# Patient Record
Sex: Female | Born: 1940
Health system: Southern US, Community
[De-identification: ages and names within clinical notes are randomized; demographics above are authoritative.]

## PROBLEM LIST (undated history)

## (undated) DIAGNOSIS — N189 Chronic kidney disease, unspecified: Secondary | ICD-10-CM

## (undated) DIAGNOSIS — B019 Varicella without complication: Secondary | ICD-10-CM

## (undated) DIAGNOSIS — E785 Hyperlipidemia, unspecified: Secondary | ICD-10-CM

## (undated) DIAGNOSIS — K635 Polyp of colon: Secondary | ICD-10-CM

## (undated) DIAGNOSIS — K219 Gastro-esophageal reflux disease without esophagitis: Secondary | ICD-10-CM

## (undated) DIAGNOSIS — I10 Essential (primary) hypertension: Secondary | ICD-10-CM

## (undated) DIAGNOSIS — E119 Type 2 diabetes mellitus without complications: Secondary | ICD-10-CM

## (undated) DIAGNOSIS — J45909 Unspecified asthma, uncomplicated: Secondary | ICD-10-CM

## (undated) DIAGNOSIS — K5790 Diverticulosis of intestine, part unspecified, without perforation or abscess without bleeding: Secondary | ICD-10-CM

## (undated) HISTORY — DX: Varicella without complication: B01.9

## (undated) HISTORY — DX: Essential (primary) hypertension: I10

## (undated) HISTORY — DX: Diverticulosis of intestine, part unspecified, without perforation or abscess without bleeding: K57.90

## (undated) HISTORY — PX: BLADDER SURGERY: SHX569

## (undated) HISTORY — DX: Polyp of colon: K63.5

## (undated) HISTORY — DX: Unspecified asthma, uncomplicated: J45.909

## (undated) HISTORY — DX: Type 2 diabetes mellitus without complications: E11.9

## (undated) HISTORY — DX: Hyperlipidemia, unspecified: E78.5

## (undated) HISTORY — PX: EYE SURGERY: SHX253

---

## 1978-03-26 HISTORY — PX: URETHRAL DIVERTICULUM REPAIR: SHX5148

## 2012-06-16 LAB — HM COLONOSCOPY

## 2013-01-14 ENCOUNTER — Emergency Department: Payer: Self-pay | Admitting: Emergency Medicine

## 2013-01-14 LAB — URINALYSIS, COMPLETE
Bacteria: NONE SEEN
Bilirubin,UR: NEGATIVE
Blood: NEGATIVE
Glucose,UR: NEGATIVE mg/dL (ref 0–75)
Ketone: NEGATIVE
Nitrite: NEGATIVE
RBC,UR: <1 /HPF (ref 0–5)
Squamous Epithelial: <1
WBC UR: 4 /HPF (ref 0–5)

## 2013-01-14 LAB — CSF CELL COUNT WITH DIFFERENTIAL
CSF Tube #: 3
Eosinophil: 0 %
Lymphocytes: 50 %
Monocytes/Macrophages: 44 %
Other Cells: 0 %
WBC (CSF): 27 /mm3

## 2013-01-14 LAB — CSF CELL CT + PROT + GLU PANEL
Glucose, CSF: 56 mg/dL (ref 40–75)
Monocytes/Macrophages: 54 %
RBC (CSF): 21 /mm3
WBC (CSF): 11 /mm3

## 2013-01-14 LAB — COMPREHENSIVE METABOLIC PANEL
Albumin: 4.2 g/dL (ref 3.4–5.0)
Alkaline Phosphatase: 97 U/L (ref 50–136)
Anion Gap: 7 (ref 7–16)
BUN: 23 mg/dL — ABNORMAL HIGH (ref 7–18)
Calcium, Total: 9.7 mg/dL (ref 8.5–10.1)
Co2: 22 mmol/L (ref 21–32)
Creatinine: 1.01 mg/dL (ref 0.60–1.30)
EGFR (African American): >60
EGFR (Non-African Amer.): 56 — ABNORMAL LOW
Glucose: 143 mg/dL — ABNORMAL HIGH (ref 65–99)
Osmolality: 280 (ref 275–301)
Potassium: 4.1 mmol/L (ref 3.5–5.1)
SGPT (ALT): 19 U/L (ref 12–78)
Sodium: 137 mmol/L (ref 136–145)

## 2013-01-14 LAB — CBC
MCH: 31.3 pg (ref 26.0–34.0)
MCV: 89 fL (ref 80–100)

## 2013-01-14 LAB — PROTIME-INR: INR: 0.9

## 2013-01-17 LAB — CSF CULTURE W GRAM STAIN

## 2013-02-06 ENCOUNTER — Ambulatory Visit: Payer: Self-pay | Admitting: Podiatry

## 2013-04-22 ENCOUNTER — Encounter: Payer: Self-pay | Admitting: *Deleted

## 2013-05-05 ENCOUNTER — Encounter: Payer: Self-pay | Admitting: General Surgery

## 2013-05-05 ENCOUNTER — Ambulatory Visit (INDEPENDENT_AMBULATORY_CARE_PROVIDER_SITE_OTHER): Payer: Medicare Other | Admitting: General Surgery

## 2013-05-05 VITALS — BP 127/78 | HR 68 | Resp 12 | Ht 60.0 in | Wt 127.0 lb

## 2013-05-05 DIAGNOSIS — D480 Neoplasm of uncertain behavior of bone and articular cartilage: Secondary | ICD-10-CM

## 2013-05-05 DIAGNOSIS — M898X8 Other specified disorders of bone, other site: Secondary | ICD-10-CM

## 2013-05-05 NOTE — Progress Notes (Signed)
Patient ID: Felicia Roberts, female   DOB: Feb 27, 1941, 73 y.o.   MRN: [id number]  Chief Complaint  Patient presents with  . Other    New patient evaluation of suprasternal mass    HPI Felicia Roberts is a 73 y.o. female who presents for an evaluation of a suprasternal mass. The patient states this mass has been there since she was a child. She states the area has gotten larger overtime. She denies any pain in this area. No studies have been done in this area.    HPI  Past Medical History  Diagnosis Date  . Hypertension   . Diabetes mellitus without complication   . Asthma   . Hyperlipidemia   . Diverticulosis   . Coronary artery disease     Past Surgical History  Procedure Laterality Date  . Bladder surgery      Family History  Problem Relation Age of Onset  . Cancer Father     prostate    Social History History  Substance Use Topics  . Smoking status: Never Smoker   . Smokeless tobacco: Never Used  . Alcohol Use: No    No Known Allergies  Current Outpatient Prescriptions  Medication Sig Dispense Refill  . amLODipine (NORVASC) 5 MG tablet Take 5 mg by mouth daily.      Marland Kitchen glyBURIDE (DIABETA) 5 MG tablet Take 5 mg by mouth 2 (two) times daily.      Marland Kitchen lisinopril (PRINIVIL,ZESTRIL) 40 MG tablet Take 40 mg by mouth daily.      Marland Kitchen METFORMIN HCL PO Take 500 mg by mouth 2 (two) times daily.       . pravastatin (PRAVACHOL) 40 MG tablet Take 40 mg by mouth daily.       No current facility-administered medications for this visit.    Review of Systems Review of Systems  Constitutional: Negative.   Respiratory: Negative.   Cardiovascular: Negative.     Blood pressure 127/78, pulse 68, resp. rate 12, height 5' (1.524 m), weight 127 lb (57.607 kg).  Physical Exam Physical Exam  Constitutional: She is oriented to person, place, and time. She appears well-developed and well-nourished.  Eyes: Conjunctivae are normal. No scleral icterus.  Neck: Neck supple. No  thyromegaly present.  Cardiovascular: Normal rate, regular rhythm and normal heart sounds.   No murmur heard. Pulmonary/Chest: Effort normal and breath sounds normal.  8 cm x 7cm bony mass involving the upper half of the body of the sternum. Markedly visible.   Abdominal: Soft. Normal appearance and bowel sounds are normal. There is no hepatosplenomegaly. There is no tenderness. No hernia.  Lymphadenopathy:    She has no cervical adenopathy.  Neurological: She is alert and oriented to person, place, and time.  Skin: Skin is warm and dry.    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 Patient appears to be asymptomatic from this.     Plan    Recommend a CT scan to assess this area.      Patient has been scheduled for a CT chest with contrast at [place] for 05-11-13 at 10:30 am (arrive 10 am). Prep: hold metformin day of scan and 48 hours after and take medication list. Patient verbalizes understanding.   SANKAR,SEEPLAPUTHUR G 05/05/2013, 12:53 PM

## 2013-05-05 NOTE — Patient Instructions (Addendum)
Patient to be scheduled for a chest CT scan.   Patient has been scheduled for a CT chest with contrast at [place] for 05-11-13 at 10:30 am (arrive 10 am). Prep: hold metformin day of scan and 48 hours after and take medication list. Patient verbalizes understanding.

## 2013-05-11 ENCOUNTER — Ambulatory Visit: Payer: Self-pay | Admitting: General Surgery

## 2013-05-13 ENCOUNTER — Telehealth: Payer: Self-pay | Admitting: General Surgery

## 2013-05-13 NOTE — Telephone Encounter (Signed)
Pt advised on CT chest report. No bony abnormality of sternum and no mediastinal mass. Pt does not need any further imaging for this . Pt voiced understanding. F/u here prn.

## 2013-07-08 ENCOUNTER — Ambulatory Visit: Payer: Self-pay | Admitting: Nurse Practitioner

## 2014-01-25 ENCOUNTER — Encounter: Payer: Self-pay | Admitting: General Surgery

## 2015-04-18 ENCOUNTER — Ambulatory Visit (INDEPENDENT_AMBULATORY_CARE_PROVIDER_SITE_OTHER): Payer: Medicare HMO | Admitting: Physician Assistant

## 2015-04-18 ENCOUNTER — Encounter: Payer: Self-pay | Admitting: Physician Assistant

## 2015-04-18 VITALS — BP 132/84 | HR 84 | Temp 98.7°F | Resp 16 | Ht 59.0 in | Wt 130.0 lb

## 2015-04-18 DIAGNOSIS — IMO0002 Reserved for concepts with insufficient information to code with codable children: Secondary | ICD-10-CM | POA: Insufficient documentation

## 2015-04-18 DIAGNOSIS — J4520 Mild intermittent asthma, uncomplicated: Secondary | ICD-10-CM | POA: Diagnosis not present

## 2015-04-18 DIAGNOSIS — E785 Hyperlipidemia, unspecified: Secondary | ICD-10-CM | POA: Insufficient documentation

## 2015-04-18 DIAGNOSIS — E7800 Pure hypercholesterolemia, unspecified: Secondary | ICD-10-CM

## 2015-04-18 DIAGNOSIS — I10 Essential (primary) hypertension: Secondary | ICD-10-CM

## 2015-04-18 DIAGNOSIS — E1129 Type 2 diabetes mellitus with other diabetic kidney complication: Secondary | ICD-10-CM | POA: Insufficient documentation

## 2015-04-18 DIAGNOSIS — R7989 Other specified abnormal findings of blood chemistry: Secondary | ICD-10-CM

## 2015-04-18 DIAGNOSIS — R946 Abnormal results of thyroid function studies: Secondary | ICD-10-CM | POA: Diagnosis not present

## 2015-04-18 DIAGNOSIS — E1165 Type 2 diabetes mellitus with hyperglycemia: Secondary | ICD-10-CM

## 2015-04-18 DIAGNOSIS — J45909 Unspecified asthma, uncomplicated: Secondary | ICD-10-CM | POA: Insufficient documentation

## 2015-04-18 DIAGNOSIS — E119 Type 2 diabetes mellitus without complications: Secondary | ICD-10-CM | POA: Diagnosis not present

## 2015-04-18 NOTE — Patient Instructions (Signed)

## 2015-04-18 NOTE — Progress Notes (Signed)
Patient ID: Felicia Roberts, female   DOB: [birth year], 75 y.o.   MRN: [id number]       Patient: Felicia Roberts Female    DOB: [birth year]   75 y.o.   MRN: [id number] Visit Date: 04/18/2015  Today's Provider: Mar Daring, PA-C   Chief Complaint  Patient presents with  . New Patient (Initial Visit)   Subjective:    HPI  Patient is here to establish care, pt reports she was being seen by [place] NP at [facility]. Patient reports she was taking Prednisone for 4 days last week. Patient reports she was given prednisone in [place] about 4 years ago.    Diabetes Mellitus Type II, Follow-up:   No results found for: HGBA1C  Last seen for diabetes 3 months ago.  Management since then includes A1c per pt. She reports excellent compliance with treatment. She is not having side effects.  Current symptoms include polyuria and have been stable. Home blood sugar records: fasting range: not being checked  Episodes of hypoglycemia? no   Current Insulin Regimen: none Most Recent Eye Exam: 10/2014 [facility] Weight trend: stable Prior visit with dietician: no Current diet: in general, a "healthy" diet   Current exercise: walking  Pertinent Labs:    Component Value Date/Time   CREATININE 1.01 01/14/2013 0942    Wt Readings from Last 3 Encounters:  04/18/15 130 lb (58.968 kg)  05/05/13 127 lb (57.607 kg)    ------------------------------------------------------------------------   Hypertension, follow-up:  BP Readings from Last 3 Encounters:  04/18/15 132/84  05/05/13 127/78    She was last seen for hypertension 3 months ago.  BP at that visit was. Management changes since that visit include none. She reports excellent compliance with treatment. She is not having side effects.  She is exercising. She is adherent to low salt diet.   Outside blood pressures are are not being checked. She is experiencing none.  Patient denies chest pain.   Cardiovascular  risk factors include diabetes mellitus.  Use of agents associated with hypertension: none.     Weight trend: stable Wt Readings from Last 3 Encounters:  04/18/15 130 lb (58.968 kg)  05/05/13 127 lb (57.607 kg)    Current diet: in general, a "healthy" diet    ------------------------------------------------------------------------    No Known Allergies Previous Medications   ALBUTEROL (PROVENTIL HFA;VENTOLIN HFA) 108 (90 BASE) MCG/ACT INHALER    Inhale 1-2 puffs into the lungs every 6 (six) hours as needed for wheezing or shortness of breath.   AMLODIPINE (NORVASC) 5 MG TABLET    Take 5 mg by mouth daily.   FLUTICASONE-SALMETEROL (ADVAIR) 500-50 MCG/DOSE AEPB    Inhale 1 puff into the lungs 2 (two) times daily.   GLYBURIDE (DIABETA) 5 MG TABLET    Take 5 mg by mouth 2 (two) times daily.   LINAGLIPTIN (TRADJENTA) 5 MG TABS TABLET    Take 5 mg by mouth daily.   LISINOPRIL (PRINIVIL,ZESTRIL) 20 MG TABLET    Take 20 mg by mouth daily.   PRAVASTATIN (PRAVACHOL) 40 MG TABLET    Take 40 mg by mouth daily.    Review of Systems  Constitutional: Negative.   HENT: Negative.   Eyes: Negative.   Respiratory: Negative.   Cardiovascular: Negative.   Gastrointestinal: Negative.   Endocrine: Negative.   Genitourinary: Negative.   Musculoskeletal: Negative.   Allergic/Immunologic: Negative.   Neurological: Negative.   Hematological: Negative.   Psychiatric/Behavioral: Negative.     Social History  Substance  Use Topics  . Smoking status: Never Smoker   . Smokeless tobacco: Never Used  . Alcohol Use: No   Objective:   BP 132/84 mmHg  Pulse 84  Temp(Src) 98.7 F (37.1 C) (Oral)  Resp 16  Ht 4\' 11"  (1.499 m)  Wt 130 lb (58.968 kg)  BMI 26.24 kg/m2  SpO2 98%  Physical Exam  Constitutional: She is oriented to person, place, and time. She appears well-developed and well-nourished. No distress.  HENT:  Head: Normocephalic and atraumatic.  Right Ear: External ear normal.  Left  Ear: External ear normal.  Nose: Nose normal.  Mouth/Throat: Oropharynx is clear and moist. No oropharyngeal exudate.  Eyes: Conjunctivae and EOM are normal. Pupils are equal, round, and reactive to light. Right eye exhibits no discharge. Left eye exhibits no discharge. No scleral icterus.  Neck: Normal range of motion. Neck supple. No JVD present. No tracheal deviation present. No thyromegaly present.  Cardiovascular: Normal rate, regular rhythm, normal heart sounds and intact distal pulses.  Exam reveals no gallop and no friction rub.   No murmur heard. Pulmonary/Chest: Effort normal and breath sounds normal. No respiratory distress. She has no wheezes. She has no rales. She exhibits no tenderness.  Abdominal: Soft. Bowel sounds are normal. She exhibits no distension and no mass. There is no tenderness. There is no rebound and no guarding.  Musculoskeletal: Normal range of motion. She exhibits no edema or tenderness.  Lymphadenopathy:    She has no cervical adenopathy.  Neurological: She is alert and oriented to person, place, and time.  Skin: Skin is warm and dry. No rash noted. She is not diaphoretic.  Psychiatric: She has a normal mood and affect. Her behavior is normal. Judgment and thought content normal.  Vitals reviewed.       Assessment & Plan:     1. Abnormal thyroid blood test She states that she was previously told she had abnormal thyroid tests and her doctor tried to start her on a medication. She has not been taking this medication regularly and would like to have her thyroid levels rechecked to see if this medication is necessary. I have ordered thyroid as below and will follow-up pending these lab results. She is to call the office if she has any issues in the meantime. Also I'll see her back in approximately 3 months for her complete physical exam. - TSH  2. Hypercholesterolemia She has been stable on pravastatin 40 mg but does admit to not taking this medication  regularly as she states she forgets to take it. I will check a cholesterol panel as below and follow-up pending results. In the meantime she is to continue her current medical treatment plan of pravastatin 40 mg. - Lipid panel  3. Type 2 diabetes mellitus without complication, without long-term current use of insulin (HCC) She states she has been stable on Tradjenta 5 mg and with Glyburide 5 mg. She states she was recently put on Tradjenta approximately 3 months ago. I will check her labs as below and follow-up pending lab results. She is to continue her current medical treatment as above unless otherwise noted once I receive her lab results. I will see her back in approximately 3 months for her complete physical exam. She is to call the office if she has any acute issues, questions or concerns in the meantime. - HgB A1c - Comprehensive Metabolic Panel (CMET) - CBC with Differential  4. Essential hypertension Currently stable on Norvasc 5 mg and lisinopril 20  mg. I will check labs as below and follow-up pending lab results. She is to continue her current medical treatment plan. - Comprehensive Metabolic Panel (CMET) - CBC with Differential  5. Asthma, mild intermittent, uncomplicated Currently controlled. She states she occasionally has exacerbations that required oral prednisone. She will take 10 mg as needed for her flares. She also has an albuterol inhaler for which she uses as needed. She is to call the office if she has any acute issue, exacerbation, question or concern.       Mar Daring, PA-C  [facility]

## 2015-04-19 DIAGNOSIS — I10 Essential (primary) hypertension: Secondary | ICD-10-CM | POA: Diagnosis not present

## 2015-04-19 DIAGNOSIS — E7800 Pure hypercholesterolemia, unspecified: Secondary | ICD-10-CM | POA: Diagnosis not present

## 2015-04-19 DIAGNOSIS — E119 Type 2 diabetes mellitus without complications: Secondary | ICD-10-CM | POA: Diagnosis not present

## 2015-04-19 DIAGNOSIS — R946 Abnormal results of thyroid function studies: Secondary | ICD-10-CM | POA: Diagnosis not present

## 2015-04-20 ENCOUNTER — Other Ambulatory Visit: Payer: Self-pay | Admitting: Physician Assistant

## 2015-04-20 ENCOUNTER — Telehealth: Payer: Self-pay

## 2015-04-20 DIAGNOSIS — I10 Essential (primary) hypertension: Secondary | ICD-10-CM

## 2015-04-20 DIAGNOSIS — J45909 Unspecified asthma, uncomplicated: Secondary | ICD-10-CM

## 2015-04-20 LAB — COMPREHENSIVE METABOLIC PANEL
ALBUMIN: 4.5 g/dL (ref 3.5–4.8)
ALK PHOS: 90 IU/L (ref 39–117)
ALT: 13 IU/L (ref 0–32)
AST: 20 IU/L (ref 0–40)
Albumin/Globulin Ratio: 1.9 (ref 1.1–2.5)
BILIRUBIN TOTAL: 1.3 mg/dL — AB (ref 0.0–1.2)
BUN / CREAT RATIO: 14 (ref 11–26)
BUN: 16 mg/dL (ref 8–27)
CHLORIDE: 99 mmol/L (ref 96–106)
CO2: 21 mmol/L (ref 18–29)
CREATININE: 1.14 mg/dL — AB (ref 0.57–1.00)
Calcium: 9.9 mg/dL (ref 8.7–10.3)
GFR calc Af Amer: 55 mL/min/{1.73_m2} — ABNORMAL LOW (ref >59)
GFR calc non Af Amer: 47 mL/min/{1.73_m2} — ABNORMAL LOW (ref >59)
GLUCOSE: 210 mg/dL — AB (ref 65–99)
Globulin, Total: 2.4 g/dL (ref 1.5–4.5)
Potassium: 4.9 mmol/L (ref 3.5–5.2)
Sodium: 136 mmol/L (ref 134–144)
Total Protein: 6.9 g/dL (ref 6.0–8.5)

## 2015-04-20 LAB — CBC WITH DIFFERENTIAL/PLATELET
BASOS ABS: 0.1 10*3/uL (ref 0.0–0.2)
Basos: 1 %
EOS (ABSOLUTE): 0.3 10*3/uL (ref 0.0–0.4)
EOS: 6 %
HEMOGLOBIN: 13.8 g/dL (ref 11.1–15.9)
Hematocrit: 41.7 % (ref 34.0–46.6)
IMMATURE GRANULOCYTES: 0 %
Immature Grans (Abs): 0 10*3/uL (ref 0.0–0.1)
LYMPHS ABS: 2.1 10*3/uL (ref 0.7–3.1)
Lymphs: 41 %
MCH: 30.1 pg (ref 26.6–33.0)
MCHC: 33.1 g/dL (ref 31.5–35.7)
MCV: 91 fL (ref 79–97)
MONOCYTES: 7 %
MONOS ABS: 0.4 10*3/uL (ref 0.1–0.9)
NEUTROS PCT: 45 %
Neutrophils Absolute: 2.3 10*3/uL (ref 1.4–7.0)
Platelets: 393 10*3/uL — ABNORMAL HIGH (ref 150–379)
RBC: 4.59 x10E6/uL (ref 3.77–5.28)
RDW: 13.2 % (ref 12.3–15.4)
WBC: 5.2 10*3/uL (ref 3.4–10.8)

## 2015-04-20 LAB — LIPID PANEL
CHOLESTEROL TOTAL: 215 mg/dL — AB (ref 100–199)
Chol/HDL Ratio: 2.9 ratio units (ref 0.0–4.4)
HDL: 74 mg/dL (ref >39)
LDL Calculated: 110 mg/dL — ABNORMAL HIGH (ref 0–99)
TRIGLYCERIDES: 154 mg/dL — AB (ref 0–149)
VLDL Cholesterol Cal: 31 mg/dL (ref 5–40)

## 2015-04-20 LAB — HEMOGLOBIN A1C
ESTIMATED AVERAGE GLUCOSE: 266 mg/dL
HEMOGLOBIN A1C: 10.9 % — AB (ref 4.8–5.6)

## 2015-04-20 LAB — TSH: TSH: 2.34 u[IU]/mL (ref 0.450–4.500)

## 2015-04-20 MED ORDER — AMLODIPINE BESYLATE 5 MG PO TABS: 5.0000 mg | ORAL_TABLET | Freq: Every day | ORAL | Status: DC

## 2015-04-20 MED ORDER — LISINOPRIL 20 MG PO TABS: 20.0000 mg | ORAL_TABLET | Freq: Every day | ORAL | Status: DC

## 2015-04-20 MED ORDER — ALBUTEROL SULFATE HFA 108 (90 BASE) MCG/ACT IN AERS: 1.0000 | INHALATION_SPRAY | Freq: Four times a day (QID) | RESPIRATORY_TRACT | Status: DC | PRN

## 2015-04-20 NOTE — Telephone Encounter (Signed)
Pt stated she forgot to ask for refills when she was here for her OV earlier this week. Pt would like the following RXs to be sent to [facility] on [address].   amLODipine (NORVASC) 5 MG tablet,lisinopril (PRINIVIL,ZESTRIL) 20 MG tablet,lisinopril (PRINIVIL,ZESTRIL) 20 MG tablet, & proair. Thanks TNP

## 2015-04-20 NOTE — Telephone Encounter (Signed)
Pt advised as below. Patient will schedule appt. sd

## 2015-04-20 NOTE — Telephone Encounter (Signed)
-----   Message from Mar Daring, [place] sent at 04/20/2015  2:08 PM EST ----- I would like for her to come back in to discuss her blood sugar results and options for treatment.  Thanks.

## 2015-04-26 ENCOUNTER — Ambulatory Visit (INDEPENDENT_AMBULATORY_CARE_PROVIDER_SITE_OTHER): Payer: Medicare HMO | Admitting: Physician Assistant

## 2015-04-26 ENCOUNTER — Encounter: Payer: Self-pay | Admitting: Physician Assistant

## 2015-04-26 VITALS — BP 120/70 | HR 81 | Temp 98.0°F | Resp 16 | Wt 130.6 lb

## 2015-04-26 DIAGNOSIS — J45909 Unspecified asthma, uncomplicated: Secondary | ICD-10-CM | POA: Diagnosis not present

## 2015-04-26 DIAGNOSIS — E119 Type 2 diabetes mellitus without complications: Secondary | ICD-10-CM | POA: Diagnosis not present

## 2015-04-26 DIAGNOSIS — I10 Essential (primary) hypertension: Secondary | ICD-10-CM

## 2015-04-26 MED ORDER — GLYBURIDE 5 MG PO TABS: 5.0000 mg | ORAL_TABLET | Freq: Two times a day (BID) | ORAL | Status: DC

## 2015-04-26 MED ORDER — ALBUTEROL SULFATE HFA 108 (90 BASE) MCG/ACT IN AERS: 1.0000 | INHALATION_SPRAY | Freq: Four times a day (QID) | RESPIRATORY_TRACT | Status: DC | PRN

## 2015-04-26 MED ORDER — SITAGLIPTIN PHOSPHATE 100 MG PO TABS: 100.0000 mg | ORAL_TABLET | Freq: Every day | ORAL | Status: DC

## 2015-04-26 MED ORDER — LISINOPRIL 20 MG PO TABS: 20.0000 mg | ORAL_TABLET | Freq: Every day | ORAL | Status: DC

## 2015-04-26 NOTE — Patient Instructions (Signed)
Sitagliptin oral tablet °What is this medicine? °SITAGLIPTIN (sit a GLIP tin) helps to treat type 2 diabetes. It helps to control blood sugar. Treatment is combined with diet and exercise. °This medicine may be used for other purposes; ask your health care provider or pharmacist if you have questions. °What should I tell my health care provider before I take this medicine? °They need to know if you have any of these conditions: °-diabetic ketoacidosis °-kidney disease °-pancreatitis °-previous swelling of the tongue, face, or lips with difficulty breathing, difficulty swallowing, hoarseness, or tightening of the throat °-type 1 diabetes °-an unusual or allergic reaction to sitagliptin, other medicines, foods, dyes, or preservatives °-pregnant or trying to get pregnant °-breast-feeding °How should I use this medicine? °Take this medicine by mouth with a glass of water. Follow the directions on the prescription label. You can take it with or without food. Do not cut, crush or chew this medicine. Take your dose at the same time each day. Do not take more often than directed. Do not stop taking except on your doctor's advice. °Talk to your pediatrician regarding the use of this medicine in children. Special care may be needed. °Overdosage: If you think you have taken too much of this medicine contact a poison control center or emergency room at once. °NOTE: This medicine is only for you. Do not share this medicine with others. °What if I miss a dose? °If you miss a dose, take it as soon as you can. If it is almost time for your next dose, take only that dose. Do not take double or extra doses. °What may interact with this medicine? °Do not take this medicine with any of the following medications: °-gatifloxacin °This medicine may also interact with the following medications: °-alcohol °-digoxin °-insulin °-sulfonylureas like glimepiride, glipizide, glyburide °This list may not describe all possible interactions. Give  your health care provider a list of all the medicines, herbs, non-prescription drugs, or dietary supplements you use. Also tell them if you smoke, drink alcohol, or use illegal drugs. Some items may interact with your medicine. °What should I watch for while using this medicine? °Visit your doctor or health care professional for regular checks on your progress. °A test called the HbA1C (A1C) will be monitored. This is a simple blood test. It measures your blood sugar control over the last 2 to 3 months. You will receive this test every 3 to 6 months. °Learn how to check your blood sugar. Learn the symptoms of low and high blood sugar and how to manage them. °Always carry a quick-source of sugar with you in case you have symptoms of low blood sugar. Examples include hard sugar candy or glucose tablets. Make sure others know that you can choke if you eat or drink when you develop serious symptoms of low blood sugar, such as seizures or unconsciousness. They must get medical help at once. °Tell your doctor or health care professional if you have high blood sugar. You might need to change the dose of your medicine. If you are sick or exercising more than usual, you might need to change the dose of your medicine. °Do not skip meals. Ask your doctor or health care professional if you should avoid alcohol. Many nonprescription cough and cold products contain sugar or alcohol. These can affect blood sugar. °Wear a medical ID bracelet or chain, and carry a card that describes your disease and details of your medicine and dosage times. °What side effects may I notice   from receiving this medicine? °Side effects that you should report to your doctor or health care professional as soon as possible: °-allergic reactions like skin rash, itching or hives, swelling of the face, lips, or tongue °-breathing problems °-fever, chills °-joint pain °-loss of appetite °-signs and symptoms of low blood sugar such as feeling anxious,  confusion, dizziness, increased hunger, unusually weak or tired, sweating, shakiness, cold, irritable, headache, blurred vision, fast heartbeat, loss of consciousness °-unusual stomach pain or discomfort °-vomiting °Side effects that usually do not require medical attention (report to your doctor or health care professional if they continue or are bothersome): °-diarrhea °-headache °-sore throat °-stomach upset °-stuffy or runny nose °This list may not describe all possible side effects. Call your doctor for medical advice about side effects. You may report side effects to FDA at 1-800-FDA-1088. °Where should I keep my medicine? °Keep out of the reach of children. °Store at room temperature between 15 and 30 degrees C (59 and 86 degrees F). Throw away any unused medicine after the expiration date. °NOTE: This sheet is a summary. It may not cover all possible information. If you have questions about this medicine, talk to your doctor, pharmacist, or health care provider. °  °© 2016, [employer]vier/[employer]. (2013-11-20 17:46:21) ° °

## 2015-04-26 NOTE — Progress Notes (Signed)
Patient: Felicia Roberts Female    DOB: February 01, 1941   75 y.o.   MRN: [id number] Visit Date: 04/26/2015  Today's Provider: Mar Daring, PA-C   Chief Complaint  Patient presents with  . Diabetes   Subjective:    HPI  Felicia Roberts is here to follow-up on her labs results and to discuss treatments options for her diabetes. Her Hgb A1C came back at 10.9. She states that she has been taking her glucuronide 5 mg twice daily. She was also previously given Tradjenta 5 mg which she says she has not been taking regularly as it was too expensive. So states that she has not been adhering to a regular diet and is going to start trying to eat healthier.     No Known Allergies Previous Medications   ALBUTEROL (PROVENTIL HFA;VENTOLIN HFA) 108 (90 BASE) MCG/ACT INHALER    Inhale 1-2 puffs into the lungs every 6 (six) hours as needed for wheezing or shortness of breath.   AMLODIPINE (NORVASC) 5 MG TABLET    Take 1 tablet (5 mg total) by mouth daily.   FLUTICASONE-SALMETEROL (ADVAIR) 500-50 MCG/DOSE AEPB    Inhale 1 puff into the lungs 2 (two) times daily.   GLYBURIDE (DIABETA) 5 MG TABLET    Take 5 mg by mouth 2 (two) times daily.   LINAGLIPTIN (TRADJENTA) 5 MG TABS TABLET    Take 5 mg by mouth daily.   LISINOPRIL (PRINIVIL,ZESTRIL) 20 MG TABLET    Take 1 tablet (20 mg total) by mouth daily.   PRAVASTATIN (PRAVACHOL) 40 MG TABLET    Take 40 mg by mouth daily.   PREDNISONE (DELTASONE) 10 MG TABLET    Take 10 mg by mouth as needed.    Review of Systems  Constitutional: Negative for fever, chills and fatigue.  Eyes: Negative for visual disturbance.  Respiratory: Negative.   Cardiovascular: Negative for chest pain, palpitations and leg swelling.  Gastrointestinal: Negative.   Endocrine: Negative for polydipsia, polyphagia and polyuria.  Genitourinary: Positive for frequency. Negative for dysuria, urgency, hematuria and flank pain.  Neurological: Negative for dizziness, weakness and  headaches.    Social History  Substance Use Topics  . Smoking status: Never Smoker   . Smokeless tobacco: Never Used  . Alcohol Use: No   Objective:   BP 120/70 mmHg  Pulse 81  Temp(Src) 98 F (36.7 C) (Oral)  Resp 16  Wt 130 lb 9.6 oz (59.24 kg)  Physical Exam  Constitutional: She appears well-developed and well-nourished. No distress.  Cardiovascular: Normal rate, regular rhythm and normal heart sounds.  Exam reveals no gallop and no friction rub.   No murmur heard. Pulmonary/Chest: Effort normal and breath sounds normal. No respiratory distress. She has no wheezes. She has no rales.  Skin: She is not diaphoretic.  Vitals reviewed.       Assessment & Plan:     1. Type 2 diabetes mellitus without complication, without long-term current use of insulin ([place]) She states that she has not been taking her Tradjenta because it was too expensive. She also states that she had been drinking sodas which she is now cut out. We discussed diabetic diet and limiting sugars and carbohydrates in detail. She states she understands. I will refill her glyburide as below and she states she ran out of it last night. I also will discontinue her Tradjenta since she is not taking it anyways. I will prescribe Januvia as below. I also gave her four-week  supply of samples. She is to return to the office in 3 months for her complete physical exam and we will also recheck her hemoglobin A1c in the office at that time as well. - sitaGLIPtin (JANUVIA) 100 MG tablet; Take 1 tablet (100 mg total) by mouth daily.  Dispense: 90 tablet; Refill: 3 - glyBURIDE (DIABETA) 5 MG tablet; Take 1 tablet (5 mg total) by mouth 2 (two) times daily.  Dispense: 180 tablet; Refill: 3  2. Essential hypertension Stable. Diagnosis was pulled to refill lisinopril. She is to continue her current medical treatment plan of lisinopril and amlodipine. - lisinopril (PRINIVIL,ZESTRIL) 20 MG tablet; Take 1 tablet (20 mg total) by mouth  daily.  Dispense: 90 tablet; Refill: 3  3. Asthma, unspecified asthma severity, uncomplicated Stable. Diagnosis was pulled to refill medication as below. - albuterol (PROVENTIL HFA;VENTOLIN HFA) 108 (90 Base) MCG/ACT inhaler; Inhale 1-2 puffs into the lungs every 6 (six) hours as needed for wheezing or shortness of breath.  Dispense: 1 Inhaler; Refill: [place], PA-C  [facility]

## 2015-05-06 ENCOUNTER — Telehealth: Payer: Self-pay

## 2015-05-06 DIAGNOSIS — E039 Hypothyroidism, unspecified: Secondary | ICD-10-CM | POA: Diagnosis not present

## 2015-05-06 DIAGNOSIS — I129 Hypertensive chronic kidney disease with stage 1 through stage 4 chronic kidney disease, or unspecified chronic kidney disease: Secondary | ICD-10-CM | POA: Diagnosis not present

## 2015-05-06 DIAGNOSIS — N183 Chronic kidney disease, stage 3 (moderate): Secondary | ICD-10-CM | POA: Diagnosis not present

## 2015-05-06 DIAGNOSIS — Z6824 Body mass index (BMI) 24.0-24.9, adult: Secondary | ICD-10-CM | POA: Diagnosis not present

## 2015-05-06 DIAGNOSIS — Z7951 Long term (current) use of inhaled steroids: Secondary | ICD-10-CM | POA: Diagnosis not present

## 2015-05-06 DIAGNOSIS — Z7952 Long term (current) use of systemic steroids: Secondary | ICD-10-CM | POA: Diagnosis not present

## 2015-05-06 DIAGNOSIS — J45909 Unspecified asthma, uncomplicated: Secondary | ICD-10-CM | POA: Diagnosis not present

## 2015-05-06 DIAGNOSIS — E785 Hyperlipidemia, unspecified: Secondary | ICD-10-CM | POA: Diagnosis not present

## 2015-05-06 DIAGNOSIS — Z809 Family history of malignant neoplasm, unspecified: Secondary | ICD-10-CM | POA: Diagnosis not present

## 2015-05-06 DIAGNOSIS — E118 Type 2 diabetes mellitus with unspecified complications: Secondary | ICD-10-CM | POA: Diagnosis not present

## 2015-05-06 NOTE — Telephone Encounter (Signed)
Dr. Levada Dy a provider from her insurance co reports that patient had a reaction to the [place]. She advised the patient to not take anymore of it until she has been evaluated buy PCP. Appt was scheduled on 05/09/15 with Tawanna Sat. She reports that patient had developed a rash all over her body and even inside her mouth. She denies any difficulty breathing. Patient is to monitor her Blood sugar and bring readings into OV. Advised if rash worsens that she go to ER for evaluation.

## 2015-05-09 ENCOUNTER — Encounter: Payer: Self-pay | Admitting: Physician Assistant

## 2015-05-09 ENCOUNTER — Ambulatory Visit (INDEPENDENT_AMBULATORY_CARE_PROVIDER_SITE_OTHER): Payer: Medicare HMO | Admitting: Physician Assistant

## 2015-05-09 VITALS — BP 160/80 | HR 77 | Temp 97.6°F | Resp 16 | Wt 128.8 lb

## 2015-05-09 DIAGNOSIS — J45909 Unspecified asthma, uncomplicated: Secondary | ICD-10-CM

## 2015-05-09 DIAGNOSIS — E119 Type 2 diabetes mellitus without complications: Secondary | ICD-10-CM

## 2015-05-09 MED ORDER — FLUTICASONE-SALMETEROL 500-50 MCG/DOSE IN AEPB: 1.0000 | INHALATION_SPRAY | Freq: Two times a day (BID) | RESPIRATORY_TRACT | Status: DC

## 2015-05-09 MED ORDER — ALBUTEROL SULFATE HFA 108 (90 BASE) MCG/ACT IN AERS: 1.0000 | INHALATION_SPRAY | Freq: Four times a day (QID) | RESPIRATORY_TRACT | Status: DC | PRN

## 2015-05-09 MED ORDER — DAPAGLIFLOZIN PROPANEDIOL 10 MG PO TABS: 10.0000 mg | ORAL_TABLET | Freq: Every day | ORAL | Status: DC

## 2015-05-09 NOTE — Patient Instructions (Signed)
Dapagliflozin tablets What is this medicine? DAPAGLIFLOZIN (DAP a gli FLOE zin) helps to treat type 2 diabetes. It helps to control blood sugar. Treatment is combined with diet and exercise. This medicine may be used for other purposes; ask your health care provider or pharmacist if you have questions. What should I tell my health care provider before I take this medicine? They need to know if you have any of these conditions: -bladder cancer -dehydration -diabetic ketoacidosis -diet low in salt -eating less due to illness, surgery, dieting, or any other reason -having surgery -high cholesterol -history of pancreatitis or pancreas problems -history of yeast infection of the penis or vagina -if you often drink alcohol -infections in the bladder, kidneys, or urinary tract -kidney disease -low blood pressure -on hemodialysis -problems urinating -type 1 diabetes -uncircumcised female -an unusual or allergic reaction to dapagliflozin, other medicines, foods, dyes, or preservatives -pregnant or trying to get pregnant -breast-feeding How should I use this medicine? Take this medicine by mouth with a glass of water. Follow the directions on the prescription label. You can take it with or without food. If it upsets your stomach, take it with food. Take this medicine in the morning. Take your dose at the same time each day. Do not take more often than directed. Do not stop taking except on your doctor's advice. A special MedGuide will be given to you by the pharmacist with each prescription and refill. Be sure to read this information carefully each time. Talk to your pediatrician regarding the use of this medicine in children. Special care may be needed. Overdosage: If you think you have taken too much of this medicine contact a poison control center or emergency room at once. NOTE: This medicine is only for you. Do not share this medicine with others. What if I miss a dose? If you miss a dose,  take it as soon as you can. If it is almost time for your next dose, take only that dose. Do not take double or extra doses. What may interact with this medicine? Do not take this medicine with any of the following medications: -gatifloxacinThis medicine may also interact with the following medications: -alcohol -certain medicines for blood pressure, heart disease -diuretics -insulin -nateglinide -pioglitazone -quinolone antibiotics like ciprofloxacin, levofloxacin, ofloxacin -repaglinide -some herbal dietary supplements -steroid medicines like prednisone or cortisone -sulfonylureas like glimepiride, glipizide, glyburide -thyroid medicine This list may not describe all possible interactions. Give your health care provider a list of all the medicines, herbs, non-prescription drugs, or dietary supplements you use. Also tell them if you smoke, drink alcohol, or use illegal drugs. Some items may interact with your medicine. What should I watch for while using this medicine? Visit your doctor or health care professional for regular checks on your progress. This medicine can cause a serious condition in which there is too much acid in the blood. If you develop nausea, vomiting, stomach pain, unusual tiredness, or breathing problems, stop taking this medicine and call your doctor right away. If possible, use a ketone dipstick to check for ketones in your urine. A test called the HbA1C (A1C) will be monitored. This is a simple blood test. It measures your blood sugar control over the last 2 to 3 months. You will receive this test every 3 to 6 months. Learn how to check your blood sugar. Learn the symptoms of low and high blood sugar and how to manage them. Always carry a quick-source of sugar with you in case you   have symptoms of low blood sugar. Examples include hard sugar candy or glucose tablets. Make sure others know that you can choke if you eat or drink when you develop serious symptoms of low  blood sugar, such as seizures or unconsciousness. They must get medical help at once. Tell your doctor or health care professional if you have high blood sugar. You might need to change the dose of your medicine. If you are sick or exercising more than usual, you might need to change the dose of your medicine. Do not skip meals. Ask your doctor or health care professional if you should avoid alcohol. Many nonprescription cough and cold products contain sugar or alcohol. These can affect blood sugar. Wear a medical ID bracelet or chain, and carry a card that describes your disease and details of your medicine and dosage times. What side effects may I notice from receiving this medicine? Side effects that you should report to your doctor or health care professional as soon as possible: -allergic reactions like skin rash, itching or hives, swelling of the face, lips, or tongue -breathing problems -dizziness -feeling faint or lightheaded, falls -muscle weakness -nausea, vomiting, unusual stomach upset or pain -signs and symptoms of low blood sugar such as feeling anxious, confusion, dizziness, increased hunger, unusually weak or tired, sweating, shakiness, cold, irritable, headache, blurred vision, fast heartbeat, loss of consciousness -signs and symptoms of a urinary tract infection, such as fever, chills, a burning feeling when urinating, blood in the urine, back pain -trouble passing urine or change in the amount of urine, including an urgent need to urinate more often, in larger amounts, or at night -penile discharge, itching, or pain in men -unusual tiredness -vaginal discharge, itching, or odor in women Side effects that usually do not require medical attention (Report these to your doctor or health care professional if they continue or are bothersome.): -constipation -mild increase in urination -stuffy or runny nose -sore throat -thirsty This list may not describe all possible side  effects. Call your doctor for medical advice about side effects. You may report side effects to FDA at 1-800-FDA-1088. Where should I keep my medicine? Keep out of the reach of children. Store at room temperature between 15 and 30 degrees C (59 and 86 degrees F). Throw away any unused medicine after the expiration date. NOTE: This sheet is a summary. It may not cover all possible information. If you have questions about this medicine, talk to your doctor, pharmacist, or health care provider.    2016, Elsevier/Gold Standard. (2014-03-02 11:14:42)  

## 2015-05-09 NOTE — Progress Notes (Signed)
Patient: Felicia Roberts Female    DOB: [birth year]   75 y.o.   MRN: [id number] Visit Date: 05/09/2015  Today's Provider: Mar Daring, PA-C   Chief Complaint  Patient presents with  . Rash   Subjective:    Rash This is a new (Nurse was viisting the patient on Friday and notice the rash and called to scheduled an appointment and told patient to stop the Januvia.) problem. Episode onset: Itching started the thrid day after Starting the Januvia. The problem has been gradually improving since onset. The affected locations include the left hand and right hand (Per patient on Friday the 8th the rash was all over and redness. Very ithing and ha numbness on her tongue.). The rash is characterized by itchiness, redness and swelling (Swelling was on her lips). She was exposed to a new medication. Past treatments include topical steroids and antihistamine (Only did Hydrocortisone, and home remedy. and Benadryl). The treatment provided moderate relief.       No Known Allergies Previous Medications   ALBUTEROL (PROVENTIL HFA;VENTOLIN HFA) 108 (90 BASE) MCG/ACT INHALER    Inhale 1-2 puffs into the lungs every 6 (six) hours as needed for wheezing or shortness of breath.   AMLODIPINE (NORVASC) 5 MG TABLET    Take 1 tablet (5 mg total) by mouth daily.   FLUTICASONE-SALMETEROL (ADVAIR) 500-50 MCG/DOSE AEPB    Inhale 1 puff into the lungs 2 (two) times daily.   GLYBURIDE (DIABETA) 5 MG TABLET    Take 1 tablet (5 mg total) by mouth 2 (two) times daily.   LISINOPRIL (PRINIVIL,ZESTRIL) 20 MG TABLET    Take 1 tablet (20 mg total) by mouth daily.   PRAVASTATIN (PRAVACHOL) 40 MG TABLET    Take 40 mg by mouth daily.   PREDNISONE (DELTASONE) 10 MG TABLET    Take 10 mg by mouth as needed.   SITAGLIPTIN (JANUVIA) 100 MG TABLET    Take 1 tablet (100 mg total) by mouth daily.    Review of Systems  Constitutional: Negative.   HENT: Negative.   Eyes: Negative.   Respiratory: Negative.     Cardiovascular: Negative.   Gastrointestinal: Negative.   Endocrine: Negative.   Genitourinary: Negative.   Musculoskeletal: Negative.   Skin: Positive for rash.  Allergic/Immunologic: Negative.   Neurological: Negative.   Hematological: Negative.   Psychiatric/Behavioral: Negative.     Social History  Substance Use Topics  . Smoking status: Never Smoker   . Smokeless tobacco: Never Used  . Alcohol Use: No   Objective:   BP 160/80 mmHg  Pulse 77  Temp(Src) 97.6 F (36.4 C) (Oral)  Resp 16  Wt 128 lb 12.8 oz (58.423 kg)  Physical Exam  Constitutional: She appears well-developed and well-nourished. No distress.  Neck: Normal range of motion. Neck supple. No JVD present. No tracheal deviation present. No thyromegaly present.  Cardiovascular: Normal rate, regular rhythm and normal heart sounds.  Exam reveals no gallop and no friction rub.   No murmur heard. Pulmonary/Chest: Effort normal and breath sounds normal. No respiratory distress. She has no wheezes. She has no rales.  Lymphadenopathy:    She has no cervical adenopathy.  Skin: Skin is warm and dry. No rash noted. She is not diaphoretic.  Vitals reviewed.       Assessment & Plan:     1. Asthma, unspecified asthma severity, uncomplicated Stable. Medication pulled for refill. Continue current medical treatment plan. - Fluticasone-Salmeterol (ADVAIR) 500-50 MCG/DOSE AEPB;  Inhale 1 puff into the lungs 2 (two) times daily.  Dispense: 60 each; Refill: 6 - albuterol (PROAIR HFA) 108 (90 Base) MCG/ACT inhaler; Inhale 1-2 puffs into the lungs every 6 (six) hours as needed for wheezing or shortness of breath.  Dispense: 18 g; Refill: 6  2. Type 2 diabetes mellitus without complication, without long-term current use of insulin (HCC) Had allergic reaction to [place] but had tolerated tradjenta in the past so I do not know if it is a class wide allergy or specific medication, but the latter is suspected. Will switch to farxiga  to see if better control of HgBA1c is obtained.  I will see her back in 4 weeks if medication tolerated for a recheck of her HgBA1c.  She is to call the office if she has any adverse reactions, acute issue, questions or concerns. - dapagliflozin propanediol (FARXIGA) 10 MG TABS tablet; Take 10 mg by mouth daily.  Dispense: 30 tablet; Refill: 0       Mar Daring, PA-C  [facility]

## 2015-06-06 ENCOUNTER — Ambulatory Visit: Payer: Medicare HMO | Admitting: Physician Assistant

## 2015-06-06 ENCOUNTER — Ambulatory Visit (INDEPENDENT_AMBULATORY_CARE_PROVIDER_SITE_OTHER): Payer: Medicare HMO | Admitting: Physician Assistant

## 2015-06-06 ENCOUNTER — Encounter: Payer: Self-pay | Admitting: Physician Assistant

## 2015-06-06 VITALS — BP 122/70 | HR 78 | Temp 97.8°F | Resp 17 | Wt 124.8 lb

## 2015-06-06 DIAGNOSIS — E119 Type 2 diabetes mellitus without complications: Secondary | ICD-10-CM

## 2015-06-06 DIAGNOSIS — J40 Bronchitis, not specified as acute or chronic: Secondary | ICD-10-CM | POA: Diagnosis not present

## 2015-06-06 LAB — POCT GLYCOSYLATED HEMOGLOBIN (HGB A1C)
ESTIMATED AVERAGE GLUCOSE: 206
HEMOGLOBIN A1C: 8.8

## 2015-06-06 MED ORDER — DAPAGLIFLOZIN PROPANEDIOL 10 MG PO TABS: 10.0000 mg | ORAL_TABLET | Freq: Every day | ORAL | Status: DC

## 2015-06-06 MED ORDER — PREDNISONE 10 MG PO TABS: 10.0000 mg | ORAL_TABLET | Freq: Every day | ORAL | Status: DC

## 2015-06-06 NOTE — Progress Notes (Signed)
Patient: Felicia Roberts Female    DOB: 1941/01/11   75 y.o.   MRN: [id number] Visit Date: 06/06/2015  Today's Provider: Mar Daring, PA-C   Chief Complaint  Patient presents with  . Follow-up    Diabetes  . Cough   Subjective:    HPI Diabetes Mellitus Type II, Follow-up: Patient here for follow-up of Type 2 diabetes mellitus.  Current symptoms/problems include just goes to the bathroom frequently and have been improving with the [place].   Cardiovascular risk factors: advanced age (older than 39 for men, 92 for women) Current diabetic medications include [place] and Diabeta.   Eye exam current (within one year): unknown She states she needs one. Weight trend: fluctuating a bit. She said she has loss weight because she change her diet. Prior visit with dietician: no Current diet:She eats everything, just small portions. Current exercise: cardiovascular workout on exercise equipment  Current monitoring regimen: home blood tests - daily before breakfast. Home blood sugar records: This weeks readings on 3/07: 102, 03/10: 103, 03/11:139, 03/12: 95 and Today 105 Fasting. Any episodes of hypoglycemia? no  Asthma Exacerbation: She has previously been evaluated here for asthma and now presents with an asthma exacerbation. Her new asthma flare up started Wednesday 03/08.  Associated symptoms include nasal congestion, productive cough and productive cough with sputum described as yellow. Symptoms have been gradually worsening since their onset. She has treated this current exacerbation with short-acting inhaled beta-adrenergic agonists and ProAir inhaler.  She feels her chest is tight,SOB, and Wheezing.     Allergies  Allergen Reactions  . Januvia [Sitagliptin] Rash   Previous Medications   ALBUTEROL (PROAIR HFA) 108 (90 BASE) MCG/ACT INHALER    Inhale 1-2 puffs into the lungs every 6 (six) hours as needed for wheezing or shortness of breath.   AMLODIPINE (NORVASC) 5 MG  TABLET    Take 1 tablet (5 mg total) by mouth daily.   DAPAGLIFLOZIN PROPANEDIOL (FARXIGA) 10 MG TABS TABLET    Take 10 mg by mouth daily.   FLUTICASONE-SALMETEROL (ADVAIR) 500-50 MCG/DOSE AEPB    Inhale 1 puff into the lungs 2 (two) times daily.   GLYBURIDE (DIABETA) 5 MG TABLET    Take 1 tablet (5 mg total) by mouth 2 (two) times daily.   LISINOPRIL (PRINIVIL,ZESTRIL) 20 MG TABLET    Take 1 tablet (20 mg total) by mouth daily.   PRAVASTATIN (PRAVACHOL) 40 MG TABLET    Take 40 mg by mouth daily. Reported on 06/06/2015   PREDNISONE (DELTASONE) 10 MG TABLET    Take 10 mg by mouth as needed. Reported on 06/06/2015    Review of Systems  Constitutional: Negative for fever, chills and fatigue.  HENT: Positive for congestion, postnasal drip and rhinorrhea. Negative for ear pain, sinus pressure, sneezing and sore throat.   Respiratory: Positive for cough, chest tightness, shortness of breath and wheezing.   Cardiovascular: Negative for chest pain, palpitations and leg swelling.  Gastrointestinal: Negative for nausea, vomiting and abdominal pain.  Endocrine: Negative for cold intolerance, heat intolerance and polydipsia.  Genitourinary: Positive for frequency.  Neurological: Negative for dizziness and headaches.    Social History  Substance Use Topics  . Smoking status: Never Smoker   . Smokeless tobacco: Never Used  . Alcohol Use: No   Objective:   BP 122/70 mmHg  Pulse 78  Temp(Src) 97.8 F (36.6 C) (Oral)  Resp 17  Wt 124 lb 12.8 oz (56.609 kg)  SpO2 97%  Physical Exam  Constitutional: She appears well-developed and well-nourished. No distress.  HENT:  Head: Normocephalic and atraumatic.  Right Ear: Hearing, tympanic membrane, external ear and ear canal normal.  Left Ear: Hearing, tympanic membrane, external ear and ear canal normal.  Nose: Mucosal edema and rhinorrhea present. Right sinus exhibits no maxillary sinus tenderness and no frontal sinus tenderness. Left sinus exhibits no  maxillary sinus tenderness and no frontal sinus tenderness.  Mouth/Throat: Uvula is midline, oropharynx is clear and moist and mucous membranes are normal. No oropharyngeal exudate, posterior oropharyngeal edema or posterior oropharyngeal erythema.  Eyes: Conjunctivae are normal. Pupils are equal, round, and reactive to light. Right eye exhibits no discharge. Left eye exhibits no discharge. No scleral icterus.  Neck: Normal range of motion. Neck supple. No tracheal deviation present. No thyromegaly present.  Cardiovascular: Normal rate, regular rhythm and normal heart sounds.  Exam reveals no gallop and no friction rub.   No murmur heard. Pulmonary/Chest: Effort normal. No stridor. No respiratory distress. She has no decreased breath sounds. She has wheezes (expiratory throughout). She has no rhonchi. She has rales.  Lymphadenopathy:    She has no cervical adenopathy.  Skin: Skin is warm and dry. She is not diaphoretic.  Vitals reviewed.       Assessment & Plan:     1. Type 2 diabetes mellitus without complication, without long-term current use of insulin (HCC) HgBA1c improved to 8.8 with farxiga. Continue diet and exercise plus farxiga and diabeta as directed. Will see her back in 3 months for recheck.  She is to call if she has any worsening symptoms, acute issue, question or concern. - POCT HgB A1C - dapagliflozin propanediol (FARXIGA) 10 MG TABS tablet; Take 10 mg by mouth daily.  Dispense: 30 tablet; Refill: 6  2. Bronchitis Worsening wheezing that is not responding to her advair or albuterol inhaler.  She states she has been using her albuterol inhaler approx. 4 times per day. Will add prednisone as below.  Advised to monitor sugars and diet closely while taking prednisone. She is to call the office if symptoms fail to improve or worsen. - predniSONE (DELTASONE) 10 MG tablet; Take 1 tablet (10 mg total) by mouth daily with breakfast.  Dispense: 10 tablet; Refill: 0       Mar Daring, PA-C  [facility]

## 2015-06-06 NOTE — Patient Instructions (Signed)
Asthma, Adult Asthma is a recurring condition in which the airways tighten and narrow. Asthma can make it difficult to breathe. It can cause coughing, wheezing, and shortness of breath. Asthma episodes, also called asthma attacks, range from minor to life-threatening. Asthma cannot be cured, but medicines and lifestyle changes can help control it. CAUSES Asthma is believed to be caused by inherited (genetic) and environmental factors, but its exact cause is unknown. Asthma may be triggered by allergens, lung infections, or irritants in the air. Asthma triggers are different for each person. Common triggers include:   Animal dander.  Dust mites.  Cockroaches.  Pollen from trees or grass.  Mold.  Smoke.  Air pollutants such as dust, household cleaners, hair sprays, aerosol sprays, paint fumes, strong chemicals, or strong odors.  Cold air, weather changes, and winds (which increase molds and pollens in the air).  Strong emotional expressions such as crying or laughing hard.  Stress.  Certain medicines (such as aspirin) or types of drugs (such as beta-blockers).  Sulfites in foods and drinks. Foods and drinks that may contain sulfites include dried fruit, potato chips, and sparkling grape juice.  Infections or inflammatory conditions such as the flu, a cold, or an inflammation of the nasal membranes (rhinitis).  Gastroesophageal reflux disease (GERD).  Exercise or strenuous activity. SYMPTOMS Symptoms may occur immediately after asthma is triggered or many hours later. Symptoms include:  Wheezing.  Excessive nighttime or early morning coughing.  Frequent or severe coughing with a common cold.  Chest tightness.  Shortness of breath. DIAGNOSIS  The diagnosis of asthma is made by a review of your medical history and a physical exam. Tests may also be performed. These may include:  Lung function studies. These tests show how much air you breathe in and out.  Allergy  tests.  Imaging tests such as X-rays. TREATMENT  Asthma cannot be cured, but it can usually be controlled. Treatment involves identifying and avoiding your asthma triggers. It also involves medicines. There are 2 classes of medicine used for asthma treatment:   Controller medicines. These prevent asthma symptoms from occurring. They are usually taken every day.  Reliever or rescue medicines. These quickly relieve asthma symptoms. They are used as needed and provide short-term relief. Your health care provider will help you create an asthma action plan. An asthma action plan is a written plan for managing and treating your asthma attacks. It includes a list of your asthma triggers and how they may be avoided. It also includes information on when medicines should be taken and when their dosage should be changed. An action plan may also involve the use of a device called a peak flow meter. A peak flow meter measures how well the lungs are working. It helps you monitor your condition. HOME CARE INSTRUCTIONS   Take medicines only as directed by your health care provider. Speak with your health care provider if you have questions about how or when to take the medicines.  Use a peak flow meter as directed by your health care provider. Record and keep track of readings.  Understand and use the action plan to help minimize or stop an asthma attack without needing to seek medical care.  Control your home environment in the following ways to help prevent asthma attacks:  Do not smoke. Avoid being exposed to secondhand smoke.  Change your heating and air conditioning filter regularly.  Limit your use of fireplaces and wood stoves.  Get rid of pests (such as roaches   and mice) and their droppings.  Throw away plants if you see mold on them.  Clean your floors and dust regularly. Use unscented cleaning products.  Try to have someone else vacuum for you regularly. Stay out of rooms while they are  being vacuumed and for a short while afterward. If you vacuum, use a dust mask from a hardware store, a double-layered or microfilter vacuum cleaner bag, or a vacuum cleaner with a HEPA filter.  Replace carpet with wood, tile, or vinyl flooring. Carpet can trap dander and dust.  Use allergy-proof pillows, mattress covers, and box spring covers.  Wash bed sheets and blankets every week in hot water and dry them in a dryer.  Use blankets that are made of polyester or cotton.  Clean bathrooms and kitchens with bleach. If possible, have someone repaint the walls in these rooms with mold-resistant paint. Keep out of the rooms that are being cleaned and painted.  Wash hands frequently. SEEK MEDICAL CARE IF:   You have wheezing, shortness of breath, or a cough even if taking medicine to prevent attacks.  The colored mucus you cough up (sputum) is thicker than usual.  Your sputum changes from clear or white to yellow, green, gray, or bloody.  You have any problems that may be related to the medicines you are taking (such as a rash, itching, swelling, or trouble breathing).  You are using a reliever medicine more than 2-3 times per week.  Your peak flow is still at 50-79% of your personal best after following your action plan for 1 hour.  You have a fever. SEEK IMMEDIATE MEDICAL CARE IF:   You seem to be getting worse and are unresponsive to treatment during an asthma attack.  You are short of breath even at rest.  You get short of breath when doing very little physical activity.  You have difficulty eating, drinking, or talking due to asthma symptoms.  You develop chest pain.  You develop a fast heartbeat.  You have a bluish color to your lips or fingernails.  You are light-headed, dizzy, or faint.  Your peak flow is less than 50% of your personal best.   This information is not intended to replace advice given to you by your health care provider. Make sure you discuss any  questions you have with your health care provider.   Document Released: 03/12/2005 Document Revised: 12/01/2014 Document Reviewed: 10/09/2012 Elsevier Interactive Patient Education 2016 Elsevier Inc.  

## 2015-06-17 ENCOUNTER — Ambulatory Visit (INDEPENDENT_AMBULATORY_CARE_PROVIDER_SITE_OTHER): Payer: Medicare HMO | Admitting: Family Medicine

## 2015-06-17 ENCOUNTER — Encounter: Payer: Self-pay | Admitting: Family Medicine

## 2015-06-17 VITALS — BP 126/82 | HR 70 | Temp 98.1°F | Ht 60.0 in | Wt 123.5 lb

## 2015-06-17 DIAGNOSIS — E2839 Other primary ovarian failure: Secondary | ICD-10-CM

## 2015-06-17 DIAGNOSIS — K5900 Constipation, unspecified: Secondary | ICD-10-CM

## 2015-06-17 DIAGNOSIS — E1122 Type 2 diabetes mellitus with diabetic chronic kidney disease: Secondary | ICD-10-CM

## 2015-06-17 DIAGNOSIS — Z1239 Encounter for other screening for malignant neoplasm of breast: Secondary | ICD-10-CM

## 2015-06-17 DIAGNOSIS — I10 Essential (primary) hypertension: Secondary | ICD-10-CM

## 2015-06-17 DIAGNOSIS — Z23 Encounter for immunization: Secondary | ICD-10-CM

## 2015-06-17 DIAGNOSIS — E1165 Type 2 diabetes mellitus with hyperglycemia: Secondary | ICD-10-CM

## 2015-06-17 DIAGNOSIS — N183 Chronic kidney disease, stage 3 unspecified: Secondary | ICD-10-CM

## 2015-06-17 DIAGNOSIS — IMO0002 Reserved for concepts with insufficient information to code with codable children: Secondary | ICD-10-CM

## 2015-06-17 DIAGNOSIS — E785 Hyperlipidemia, unspecified: Secondary | ICD-10-CM

## 2015-06-17 LAB — COMPREHENSIVE METABOLIC PANEL
ALK PHOS: 68 U/L (ref 39–117)
ALT: 12 U/L (ref 0–35)
AST: 17 U/L (ref 0–37)
Albumin: 4.4 g/dL (ref 3.5–5.2)
BUN: 30 mg/dL — ABNORMAL HIGH (ref 6–23)
CALCIUM: 10.1 mg/dL (ref 8.4–10.5)
CO2: 22 meq/L (ref 19–32)
Chloride: 104 mEq/L (ref 96–112)
Creatinine, Ser: 1.09 mg/dL (ref 0.40–1.20)
GFR: 52.09 mL/min — AB (ref >60.00)
GLUCOSE: 241 mg/dL — AB (ref 70–99)
POTASSIUM: 4.1 meq/L (ref 3.5–5.1)
Sodium: 137 mEq/L (ref 135–145)
Total Bilirubin: 1 mg/dL (ref 0.2–1.2)
Total Protein: 7.7 g/dL (ref 6.0–8.3)

## 2015-06-17 MED ORDER — DAPAGLIFLOZIN PROPANEDIOL 10 MG PO TABS: 10.0000 mg | ORAL_TABLET | Freq: Every day | ORAL | Status: DC

## 2015-06-17 MED ORDER — POLYETHYLENE GLYCOL 3350 17 GM/SCOOP PO POWD: 17.0000 g | Freq: Two times a day (BID) | ORAL | Status: DC | PRN

## 2015-06-17 MED ORDER — ATORVASTATIN CALCIUM 40 MG PO TABS: 40.0000 mg | ORAL_TABLET | Freq: Every day | ORAL | Status: DC

## 2015-06-17 NOTE — Progress Notes (Signed)
Pre visit review using our clinic review tool, if applicable. No additional management support is needed unless otherwise documented below in the visit note. 

## 2015-06-17 NOTE — Patient Instructions (Addendum)
It was nice to see you today.  I have stopped the pravastatin for your cholesterol. Start the lipitor.  Continue the glyburide and the farxiga. I have refilled the farxiga.  Take Miralax once or twice daily for constipation.  We will call with your appts for the eye check and the bone density test.  Follow up with me in 3 months.  Take care  Dr. Lacinda Axon

## 2015-06-19 ENCOUNTER — Encounter: Payer: Self-pay | Admitting: Family Medicine

## 2015-06-19 DIAGNOSIS — Z0000 Encounter for general adult medical examination without abnormal findings: Secondary | ICD-10-CM | POA: Insufficient documentation

## 2015-06-19 DIAGNOSIS — I10 Essential (primary) hypertension: Secondary | ICD-10-CM | POA: Insufficient documentation

## 2015-06-19 NOTE — Assessment & Plan Note (Signed)
Not well controlled. Switching to Lipitor.

## 2015-06-19 NOTE — Assessment & Plan Note (Addendum)
Well controlled. Continue Norvasc & Lisinopril.

## 2015-06-19 NOTE — Progress Notes (Signed)
Subjective:  Patient ID: Felicia Roberts, female    DOB: Nov 21, 1940  Age: 75 y.o. MRN: [id number]  CC: Establish care - HTN, HLD, DM, Constipation  HPI Felicia Roberts is a 75 y.o. female presents to the clinic today to establish care. Current issues are below.  HTN  Well controlled on Norvasc and Lisinopril  HLD  Recent lipid panel in January revealed elevated LDL of 110.  She is compliant with Pravastatin.  Will discuss changing therapy today.  DM-2  Currently taking glyburide and Farxiga.  Unclear why she is not on metformin.  Wilder Glade was recently added, ~ 1 month ago.  Current fasting sugars are less than 100 indicating good control. Last A1C was 8.8 on 3/13.  In need of eye exam.  Constipation  Patient reports that she has to strain significantly to produce a bowel movement.  She reports some associated rectal pain.  PMH, Surgical Hx, Family Hx, Social History reviewed and updated as below.  Past Medical History  Diagnosis Date  . Hypertension   . Diabetes mellitus without complication ([place])   . Asthma   . Hyperlipidemia   . Diverticulosis   . Chicken pox   . Colon polyps    Past Surgical History  Procedure Laterality Date  . Bladder surgery     Family History  Problem Relation Age of Onset  . Cancer Father     prostate  . Diabetes Sister   . Hypertension Sister   . AAA (abdominal aortic aneurysm) Brother   . Diabetes Sister   . Hypertension Sister   . Diabetes Sister   . Hypertension Sister   . Diabetes Sister   . Hypertension Sister   . Hypertension Mother   . Heart disease Mother    Social History  Substance Use Topics  . Smoking status: Never Smoker   . Smokeless tobacco: Never Used  . Alcohol Use: No   Review of Systems  Gastrointestinal: Positive for constipation and rectal pain.  All other systems reviewed and are negative.   Objective:   Today's Vitals: BP 126/82 mmHg  Pulse 70  Temp(Src) 98.1 F (36.7 C) (Oral)   Ht 5' (1.524 m)  Wt 123 lb 8 oz (56.019 kg)  BMI 24.12 kg/m2  SpO2 98%  Physical Exam  Constitutional: She is oriented to person, place, and time. She appears well-developed. No distress.  HENT:  Mouth/Throat: Oropharynx is clear and moist.  Normal TM's bilaterally.  Eyes: Conjunctivae are normal. No scleral icterus.  Neck: Neck supple.  Cardiovascular: Normal rate and regular rhythm.   Pulmonary/Chest: Effort normal and breath sounds normal.  Abdominal: Soft. She exhibits no distension. There is no tenderness. There is no rebound and no guarding.  Genitourinary:  Rectal exam - Skin tags from hemorrhoids noted.   Musculoskeletal: Normal range of motion.  Neurological: She is alert and oriented to person, place, and time.  Skin: Skin is warm and dry. No rash noted.  Psychiatric: She has a normal mood and affect.  Vitals reviewed.  Assessment & Plan:   Problem List Items Addressed This Visit    Hyperlipidemia    Not well controlled. Switching to Lipitor.      Relevant Medications   atorvastatin (LIPITOR) 40 MG tablet   DM (diabetes mellitus), type 2, uncontrolled, with renal complications ([place])    Continuing Farxiga and Glyburide. Patient be fairly controlled per patient's fasting sugars after recent addition of Farxiga. Referral placed for eye exam.      Relevant  Medications   atorvastatin (LIPITOR) 40 MG tablet   dapagliflozin propanediol (FARXIGA) 10 MG TABS tablet   Other Relevant Orders   Comprehensive metabolic panel (Completed)   Essential hypertension - Primary    Well controlled. Continue Norvasc & Lisinopril.      Relevant Medications   atorvastatin (LIPITOR) 40 MG tablet   CKD (chronic kidney disease) stage 3, GFR 30-59 ml/min   Constipation   Relevant Medications   polyethylene glycol powder (GLYCOLAX/MIRALAX) powder    Other Visit Diagnoses    Breast cancer screening        Relevant Orders    MM DIGITAL SCREENING BILATERAL    Need for shingles  vaccine        Relevant Orders    Varicella-zoster vaccine subcutaneous (Completed)    Estrogen deficiency        Relevant Orders    DG Bone Density    Ambulatory referral to Ophthalmology       Outpatient Encounter Prescriptions as of 06/17/2015  Medication Sig  . albuterol (PROAIR HFA) 108 (90 Base) MCG/ACT inhaler Inhale 1-2 puffs into the lungs every 6 (six) hours as needed for wheezing or shortness of breath.  Marland Kitchen amLODipine (NORVASC) 5 MG tablet Take 1 tablet (5 mg total) by mouth daily.  . Fluticasone-Salmeterol (ADVAIR) 500-50 MCG/DOSE AEPB Inhale 1 puff into the lungs 2 (two) times daily.  Marland Kitchen glyBURIDE (DIABETA) 5 MG tablet Take 1 tablet (5 mg total) by mouth 2 (two) times daily.  Marland Kitchen lisinopril (PRINIVIL,ZESTRIL) 20 MG tablet Take 1 tablet (20 mg total) by mouth daily.  . [DISCONTINUED] pravastatin (PRAVACHOL) 40 MG tablet Take 40 mg by mouth daily. Reported on 06/06/2015  . [DISCONTINUED] predniSONE (DELTASONE) 10 MG tablet Take 1 tablet (10 mg total) by mouth daily with breakfast.  . atorvastatin (LIPITOR) 40 MG tablet Take 1 tablet (40 mg total) by mouth daily.  . dapagliflozin propanediol (FARXIGA) 10 MG TABS tablet Take 10 mg by mouth daily.  . polyethylene glycol powder (GLYCOLAX/MIRALAX) powder Take 17 g by mouth 2 (two) times daily as needed.  . [DISCONTINUED] dapagliflozin propanediol (FARXIGA) 10 MG TABS tablet Take 10 mg by mouth daily. (Patient not taking: Reported on 06/17/2015)   No facility-administered encounter medications on file as of 06/17/2015.    Follow-up: 3 months.  [place]

## 2015-06-20 ENCOUNTER — Telehealth: Payer: Self-pay | Admitting: Family Medicine

## 2015-06-20 DIAGNOSIS — N183 Chronic kidney disease, stage 3 unspecified: Secondary | ICD-10-CM | POA: Insufficient documentation

## 2015-06-20 DIAGNOSIS — K5900 Constipation, unspecified: Secondary | ICD-10-CM | POA: Insufficient documentation

## 2015-06-20 NOTE — Assessment & Plan Note (Signed)
Continuing Farxiga and Glyburide. Patient be fairly controlled per patient's fasting sugars after recent addition of Farxiga. Referral placed for eye exam.

## 2015-06-20 NOTE — Telephone Encounter (Signed)
Pt dropped off information that Dr. Lacinda Axon wanted on Pt's last doctor. Paper is in Dr. Jonathon Jordan box.

## 2015-06-21 NOTE — Telephone Encounter (Signed)
It was received

## 2015-06-22 ENCOUNTER — Encounter: Payer: Self-pay | Admitting: Family Medicine

## 2015-07-05 ENCOUNTER — Other Ambulatory Visit: Payer: Self-pay | Admitting: Family Medicine

## 2015-07-05 ENCOUNTER — Ambulatory Visit
Admission: RE | Admit: 2015-07-05 | Discharge: 2015-07-05 | Disposition: A | Discharge status: Home / Self Care | Payer: Medicare HMO | Source: Ambulatory Visit | Attending: Family Medicine | Admitting: Family Medicine

## 2015-07-05 DIAGNOSIS — Z1239 Encounter for other screening for malignant neoplasm of breast: Secondary | ICD-10-CM

## 2015-07-05 DIAGNOSIS — Z1231 Encounter for screening mammogram for malignant neoplasm of breast: Secondary | ICD-10-CM | POA: Diagnosis not present

## 2015-07-18 ENCOUNTER — Encounter: Payer: Medicare HMO | Admitting: Physician Assistant

## 2015-07-21 DIAGNOSIS — H169 Unspecified keratitis: Secondary | ICD-10-CM | POA: Diagnosis not present

## 2015-07-21 LAB — HM DIABETES EYE EXAM

## 2015-08-01 ENCOUNTER — Ambulatory Visit: Payer: Medicare HMO

## 2015-08-15 ENCOUNTER — Emergency Department
Admission: EM | Admit: 2015-08-15 | Discharge: 2015-08-15 | Disposition: A | Discharge status: Home / Self Care | Payer: Medicare HMO | Attending: Emergency Medicine | Admitting: Emergency Medicine

## 2015-08-15 ENCOUNTER — Ambulatory Visit: Payer: Medicare HMO | Admitting: Family Medicine

## 2015-08-15 ENCOUNTER — Emergency Department: Payer: Medicare HMO

## 2015-08-15 ENCOUNTER — Telehealth: Payer: Self-pay | Admitting: *Deleted

## 2015-08-15 ENCOUNTER — Encounter: Payer: Self-pay | Admitting: Intensive Care

## 2015-08-15 ENCOUNTER — Telehealth: Payer: Self-pay | Admitting: Family Medicine

## 2015-08-15 DIAGNOSIS — I129 Hypertensive chronic kidney disease with stage 1 through stage 4 chronic kidney disease, or unspecified chronic kidney disease: Secondary | ICD-10-CM | POA: Diagnosis not present

## 2015-08-15 DIAGNOSIS — Z79899 Other long term (current) drug therapy: Secondary | ICD-10-CM | POA: Diagnosis not present

## 2015-08-15 DIAGNOSIS — N183 Chronic kidney disease, stage 3 (moderate): Secondary | ICD-10-CM | POA: Diagnosis not present

## 2015-08-15 DIAGNOSIS — E1122 Type 2 diabetes mellitus with diabetic chronic kidney disease: Secondary | ICD-10-CM | POA: Diagnosis not present

## 2015-08-15 DIAGNOSIS — R05 Cough: Secondary | ICD-10-CM | POA: Diagnosis not present

## 2015-08-15 DIAGNOSIS — E785 Hyperlipidemia, unspecified: Secondary | ICD-10-CM | POA: Diagnosis not present

## 2015-08-15 DIAGNOSIS — J209 Acute bronchitis, unspecified: Secondary | ICD-10-CM | POA: Diagnosis not present

## 2015-08-15 DIAGNOSIS — Z7951 Long term (current) use of inhaled steroids: Secondary | ICD-10-CM | POA: Diagnosis not present

## 2015-08-15 DIAGNOSIS — Z7984 Long term (current) use of oral hypoglycemic drugs: Secondary | ICD-10-CM | POA: Diagnosis not present

## 2015-08-15 DIAGNOSIS — R0602 Shortness of breath: Secondary | ICD-10-CM | POA: Diagnosis not present

## 2015-08-15 LAB — BASIC METABOLIC PANEL
Anion gap: 12 (ref 5–15)
BUN: 25 mg/dL — AB (ref 6–20)
CALCIUM: 10 mg/dL (ref 8.9–10.3)
CO2: 17 mmol/L — ABNORMAL LOW (ref 22–32)
CREATININE: 1.2 mg/dL — AB (ref 0.44–1.00)
Chloride: 109 mmol/L (ref 101–111)
GFR, EST AFRICAN AMERICAN: 50 mL/min — AB (ref >60)
GFR, EST NON AFRICAN AMERICAN: 43 mL/min — AB (ref >60)
Glucose, Bld: 249 mg/dL — ABNORMAL HIGH (ref 65–99)
Potassium: 4.2 mmol/L (ref 3.5–5.1)
SODIUM: 138 mmol/L (ref 135–145)

## 2015-08-15 LAB — CBC WITH DIFFERENTIAL/PLATELET
BASOS ABS: 0.1 10*3/uL (ref 0–0.1)
BASOS PCT: 1 %
EOS ABS: 0.6 10*3/uL (ref 0–0.7)
EOS PCT: 10 %
HCT: 44.1 % (ref 35.0–47.0)
Hemoglobin: 14.5 g/dL (ref 12.0–16.0)
LYMPHS ABS: 2.6 10*3/uL (ref 1.0–3.6)
Lymphocytes Relative: 39 %
MCH: 30.3 pg (ref 26.0–34.0)
MCHC: 32.8 g/dL (ref 32.0–36.0)
MCV: 92.2 fL (ref 80.0–100.0)
Monocytes Absolute: 0.4 10*3/uL (ref 0.2–0.9)
Monocytes Relative: 7 %
Neutro Abs: 2.9 10*3/uL (ref 1.4–6.5)
Neutrophils Relative %: 43 %
PLATELETS: 275 10*3/uL (ref 150–440)
RBC: 4.78 MIL/uL (ref 3.80–5.20)
RDW: 13.2 % (ref 11.5–14.5)
WBC: 6.6 10*3/uL (ref 3.6–11.0)

## 2015-08-15 MED ORDER — ALBUTEROL SULFATE (2.5 MG/3ML) 0.083% IN NEBU
5.0000 mg | INHALATION_SOLUTION | Freq: Once | RESPIRATORY_TRACT | Status: AC
  Administered 2015-08-15: 5 mg via RESPIRATORY_TRACT
  Filled 2015-08-15: qty 6

## 2015-08-15 MED ORDER — IPRATROPIUM-ALBUTEROL 0.5-2.5 (3) MG/3ML IN SOLN
RESPIRATORY_TRACT | Status: AC
  Filled 2015-08-15: qty 3

## 2015-08-15 MED ORDER — METHYLPREDNISOLONE SODIUM SUCC 125 MG IJ SOLR
125.0000 mg | Freq: Once | INTRAMUSCULAR | Status: AC
  Administered 2015-08-15: 125 mg via INTRAVENOUS
  Filled 2015-08-15: qty 2

## 2015-08-15 MED ORDER — PREDNISONE 20 MG PO TABS: 20.0000 mg | ORAL_TABLET | Freq: Every day | ORAL | Status: DC

## 2015-08-15 MED ORDER — IPRATROPIUM-ALBUTEROL 0.5-2.5 (3) MG/3ML IN SOLN
3.0000 mL | Freq: Once | RESPIRATORY_TRACT | Status: AC
  Administered 2015-08-15: 11:00:00 via RESPIRATORY_TRACT
  Filled 2015-08-15: qty 3

## 2015-08-15 MED ORDER — AZITHROMYCIN 250 MG PO TABS: ORAL_TABLET | ORAL | Status: DC

## 2015-08-15 NOTE — Discharge Instructions (Signed)
Bronquitis aguda (Acute Bronchitis) La bronquitis es una inflamacin de las vas respiratorias que se extienden desde la trquea [facility] pulmones (bronquios). La inflamacin produce la formacin de mucosidad. Esto produce tos, que es el sntoma ms frecuente de la bronquitis.  Cuando la bronquitis es [place], generalmente comienza de [place] sbita y desaparece luego de un par de semanas. El hbito de fumar, las alergias y el asma pueden empeorar la bronquitis. Los episodios repetidos de bronquitis pueden causar ms problemas pulmonares.  CAUSAS La causa ms frecuente de bronquitis aguda es el mismo virus que produce el resfro. El virus puede propagarse de Ardelia Mems persona a la otra (contagioso) a travs de la tos y los estornudos, y al tocar objetos contaminados. [place].  Cristy Hilts.  Tos con mucosidad.  Dolores [employer] cuerpo.  Congestin en el pecho.  Escalofros.  Falta de aire.  Dolor de Investment banker, operational. DIAGNSTICO  La bronquitis aguda en general se diagnostica con un examen fsico. El mdico tambin le har preguntas sobre su historia clnica. En algunos casos se indican otros estudios, como radiografas, para Clinical research associate.  TRATAMIENTO  La bronquitis aguda generalmente desaparece en un par de semanas. Con frecuencia, no es Systems analyst. Los medicamentos se indican para aliviar la fiebre o la tos. Generalmente, no es necesario el uso de antibiticos, pero pueden recetarse en ciertas ocasiones. En algunos casos, se recomienda el uso de un inhalador para mejorar la falta de aire y Aeronautical engineer tos. Un vaporizador de aire fro podr ayudarlo a [employer] bronquiales y Armed forces technical officer su eliminacin.  INSTRUCCIONES PARA EL CUIDADO EN EL HOGAR  Descanse lo suficiente.  Beba lquidos en abundancia para mantener la orina de color claro o amarillo plido (excepto que padezca una enfermedad que requiera la restriccin de lquidos). El aumento  de lquidos puede ayudar a que las secreciones respiratorias (esputo) sean menos espesas y a reducir la congestin del pecho, y Mining engineer deshidratacin.  Tome los medicamentos solamente como se lo haya indicado el mdico.  Si le recetaron antibiticos, asegrese de terminarlos, incluso si comienza a sentirse mejor.  Evite fumar o aspirar el humo de otros fumadores. La exposicin al humo del cigarrillo o a irritantes qumicos har que la bronquitis empeore. Si fuma, considere el uso de goma de Higher education careers adviser o la aplicacin de parches en la piel que contengan nicotina para Public house manager los sntomas de abstinencia. Si deja de fumar, sus pulmones se curarn ms rpido.  Reduzca la probabilidad de otro episodio de bronquitis aguda lavando sus manos con frecuencia, evitando a las personas que tengan sntomas y tratando de no tocarse las manos con la boca, la nariz o los ojos.  Concurra a todas las visitas de control como se lo haya indicado el mdico. SOLICITE ATENCIN MDICA SI: Los sntomas no mejoran despus de una semana de [place].  SOLICITE ATENCIN MDICA DE INMEDIATO SI:  Comienza a tener fiebre o escalofros cada vez ms intensos.  Siente dolor en el pecho.  Le falta el aire de manera preocupante.  La flema tiene [place].  Se deshidrata.  Se desmaya o siente que va a desmayarse de forma repetida.  Tiene vmitos que se repiten.  Tiene un dolor de cabeza intenso. ASEGRESE DE QUE:   Comprende estas instrucciones.  Controlar su afeccin.  Recibir ayuda de inmediato si no mejora o si empeora.   Esta informacin no tiene Marine scientist el consejo del mdico. Asegrese de hacerle al mdico cualquier  pregunta que tenga.   Document Released: 03/12/2005 Document Revised: 04/02/2014 Elsevier Interactive Patient Education [employer].

## 2015-08-15 NOTE — ED Notes (Signed)
Patient transported to X-ray 

## 2015-08-15 NOTE — Telephone Encounter (Signed)
°  Patient Name: [name]  DOB: Nov 22, 1940    Initial Comment Caller states mom c/o shortness of breath, coughing, chest tightness, trouble sleeping, wheezing   Nurse Assessment  Nurse: Julien Girt, RN, Almyra Free Date/Time Eilene Ghazi Time): 08/15/2015 9:18:04 AM  Confirm and document reason for call. If symptomatic, describe symptoms. You must click the next button to save text entered. ---Caller states her mom is having shortness of breath, she is coughing, has chest tightness, and she is wheezing. Conference with patient, she has asthma and is audibly sob. She used her ProAir inhaler 1 hr ago, advised to take 4 puffs now  Has the patient traveled out of the country within the last 30 days? ---Not Applicable  Does the patient have any new or worsening symptoms? ---Yes  Will a triage be completed? ---Yes  Related visit to physician within the last 2 weeks? ---No  Does the PT have any chronic conditions? (i.e. diabetes, asthma, etc.) ---Yes  List chronic conditions. ---Asthma, Diabetes, Htn  Is this a behavioral health or substance abuse call? ---No     Guidelines    Guideline Title Affirmed Question Affirmed Notes  Asthma Attack Chest pain    Final Disposition User   Go to ED Now Julien Girt, RN, [place] ED  [facility] - ED   Disagree/Comply: Comply

## 2015-08-15 NOTE — ED Notes (Signed)
Pt was brought in by family from home for difficulty breathing X1 week. Pt is A&O X4. Pt has HX Asthma. Pt has cough present

## 2015-08-15 NOTE — Telephone Encounter (Signed)
Patient to comply and is currently at the ED.

## 2015-08-15 NOTE — ED Provider Notes (Addendum)
[facility] Emergency Department Provider Note  ____________________________________________  Time seen: 12:45 PM  I have reviewed the triage vital signs and the nursing notes.   HISTORY  Chief Complaint Asthma    HPI Felicia Roberts is a 75 y.o. female who complains of shortness of breath for the past week. Not exertional, no chest pain. Has had a mildly productive cough as well. No fevers or chills. No diaphoresis dizziness or syncope. Shortness of breath is been very gradual onset and progressive. Not always present.  Patient is oral medication controlled diabetes. History of asthma.     Past Medical History  Diagnosis Date  . Hypertension   . Diabetes mellitus without complication ([place])   . Asthma   . Hyperlipidemia   . Diverticulosis   . Chicken pox   . Colon polyps      Patient Active Problem List   Diagnosis Date Noted  . CKD (chronic kidney disease) stage 3, GFR 30-59 ml/min 06/20/2015  . Constipation 06/20/2015  . Preventative health care 06/19/2015  . Essential hypertension 06/19/2015  . Hyperlipidemia 04/18/2015  . DM (diabetes mellitus), type 2, uncontrolled, with renal complications ([place]) [id number]  . Asthma 04/18/2015     Past Surgical History  Procedure Laterality Date  . Bladder surgery       Current Outpatient Rx  Name  Route  Sig  Dispense  Refill  . albuterol (PROAIR HFA) 108 (90 Base) MCG/ACT inhaler   Inhalation   Inhale 1-2 puffs into the lungs every 6 (six) hours as needed for wheezing or shortness of breath.   18 g   6   . amLODipine (NORVASC) 5 MG tablet   Oral   Take 1 tablet (5 mg total) by mouth daily.   90 tablet   3   . atorvastatin (LIPITOR) 40 MG tablet   Oral   Take 1 tablet (40 mg total) by mouth daily.   90 tablet   3   . azithromycin (ZITHROMAX Z-PAK) 250 MG tablet      Take 2 tablets (500 mg) on  Day 1,  followed by 1 tablet (250 mg) once daily on Days 2 through 5.   6 each    0   . dapagliflozin propanediol (FARXIGA) 10 MG TABS tablet   Oral   Take 10 mg by mouth daily.   90 tablet   1   . Fluticasone-Salmeterol (ADVAIR) 500-50 MCG/DOSE AEPB   Inhalation   Inhale 1 puff into the lungs 2 (two) times daily.   60 each   6   . glyBURIDE (DIABETA) 5 MG tablet   Oral   Take 1 tablet (5 mg total) by mouth 2 (two) times daily.   180 tablet   3   . lisinopril (PRINIVIL,ZESTRIL) 20 MG tablet   Oral   Take 1 tablet (20 mg total) by mouth daily.   90 tablet   3   . polyethylene glycol powder (GLYCOLAX/MIRALAX) powder   Oral   Take 17 g by mouth 2 (two) times daily as needed.   3350 g   1   . predniSONE (DELTASONE) 20 MG tablet   Oral   Take 1 tablet (20 mg total) by mouth daily.   4 tablet   0      Allergies Januvia   Family History  Problem Relation Age of Onset  . Cancer Father     prostate  . Diabetes Sister   . Hypertension Sister   . AAA (  abdominal aortic aneurysm) Brother   . Diabetes Sister   . Hypertension Sister   . Diabetes Sister   . Hypertension Sister   . Diabetes Sister   . Hypertension Sister   . Hypertension Mother   . Heart disease Mother   . Breast cancer Neg Hx     Social History Social History  Substance Use Topics  . Smoking status: Never Smoker   . Smokeless tobacco: Never Used  . Alcohol Use: No    Review of Systems  Constitutional:   No fever or chills.  Eyes:   No vision changes.  ENT:   No sore throat. No rhinorrhea.No recent illness, no sick contacts Cardiovascular:   No chest pain. Respiratory:   Shortness of breath with productive cough. Gastrointestinal:   Negative for abdominal pain, vomiting and diarrhea.  Genitourinary:   Negative for dysuria or difficulty urinating. Musculoskeletal:   Negative for focal pain or swelling Neurological:   Negative for headaches 10-point ROS otherwise negative.  ____________________________________________   PHYSICAL EXAM:  VITAL SIGNS: ED Triage  Vitals  Enc Vitals Group     BP 08/15/15 1117 124/73 mmHg     Pulse Rate 08/15/15 1110 69     Resp 08/15/15 1117 22     Temp 08/15/15 1117 98 F (36.7 C)     Temp Source 08/15/15 1117 Oral     SpO2 08/15/15 1110 97 %     Weight 08/15/15 1117 125 lb (56.7 kg)     Height 08/15/15 1117 5\' 1"  (D34-534 m)     Head Cir --      Peak Flow --      Pain Score --      Pain Loc --      Pain Edu? --      Excl. in [place]? --     Vital signs reviewed, nursing assessments reviewed.   Constitutional:   Alert and oriented. Well appearing and in no distress. Eyes:   No scleral icterus. No conjunctival pallor. PERRL. EOMI.  No nystagmus. ENT   Head:   Normocephalic and atraumatic.   Nose:   No congestion/rhinnorhea. No septal hematoma   Mouth/Throat:   MMM, no pharyngeal erythema. No peritonsillar mass.    Neck:   No stridor. No SubQ emphysema. No meningismus. Hematological/Lymphatic/Immunilogical:   No cervical lymphadenopathy. Cardiovascular:   RRR. Symmetric bilateral radial and DP pulses.  No murmurs.  Respiratory:   Normal respiratory effort without tachypnea nor retractions. Diffuse expiratory wheezing, slightly prolonged expiratory phase. No focal crackles or other findings on inspiration. Gastrointestinal:   Soft and nontender. Non distended. There is no CVA tenderness.  No rebound, rigidity, or guarding. Genitourinary:   deferred Musculoskeletal:   Nontender with normal range of motion in all extremities. No joint effusions.  No lower extremity tenderness.  No edema. Neurologic:   Normal speech and language.  CN 2-10 normal. Motor grossly intact. No gross focal neurologic deficits are appreciated.  Skin:    Skin is warm, dry and intact. No rash noted.  No petechiae, purpura, or bullae.  ____________________________________________    LABS (pertinent positives/negatives) (all labs ordered are listed, but only abnormal results are displayed) Labs Reviewed  BASIC METABOLIC  PANEL - Abnormal; Notable for the following:    CO2 17 (*)    Glucose, Bld 249 (*)    BUN 25 (*)    Creatinine, Ser 1.20 (*)    GFR calc non Af Amer 43 (*)    GFR calc  Af Amer 50 (*)    All other components within normal limits  CBC WITH DIFFERENTIAL/PLATELET   ____________________________________________   EKG    ____________________________________________    RADIOLOGY  Chest x-ray unremarkable  ____________________________________________   PROCEDURES   ____________________________________________   INITIAL IMPRESSION / ASSESSMENT AND PLAN / ED COURSE  Pertinent labs & imaging results that were available during my care of the patient were reviewed by me and considered in my medical decision making (see chart for details).  Patient well appearing no acute distress, presents for evaluation of shortness of breath with productive cough. Based on exam vitals and labs, this appears to be acute bronchitis or asthma exacerbation, low suspicion for pneumonia. No evidence of sepsis. She is very well appearing, tolerating oral intake, and wants to go home. She has refills of her albuterol inhaler at home. I'll start the patient on prednisone, give some bronchodilators here in the emergency department. Also the patient on a course of azithromycin with her history of diabetes and the prolonged course lasting about a week so far. I counseled the patient on worsening hyperglycemia and glucose control in the setting of steroid use and to monitor her blood sugars and make sure she is drinking plenty of water. Follow up closely with primary care in 2 days.     ____________________________________________   FINAL CLINICAL IMPRESSION(S) / ED DIAGNOSES  Final diagnoses:  Acute bronchitis, unspecified organism       Portions of this note were generated with dragon dictation software. Dictation errors may occur despite best attempts at proofreading.   Carrie Mew,  MD 08/15/15 1354  Carrie Mew, MD 08/15/15 [phone]

## 2015-08-18 ENCOUNTER — Encounter: Payer: Self-pay | Admitting: Family Medicine

## 2015-08-18 ENCOUNTER — Ambulatory Visit (INDEPENDENT_AMBULATORY_CARE_PROVIDER_SITE_OTHER): Payer: Medicare HMO | Admitting: Family Medicine

## 2015-08-18 VITALS — BP 124/76 | HR 82 | Temp 98.9°F | Ht 60.0 in | Wt 126.4 lb

## 2015-08-18 DIAGNOSIS — J4541 Moderate persistent asthma with (acute) exacerbation: Secondary | ICD-10-CM | POA: Insufficient documentation

## 2015-08-18 DIAGNOSIS — R69 Illness, unspecified: Secondary | ICD-10-CM | POA: Diagnosis not present

## 2015-08-18 DIAGNOSIS — J45901 Unspecified asthma with (acute) exacerbation: Secondary | ICD-10-CM

## 2015-08-18 MED ORDER — ALBUTEROL SULFATE (2.5 MG/3ML) 0.083% IN NEBU: 2.5000 mg | INHALATION_SOLUTION | Freq: Four times a day (QID) | RESPIRATORY_TRACT | Status: DC | PRN

## 2015-08-18 MED ORDER — PREDNISONE 10 MG PO TABS: ORAL_TABLET | ORAL | Status: DC

## 2015-08-18 NOTE — Progress Notes (Signed)
Subjective:  Patient ID: Felicia Roberts, female    DOB: February 14, 1941  Age: 75 y.o. MRN: [id number]  CC: ED follow up  HPI:  75 year old female with HTN, HLD, DM 2, CKD, & Asthma presents for ED follow up.  ED follow up - Asthma exacerbation vs Acute bronchitis  Patient was seen in the ED on 5/22.  She presented with shortness of breath and cough.  She was evaluated and was found to have acute bronchitis versus acute asthma exacerbation.  She was treated with bronchodilators and was discharged home on azithromycin and prednisone.  She presents today for follow-up.  Patient states that she is continuing to have some wheezing and cough but she is markedly improved.  She has one more dose of her prednisone. She is asking for additional prednisone as she is not quite clear.  No recent fever or chills. No other complaints at this time  Social Hx   Social History   Social History  . Marital Status: Single    Spouse Name: N/A  . Number of Children: N/A  . Years of Education: N/A   Social History Main Topics  . Smoking status: Never Smoker   . Smokeless tobacco: Never Used  . Alcohol Use: No  . Drug Use: No  . Sexual Activity: Not Asked   Other Topics Concern  . None   Social History Narrative   Review of Systems  Constitutional: Negative for fever and chills.  Respiratory: Positive for cough and shortness of breath.    Objective:  BP 124/76 mmHg  Pulse 82  Temp(Src) 98.9 F (37.2 C) (Oral)  Ht 5' (1.524 m)  Wt 126 lb 6 oz (57.323 kg)  BMI 24.68 kg/m2  SpO2 97%  BP/Weight 08/18/2015 08/15/2015 123456  Systolic BP [id number] [id number] [id number]  Diastolic BP 76 56 82  Wt. (Lbs) 126.38 125 123.5  BMI 24.68 23.63 24.12   Physical Exam  Constitutional: She is oriented to person, place, and time. She appears well-developed. No distress.  Cardiovascular: Normal rate and regular rhythm.   Pulmonary/Chest: Effort normal.  Mild diffuse expiratory wheezing.  Neurological: She  is alert and oriented to person, place, and time.  Psychiatric: She has a normal mood and affect.  Vitals reviewed.  Lab Results  Component Value Date   WBC 6.6 08/15/2015   HGB 14.5 08/15/2015   HCT 44.1 08/15/2015   PLT 275 08/15/2015   GLUCOSE 249* 08/15/2015   CHOL 215* 04/19/2015   TRIG 154* 04/19/2015   HDL 74 04/19/2015   LDLCALC 110* 04/19/2015   ALT 12 06/17/2015   AST 17 06/17/2015   NA 138 08/15/2015   K 4.2 08/15/2015   CL 109 08/15/2015   CREATININE 1.20* 08/15/2015   BUN 25* 08/15/2015   CO2 17* 08/15/2015   TSH 2.340 04/19/2015   INR 0.9 01/14/2013   HGBA1C 8.8 06/06/2015   Assessment & Plan:   Problem List Items Addressed This Visit    Asthma exacerbation - Primary    Acute exacerbation. Markedly improved. She is not clear however. She still has some cough and wheezing. Extending her prednisone course. Rx given today. Albuterol nebulizer refilled.      Relevant Medications   albuterol (PROVENTIL) (2.5 MG/3ML) 0.083% nebulizer solution   predniSONE (DELTASONE) 10 MG tablet      Meds ordered this encounter  Medications  . albuterol (PROVENTIL) (2.5 MG/3ML) 0.083% nebulizer solution    Sig: Take 3 mLs (2.5 mg total) by nebulization every  6 (six) hours as needed for wheezing or shortness of breath.    Dispense:  150 mL    Refill:  1  . predniSONE (DELTASONE) 10 MG tablet    Sig: 20 mg (2 tablets) x 2 days, then 10 mg (1 tablet) x 2 days.    Dispense:  6 tablet    Refill:  0   Follow-up: As scheduled previously  [place]

## 2015-08-18 NOTE — Assessment & Plan Note (Signed)
Acute exacerbation. Markedly improved. She is not clear however. She still has some cough and wheezing. Extending her prednisone course. Rx given today. Albuterol nebulizer refilled.

## 2015-08-18 NOTE — Progress Notes (Signed)
Pre visit review using our clinic review tool, if applicable. No additional management support is needed unless otherwise documented below in the visit note. 

## 2015-08-18 NOTE — Patient Instructions (Signed)
Take the prednisone from the ER tomorrow.  Then start the prescription from me (2 tablets daily for 2 days, then 1 tablet daily for 2 days)  Use the albuterol as needed.  Call [facility] if this happens again, Ext [phone].  Take care  Dr. Lacinda Axon

## 2015-09-19 ENCOUNTER — Encounter: Payer: Self-pay | Admitting: Family Medicine

## 2015-09-19 ENCOUNTER — Ambulatory Visit (INDEPENDENT_AMBULATORY_CARE_PROVIDER_SITE_OTHER): Payer: Medicare HMO | Admitting: Family Medicine

## 2015-09-19 VITALS — BP 120/72 | HR 95 | Temp 97.9°F | Ht 60.0 in | Wt 124.8 lb

## 2015-09-19 DIAGNOSIS — R1032 Left lower quadrant pain: Secondary | ICD-10-CM | POA: Diagnosis not present

## 2015-09-19 DIAGNOSIS — E1165 Type 2 diabetes mellitus with hyperglycemia: Secondary | ICD-10-CM | POA: Diagnosis not present

## 2015-09-19 DIAGNOSIS — E1122 Type 2 diabetes mellitus with diabetic chronic kidney disease: Secondary | ICD-10-CM

## 2015-09-19 DIAGNOSIS — IMO0002 Reserved for concepts with insufficient information to code with codable children: Secondary | ICD-10-CM

## 2015-09-19 DIAGNOSIS — I10 Essential (primary) hypertension: Secondary | ICD-10-CM

## 2015-09-19 DIAGNOSIS — E785 Hyperlipidemia, unspecified: Secondary | ICD-10-CM | POA: Diagnosis not present

## 2015-09-19 DIAGNOSIS — N183 Chronic kidney disease, stage 3 (moderate): Secondary | ICD-10-CM | POA: Diagnosis not present

## 2015-09-19 LAB — COMPREHENSIVE METABOLIC PANEL
ALK PHOS: 75 U/L (ref 39–117)
ALT: 20 U/L (ref 0–35)
AST: 24 U/L (ref 0–37)
Albumin: 4.7 g/dL (ref 3.5–5.2)
BILIRUBIN TOTAL: 1.2 mg/dL (ref 0.2–1.2)
BUN: 22 mg/dL (ref 6–23)
CO2: 25 mEq/L (ref 19–32)
CREATININE: 1.26 mg/dL — AB (ref 0.40–1.20)
Calcium: 10.6 mg/dL — ABNORMAL HIGH (ref 8.4–10.5)
Chloride: 106 mEq/L (ref 96–112)
GFR: 44.04 mL/min — ABNORMAL LOW (ref >60.00)
GLUCOSE: 171 mg/dL — AB (ref 70–99)
Potassium: 4.8 mEq/L (ref 3.5–5.1)
SODIUM: 139 meq/L (ref 135–145)
TOTAL PROTEIN: 7.8 g/dL (ref 6.0–8.3)

## 2015-09-19 LAB — CBC
HCT: 45.7 % (ref 36.0–46.0)
Hemoglobin: 15.2 g/dL — ABNORMAL HIGH (ref 12.0–15.0)
MCHC: 33.2 g/dL (ref 30.0–36.0)
MCV: 91 fl (ref 78.0–100.0)
Platelets: 373 10*3/uL (ref 150.0–400.0)
RBC: 5.03 Mil/uL (ref 3.87–5.11)
RDW: 13.8 % (ref 11.5–15.5)
WBC: 5.5 10*3/uL (ref 4.0–10.5)

## 2015-09-19 LAB — LIPID PANEL
CHOL/HDL RATIO: 2
Cholesterol: 179 mg/dL (ref 0–200)
HDL: 78.2 mg/dL (ref >39.00)
LDL Cholesterol: 75 mg/dL (ref 0–99)
NONHDL: 100.6
Triglycerides: 129 mg/dL (ref 0.0–149.0)
VLDL: 25.8 mg/dL (ref 0.0–40.0)

## 2015-09-19 LAB — HEMOGLOBIN A1C: Hgb A1c MFr Bld: 9.3 % — ABNORMAL HIGH (ref 4.6–6.5)

## 2015-09-19 MED ORDER — AMOXICILLIN-POT CLAVULANATE 875-125 MG PO TABS: 1.0000 | ORAL_TABLET | Freq: Two times a day (BID) | ORAL | Status: DC

## 2015-09-19 NOTE — Assessment & Plan Note (Signed)
New problem. Suspect diverticulitis. Treating with Augmentin.

## 2015-09-19 NOTE — Addendum Note (Signed)
Addended by: Coral Spikes on: 09/19/2015 10:53 PM   Modules accepted: Orders

## 2015-09-19 NOTE — Patient Instructions (Addendum)
We will call with your lab results.  I am treating you for diverticulitis. If your pain persists we will do a CT scan.  Follow up in 2 weeks.  Take care  Dr. Lacinda Axon  Diverticulitis Diverticulitis is inflammation or infection of small pouches in your colon that form when you have a condition called diverticulosis. The pouches in your colon are called diverticula. Your colon, or large intestine, is where water is absorbed and stool is formed. Complications of diverticulitis can include:  Bleeding.  Severe infection.  Severe pain.  Perforation of your colon.  Obstruction of your colon. CAUSES  Diverticulitis is caused by bacteria. Diverticulitis happens when stool becomes trapped in diverticula. This allows bacteria to grow in the diverticula, which can lead to inflammation and infection. RISK FACTORS People with diverticulosis are at risk for diverticulitis. Eating a diet that does not include enough fiber from fruits and vegetables may make diverticulitis more likely to develop. SYMPTOMS  Symptoms of diverticulitis may include:  Abdominal pain and tenderness. The pain is normally located on the left side of the abdomen, but may occur in other areas.  Fever and chills.  Bloating.  Cramping.  Nausea.  Vomiting.  Constipation.  Diarrhea.  Blood in your stool. DIAGNOSIS  Your health care provider will ask you about your medical history and do a physical exam. You may need to have tests done because many medical conditions can cause the same symptoms as diverticulitis. Tests may include:  Blood tests.  Urine tests.  Imaging tests of the abdomen, including X-rays and CT scans. When your condition is under control, your health care provider may recommend that you have a colonoscopy. A colonoscopy can show how severe your diverticula are and whether something else is causing your symptoms. TREATMENT  Most cases of diverticulitis are mild and can be treated at home.  Treatment may include:  Taking over-the-counter pain medicines.  Following a clear liquid diet.  Taking antibiotic medicines by mouth for 7-10 days. More severe cases may be treated at a hospital. Treatment may include:  Not eating or drinking.  Taking prescription pain medicine.  Receiving antibiotic medicines through an IV tube.  Receiving fluids and nutrition through an IV tube.  Surgery. HOME CARE INSTRUCTIONS   Follow your health care provider's instructions carefully.  Follow a full liquid diet or other diet as directed by your health care provider. After your symptoms improve, your health care provider may tell you to change your diet. He or she may recommend you eat a high-fiber diet. Fruits and vegetables are good sources of fiber. Fiber makes it easier to pass stool.  Take fiber supplements or probiotics as directed by your health care provider.  Only take medicines as directed by your health care provider.  Keep all your follow-up appointments. SEEK MEDICAL CARE IF:   Your pain does not improve.  You have a hard time eating food.  Your bowel movements do not return to normal. SEEK IMMEDIATE MEDICAL CARE IF:   Your pain becomes worse.  Your symptoms do not get better.  Your symptoms suddenly get worse.  You have a fever.  You have repeated vomiting.  You have bloody or black, tarry stools. MAKE SURE YOU:   Understand these instructions.  Will watch your condition.2  Will get help right away if you are not doing well or get worse.   This information is not intended to replace advice given to you by your health care provider. Make sure you  discuss any questions you have with your health care provider.   Document Released: 12/20/2004 Document Revised: 03/17/2013 Document Reviewed: 02/04/2013 Elsevier Interactive Patient Education [employer].

## 2015-09-19 NOTE — Assessment & Plan Note (Addendum)
Well controlled on Norvasc and Lisinopril. Continue.

## 2015-09-19 NOTE — Progress Notes (Signed)
Subjective:  Patient ID: Felicia Roberts, female    DOB: [birth year]  Age: 75 y.o. MRN: [id number]  CC: Follow up chronic issues, Abdominal pain.   HPI:  75 year old female with HTN, HLD, DM-2 w/ complications, CKD presents for follow up. Also has complaints of abdominal pain.  HTN  Well controlled on Norvasc and Lisinopril.  HLD  Unsure of control since starting Lipitor.  Needs lipid panel today.  DM-2  Has been uncontrolled.  Patient reports sugars have been stable and well controlled. 126 this am.   Currently on Farxiga and Glyburide.  Abdominal pain  Patient states that she developed LLQ abdominal pain on 6/11.  Pain lasted for 3 days and then improved.  Since that time she has had intermittent LLQ.  Moderate in severity.  No associated nausea/vomiting.   She reports regular bowel movements.   No fever, chills.   No known exacerbating or relieving factors.  Social Hx   Social History   Social History  . Marital Status: Single    Spouse Name: N/A  . Number of Children: N/A  . Years of Education: N/A   Social History Main Topics  . Smoking status: Never Smoker   . Smokeless tobacco: Never Used  . Alcohol Use: No  . Drug Use: No  . Sexual Activity: Not Asked   Other Topics Concern  . None   Social History Narrative   Review of Systems  Constitutional: Negative.   Gastrointestinal: Positive for abdominal pain.   Objective:  BP 120/72 mmHg  Pulse 95  Temp(Src) 97.9 F (36.6 C) (Oral)  Ht 5' (1.524 m)  Wt 124 lb 12 oz (56.586 kg)  BMI 24.36 kg/m2  SpO2 96%  BP/Weight 09/19/2015 08/18/2015 11/06/4816  Systolic BP 563 149 702  Diastolic BP 72 76 56  Wt. (Lbs) 124.75 126.38 125  BMI 24.36 24.68 23.63   Physical Exam  Constitutional: She is oriented to person, place, and time. She appears well-developed. No distress.  Cardiovascular: Normal rate and regular rhythm.   Pulmonary/Chest: Effort normal. She has no wheezes. She has no  rales.  Abdominal: Soft. She exhibits no distension.  Tender to palpation in the LLQ. No rebound or guarding.   Neurological: She is alert and oriented to person, place, and time.  Psychiatric: She has a normal mood and affect.  Vitals reviewed.   Lab Results  Component Value Date   WBC 5.5 09/19/2015   HGB 15.2* 09/19/2015   HCT 45.7 09/19/2015   PLT 373.0 09/19/2015   GLUCOSE 171* 09/19/2015   CHOL 179 09/19/2015   TRIG 129.0 09/19/2015   HDL 78.20 09/19/2015   LDLCALC 75 09/19/2015   ALT 20 09/19/2015   AST 24 09/19/2015   NA 139 09/19/2015   K 4.8 09/19/2015   CL 106 09/19/2015   CREATININE 1.26* 09/19/2015   BUN 22 09/19/2015   CO2 25 09/19/2015   TSH 2.340 04/19/2015   INR 0.9 01/14/2013   HGBA1C 9.3* 09/19/2015    Assessment & Plan:   Problem List Items Addressed This Visit    Hyperlipidemia    Lipid panel today revealed LDL of 75. Good control. Continue Lipitor.       Relevant Orders   Lipid Profile (Completed)   DM (diabetes mellitus), type 2, uncontrolled, with renal complications ([place])    Uncontrolled. GFR 44. Consistently below 60. Patient currently on Glyburide and [place].  Need to stop Farxiga due to GFR. A1C 9.3. Needs to see Endo (  difficult case in setting of CKD and language/cultural barrier.      Relevant Orders   HgB A1c (Completed)   Essential hypertension - Primary    Well controlled on Norvasc and Lisinopril. Continue.      LLQ abdominal pain    New problem. Suspect diverticulitis. Treating with Augmentin.      Relevant Orders   CBC (Completed)   Comp Met (CMET) (Completed)      Meds ordered this encounter  Medications  . amoxicillin-clavulanate (AUGMENTIN) 875-125 MG tablet    Sig: Take 1 tablet by mouth 2 (two) times daily.    Dispense:  20 tablet    Refill:  0    Follow-up: 2 weeks.  [place]

## 2015-09-19 NOTE — Assessment & Plan Note (Signed)
Uncontrolled. GFR 44. Consistently below 60. Patient currently on Glyburide and [place].  Need to stop Farxiga due to GFR. A1C 9.3. Needs to see Endo (difficult case in setting of CKD and language/cultural barrier.

## 2015-09-19 NOTE — Assessment & Plan Note (Signed)
Lipid panel today revealed LDL of 75. Good control. Continue Lipitor.

## 2015-10-03 ENCOUNTER — Ambulatory Visit (INDEPENDENT_AMBULATORY_CARE_PROVIDER_SITE_OTHER): Payer: Medicare HMO | Admitting: Family Medicine

## 2015-10-03 ENCOUNTER — Encounter: Payer: Self-pay | Admitting: Family Medicine

## 2015-10-03 VITALS — BP 118/62 | HR 91 | Temp 98.0°F | Wt 128.0 lb

## 2015-10-03 DIAGNOSIS — E1165 Type 2 diabetes mellitus with hyperglycemia: Secondary | ICD-10-CM | POA: Diagnosis not present

## 2015-10-03 DIAGNOSIS — I10 Essential (primary) hypertension: Secondary | ICD-10-CM

## 2015-10-03 DIAGNOSIS — E1122 Type 2 diabetes mellitus with diabetic chronic kidney disease: Secondary | ICD-10-CM

## 2015-10-03 DIAGNOSIS — J45901 Unspecified asthma with (acute) exacerbation: Secondary | ICD-10-CM

## 2015-10-03 DIAGNOSIS — N183 Chronic kidney disease, stage 3 (moderate): Secondary | ICD-10-CM

## 2015-10-03 DIAGNOSIS — IMO0002 Reserved for concepts with insufficient information to code with codable children: Secondary | ICD-10-CM

## 2015-10-03 MED ORDER — PREDNISONE 10 MG (21) PO TBPK: ORAL_TABLET | ORAL | Status: DC

## 2015-10-03 NOTE — Progress Notes (Signed)
Pre visit review using our clinic review tool, if applicable. No additional management support is needed unless otherwise documented below in the visit note. 

## 2015-10-03 NOTE — Patient Instructions (Signed)
Take the prednisone as prescribed.  Keep an eye on your sugars. STOP the [place] (due to your kidney function).   Be sure to see the specialist.  Follow up in 3-6 months.  Take care  Dr. Lacinda Axon

## 2015-10-04 NOTE — Assessment & Plan Note (Signed)
Patient with signs and symptoms of asthma exacerbation. Treating with prednisone.  Encouraged compliance with Advair.

## 2015-10-04 NOTE — Assessment & Plan Note (Signed)
Uncontrolled. Stop [place]. Continue glipizide. Encouraged to be sure to Endo.

## 2015-10-04 NOTE — Assessment & Plan Note (Signed)
Well controlled. Continue Norvasc and Lisinopril.

## 2015-10-04 NOTE — Progress Notes (Signed)
Subjective:  Patient ID: Felicia Roberts, female    DOB: 23-Jul-1940  Age: 75 y.o. MRN: [id number]  CC: Follow up, SOB  HPI:  75 year old female with HTN, HLD, DM-2, Asthma, CKD III presents for follow-up.  DM-2  Uncontrolled. Most recent A1c was 9.3.  Patient was told to stop [place]. She is still taking.  Compliant with Glyburide.  I recently placed a referral to endocrinology. Appointment is upcoming.  Blood sugars uncontrolled.  Therapeutic options limited in the setting of CKD. Language/cultural barrier also significantly impact care.  HTN  Well controlled.  Doing well on Norvasc & Lisinopril.  SOB/Asthma  Patient reports recent cough and increasing shortness of breath.  She also reports wheezing.  She is not taking her Advair and I'm unsure why.  No associated fevers or chills.  No reports of chest pain. No other complaints at this time.  Social Hx   Social History   Social History  . Marital Status: Single    Spouse Name: N/A  . Number of Children: N/A  . Years of Education: N/A   Social History Main Topics  . Smoking status: Never Smoker   . Smokeless tobacco: Never Used  . Alcohol Use: No  . Drug Use: No  . Sexual Activity: Not Asked   Other Topics Concern  . None   Social History Narrative   Review of Systems  Constitutional: Negative.   Respiratory: Positive for cough, shortness of breath and wheezing.    Objective:  BP 118/62 mmHg  Pulse 91  Temp(Src) 98 F (36.7 C) (Oral)  Wt 128 lb (58.06 kg)  SpO2 95%  BP/Weight 10/03/2015 09/19/2015 99991111  Systolic BP 123456 123456 [id number]  Diastolic BP 62 72 76  Wt. (Lbs) 128 124.75 126.38  BMI 25 24.36 24.68   Physical Exam  Constitutional: She is oriented to person, place, and time. She appears well-developed. No distress.  Cardiovascular: Normal rate and regular rhythm.   Pulmonary/Chest: Effort normal.  Diffuse wheezing.  Neurological: She is alert and oriented to person, place, and  time.  Psychiatric: She has a normal mood and affect.  Vitals reviewed.  Lab Results  Component Value Date   WBC 5.5 09/19/2015   HGB 15.2* 09/19/2015   HCT 45.7 09/19/2015   PLT 373.0 09/19/2015   GLUCOSE 171* 09/19/2015   CHOL 179 09/19/2015   TRIG 129.0 09/19/2015   HDL 78.20 09/19/2015   LDLCALC 75 09/19/2015   ALT 20 09/19/2015   AST 24 09/19/2015   NA 139 09/19/2015   K 4.8 09/19/2015   CL 106 09/19/2015   CREATININE 1.26* 09/19/2015   BUN 22 09/19/2015   CO2 25 09/19/2015   TSH 2.340 04/19/2015   INR 0.9 01/14/2013   HGBA1C 9.3* 09/19/2015    Assessment & Plan:   Problem List Items Addressed This Visit    DM (diabetes mellitus), type 2, uncontrolled, with renal complications ([place]) - Primary    Uncontrolled. Stop [place]. Continue glipizide. Encouraged to be sure to Endo.      Essential hypertension    Well controlled. Continue Norvasc and Lisinopril.      Asthma exacerbation    Patient with signs and symptoms of asthma exacerbation. Treating with prednisone.  Encouraged compliance with Advair.      Relevant Medications   predniSONE (STERAPRED UNI-PAK 21 TAB) 10 MG (21) TBPK tablet      Meds ordered this encounter  Medications  . predniSONE (STERAPRED UNI-PAK 21 TAB) 10 MG (21)  TBPK tablet    Sig: Per package instructions.    Dispense:  21 tablet    Refill:  0    Follow-up: 3-6 months.  [place]

## 2015-10-14 DIAGNOSIS — E1122 Type 2 diabetes mellitus with diabetic chronic kidney disease: Secondary | ICD-10-CM | POA: Diagnosis not present

## 2015-10-14 DIAGNOSIS — N183 Chronic kidney disease, stage 3 (moderate): Secondary | ICD-10-CM | POA: Diagnosis not present

## 2015-10-14 DIAGNOSIS — E1165 Type 2 diabetes mellitus with hyperglycemia: Secondary | ICD-10-CM | POA: Diagnosis not present

## 2015-10-14 DIAGNOSIS — Z794 Long term (current) use of insulin: Secondary | ICD-10-CM | POA: Diagnosis not present

## 2015-10-14 DIAGNOSIS — R69 Illness, unspecified: Secondary | ICD-10-CM | POA: Diagnosis not present

## 2015-11-04 ENCOUNTER — Encounter: Payer: Medicare HMO | Attending: Internal Medicine | Admitting: *Deleted

## 2015-11-04 ENCOUNTER — Encounter: Payer: Self-pay | Admitting: *Deleted

## 2015-11-04 VITALS — BP 140/80 | Ht 61.0 in | Wt 127.5 lb

## 2015-11-04 DIAGNOSIS — Z713 Dietary counseling and surveillance: Secondary | ICD-10-CM | POA: Insufficient documentation

## 2015-11-04 DIAGNOSIS — Z6824 Body mass index (BMI) 24.0-24.9, adult: Secondary | ICD-10-CM | POA: Diagnosis not present

## 2015-11-04 DIAGNOSIS — E119 Type 2 diabetes mellitus without complications: Secondary | ICD-10-CM | POA: Diagnosis not present

## 2015-11-04 DIAGNOSIS — E1122 Type 2 diabetes mellitus with diabetic chronic kidney disease: Secondary | ICD-10-CM

## 2015-11-04 DIAGNOSIS — N183 Chronic kidney disease, stage 3 (moderate): Secondary | ICD-10-CM

## 2015-11-04 NOTE — Patient Instructions (Addendum)
Check blood sugars 2 x day before breakfast and 2 hrs after supper every day  Exercise: Continue walking for   60  minutes   6  days a week  Eat 3 meals day, 1 snack a day Space meals 4-6 hours apart Complete 3 Day Food Record and bring to next appt  Bring blood sugar records to the next appointment  Return for appointment on:  Tuesday November 22, 2015 at 1:15 pm with Jaclyn Shaggy [facility])

## 2015-11-04 NOTE — Progress Notes (Signed)
Diabetes Self-Management Education  Visit Type: First/Initial  Appt. Start Time: 1315 Appt. End Time: [id number]  11/04/2015  [name], identified by name and date of birth, is a 75 y.o. female with a diagnosis of Diabetes: Type 2.   ASSESSMENT  Blood pressure 140/80, height 5\' 1"  (1.549 m), weight 127 lb 8 oz (57.8 kg). Body mass index is 24.09 kg/m.      Diabetes Self-Management Education - 11/04/15 1448      Visit Information   Visit Type First/Initial     Initial Visit   Diabetes Type Type 2   Are you taking your medications as prescribed? Yes  can't use some inhalers for her asthma due to cost   Date Diagnosed ? 15 years ago     Psychosocial Assessment   Patient Belief/Attitude about Diabetes Motivated to manage diabetes   Self-care barriers None  [place] and Spanish speaking   Self-management support Doctor's office   Patient Concerns Nutrition/Meal planning;Medication;Monitoring;Healthy Lifestyle;Weight Control   Special Needs None   Preferred Learning Style Auditory;Visual   Learning Readiness Change in progress   How often do you need to have someone help you when you read instructions, pamphlets, or other written materials from your doctor or pharmacy? 1 - Never     Pre-Education Assessment   Patient understands the diabetes disease and treatment process. Needs Instruction   Patient understands incorporating nutritional management into lifestyle. Needs Review   Patient undertands incorporating physical activity into lifestyle. Demonstrates understanding / competency   Patient understands using medications safely. Needs Review   Patient understands monitoring blood glucose, interpreting and using results Needs Review   Patient understands prevention, detection, and treatment of acute complications. Needs Review   Patient understands prevention, detection, and treatment of chronic complications. Needs Review   Patient understands how to develop strategies  to address psychosocial issues. Needs Review   Patient understands how to develop strategies to promote health/change behavior. Needs Review     Complications   Last HgB A1C per patient/outside source 9.3 %  09/19/15   How often do you check your blood sugar? 1-2 times/day   Fasting Blood glucose range (mg/dL) 70-129  pt reports recent FBG's 80-90's mg/dL with reading of 110 mg/dL today.    Have you had a dilated eye exam in the past 12 months? Yes   Have you had a dental exam in the past 12 months? No   Are you checking your feet? No     Dietary Intake   Breakfast oatmeal or bread or fruit or sandwich with ham and cheese   Lunch fruit or bread or corn or oatmeal   Dinner chicken or fish with salad and starch   Beverage(s) water, unsweetend coffee     Exercise   Exercise Type Light (walking / raking leaves)   How many days per week to you exercise? 6   How many minutes per day do you exercise? 60   Total minutes per week of exercise 360     Patient Education   Previous Diabetes Education No   Disease state  Explored patient's options for treatment of their diabetes;Factors that contribute to the development of diabetes   Nutrition management  Role of diet in the treatment of diabetes and the relationship between the three main macronutrients and blood glucose level   Physical activity and exercise  Role of exercise on diabetes management, blood pressure control and cardiac health.   Medications Reviewed patients medication for diabetes, action,  purpose, timing of dose and side effects.   Monitoring Purpose and frequency of SMBG.;Identified appropriate SMBG and/or A1C goals.   Chronic complications Relationship between chronic complications and blood glucose control;Assessed and discussed foot care and prevention of foot problems   Psychosocial adjustment Identified and addressed patients feelings and concerns about diabetes     Individualized Goals (developed by patient)   Reducing  Risk Improve blood sugars Decrease medications Prevent diabetes complications Lose weight Lead a healthier lifestyle     Outcomes   Expected Outcomes Demonstrated interest in learning. Expect positive outcomes      Individualized Plan for Diabetes Self-Management Training:   Learning Objective:  Patient will have a greater understanding of diabetes self-management. Patient education plan is to attend individual and/or group sessions per assessed needs and concerns.   Plan:   Patient Instructions  Check blood sugars 2 x day before breakfast and 2 hrs after supper every day Exercise: Continue walking for   60  minutes   6  days a week Eat 3 meals day, 1 snack a day Space meals 4-6 hours apart Complete 3 Day Food Record and bring to next appt Bring blood sugar records to the next appointment Return for appointment on:  Tuesday November 22, 2015 at 1:15 pm with Jaclyn Shaggy [facility])   Expected Outcomes:  Demonstrated interest in learning. Expect positive outcomes  Education material provided:  General Meal Planning Guidelines Simple Meal Plan  If problems or questions, patient to contact team via:  Johny Drilling, [place], [place], CDE [phone]  Future DSME appointment:  Tuesday November 22, 2015 with dietitian

## 2015-11-22 ENCOUNTER — Encounter: Payer: Medicare HMO | Admitting: Dietician

## 2015-11-22 VITALS — BP 126/72 | Ht 61.0 in | Wt 128.4 lb

## 2015-11-22 DIAGNOSIS — Z6824 Body mass index (BMI) 24.0-24.9, adult: Secondary | ICD-10-CM | POA: Diagnosis not present

## 2015-11-22 DIAGNOSIS — N183 Chronic kidney disease, stage 3 (moderate): Principal | ICD-10-CM

## 2015-11-22 DIAGNOSIS — Z713 Dietary counseling and surveillance: Secondary | ICD-10-CM | POA: Diagnosis not present

## 2015-11-22 DIAGNOSIS — E1122 Type 2 diabetes mellitus with diabetic chronic kidney disease: Secondary | ICD-10-CM

## 2015-11-22 DIAGNOSIS — E119 Type 2 diabetes mellitus without complications: Secondary | ICD-10-CM | POA: Diagnosis not present

## 2015-11-22 NOTE — Progress Notes (Signed)
Diabetes Self-Management Education  Visit Type:  Follow-up  Appt. Start Time:1310 Appt. End Time: 1405  11/22/2015  [name], identified by name and date of birth, is a 75 y.o. female with a diagnosis of Diabetes:  .Type 2   Reviewed patient's food record with her which indicates she is eating 3 meals per day and 2 small snacks. She is doing a good job of balancing her meals with protein, starch, fruit and non-starchy vegetables. Her glucose records indicate that her FBG's are within recommended range but she reports that she checks her blood sugar 1 hour after her dinner meals and the result is 200-230.  Reviewed carbohydrate foods and portion recommendations based on a 1300 calorie meal plan. Her present diet is overall low in saturated and trans fats. Discussed ways to lower sodium content with  recommendation of no more than 1500mg  /day.  ASSESSMENT  Blood pressure 126/72, height 5\' 1"  (1.549 m), weight 128 lb 6.4 oz (58.2 kg). Body mass index is 24.26 kg/m.       Diabetes Self-Management Education - [id number]      Complications   Last HgB A1C per patient/outside source 9.3 %   How often do you check your blood sugar? 1-2 times/day   Fasting Blood glucose range (mg/dL) 70-129   Postprandial Blood glucose range (mg/dL) >200   Have you had a dilated eye exam in the past 12 months? Yes   Have you had a dental exam in the past 12 months? No   Are you checking your feet? No     Dietary Intake   Breakfast 3/4 cup milk with 1/4 cup coffee, 1 slice ww toast, 1 slice swiss cheese, 4 oz or. juice or plum   Snack (morning) plum or almonds   Lunch 1 chicken and vegetable egg roll, fruit, small salad   Snack (afternoon) yogurt or plum or almonds (small pkg)   Dinner 6:00pm baked chicken or fish, potato or other starch, green salad   Snack (evening) usually no snack; occasionally almonds     Exercise   Exercise Type Light (walking / raking leaves)   How many days per  week to you exercise? 6   How many minutes per day do you exercise? 60   Total minutes per week of exercise 360     Patient Education   Nutrition management  Role of diet in the treatment of diabetes and the relationship between the three main macronutrients and blood glucose level;Food label reading, portion sizes and measuring food.;Carbohydrate counting;Reviewed blood glucose goals for pre and post meals and how to evaluate the patients' food intake on their blood glucose level.;Meal options for control of blood glucose level and chronic complications.   Acute complications Taught treatment of hypoglycemia - the 15 rule.   Chronic complications Relationship between chronic complications and blood glucose control;Lipid levels, blood glucose control and heart disease     Post-Education Assessment   Patient understands the diabetes disease and treatment process. Demonstrates understanding / competency   Patient understands incorporating nutritional management into lifestyle. Demonstrates understanding / competency   Patient undertands incorporating physical activity into lifestyle. Demonstrates understanding / competency   Patient understands using medications safely. Demonstrates understanding / competency   Patient understands monitoring blood glucose, interpreting and using results Demonstrates understanding / competency   Patient understands prevention, detection, and treatment of acute complications. Demonstrates understanding / competency   Patient understands prevention, detection, and treatment of chronic complications. Demonstrates understanding / competency  Patient understands how to develop strategies to promote health/change behavior. Demonstrates understanding / competency     Outcomes   Program Status Completed      Learning Objective:  Patient will have a greater understanding of diabetes self-management. Patient education plan is to attend individual and/or group sessions  per assessed needs and concerns.   Plan:  Patient to balance meals with 1-3 oz of protein, 1 starch serving, 1 fruit serving and non-starchy vegetables. To drink fruit juice only if treating a low blood sugar. To read labels for saturated, trans fat and sodium. To limit saturated fat to less than 10 gms per day with goal to avoid trans fat. To limit sodium to 1500mg  per day or no more than 500 mg per meal. To wait 2 hours after dinner meal to check blood sugar.  Expected Outcomes:  Demonstrated interest in learning. Expect positive outcomes  Education material provided: Planning a balanced meal handout, Plate planner, Nutrition prescription If problems or questions, patient to contact team via:  [phone] To contact [place]  Future DSME appointment: - PRN

## 2015-11-22 NOTE — Patient Instructions (Signed)
Patient to balance meals with 1-3 oz of protein, 1 starch serving, 1 fruit serving and non-starchy vegetables. To drink fruit juice only if treating a low blood sugar. To read labels for saturated, trans fat and sodium. To limit saturated fat to less than 10 gms per day with goal to avoid trans fat. To limit sodium to 1500mg  per day or no more than 500 mg per meal. To wait 2 hours after dinner meal to check blood sugar.

## 2015-11-25 ENCOUNTER — Telehealth: Payer: Self-pay | Admitting: Family Medicine

## 2015-11-25 ENCOUNTER — Other Ambulatory Visit: Payer: Self-pay | Admitting: Family Medicine

## 2015-11-25 DIAGNOSIS — E2839 Other primary ovarian failure: Secondary | ICD-10-CM

## 2015-11-25 NOTE — Telephone Encounter (Signed)
Please advise 

## 2015-11-25 NOTE — Telephone Encounter (Signed)
Pt sent a Mychart request wanting to get a Dexa scan. Need order please and thank you!

## 2015-11-25 NOTE — Telephone Encounter (Signed)
Order placed

## 2015-12-28 DIAGNOSIS — R69 Illness, unspecified: Secondary | ICD-10-CM | POA: Diagnosis not present

## 2016-01-04 ENCOUNTER — Ambulatory Visit (INDEPENDENT_AMBULATORY_CARE_PROVIDER_SITE_OTHER): Payer: Medicare HMO | Admitting: Family Medicine

## 2016-01-04 ENCOUNTER — Encounter: Payer: Self-pay | Admitting: Family Medicine

## 2016-01-04 VITALS — BP 154/96 | HR 86 | Temp 98.4°F | Wt 126.2 lb

## 2016-01-04 DIAGNOSIS — J4541 Moderate persistent asthma with (acute) exacerbation: Secondary | ICD-10-CM

## 2016-01-04 DIAGNOSIS — IMO0002 Reserved for concepts with insufficient information to code with codable children: Secondary | ICD-10-CM

## 2016-01-04 DIAGNOSIS — E1165 Type 2 diabetes mellitus with hyperglycemia: Secondary | ICD-10-CM | POA: Diagnosis not present

## 2016-01-04 DIAGNOSIS — E1122 Type 2 diabetes mellitus with diabetic chronic kidney disease: Secondary | ICD-10-CM | POA: Diagnosis not present

## 2016-01-04 DIAGNOSIS — N183 Chronic kidney disease, stage 3 (moderate): Secondary | ICD-10-CM

## 2016-01-04 LAB — HEMOGLOBIN A1C: HEMOGLOBIN A1C: 8.7 % — AB (ref 4.6–6.5)

## 2016-01-04 MED ORDER — BECLOMETHASONE DIPROPIONATE 80 MCG/ACT IN AERS: 2.0000 | INHALATION_SPRAY | Freq: Two times a day (BID) | RESPIRATORY_TRACT | 12 refills | Status: DC

## 2016-01-04 MED ORDER — IPRATROPIUM BROMIDE 0.02 % IN SOLN
0.5000 mg | Freq: Once | RESPIRATORY_TRACT | Status: AC
  Administered 2016-01-04: 0.5 mg via RESPIRATORY_TRACT

## 2016-01-04 MED ORDER — ALBUTEROL SULFATE HFA 108 (90 BASE) MCG/ACT IN AERS: 1.0000 | INHALATION_SPRAY | Freq: Four times a day (QID) | RESPIRATORY_TRACT | 6 refills | Status: DC | PRN

## 2016-01-04 MED ORDER — PREDNISONE 50 MG PO TABS: ORAL_TABLET | ORAL | 0 refills | Status: DC

## 2016-01-04 MED ORDER — ALBUTEROL SULFATE (2.5 MG/3ML) 0.083% IN NEBU: 2.5000 mg | INHALATION_SOLUTION | Freq: Four times a day (QID) | RESPIRATORY_TRACT | 1 refills | Status: DC | PRN

## 2016-01-04 NOTE — Patient Instructions (Signed)
Take the prednisone as prescribed.  Use the albuterol as needed.  Take the QVAR daily.  Follow up in 3 months (sooner if needed).  Take care  Dr. Lacinda Axon   ** Call if your blood sugars are persistently over 250.

## 2016-01-04 NOTE — Assessment & Plan Note (Signed)
Patient presenting with acute asthma exacerbation. Patient given DuoNeb in the office with improvement in wheezing and shortness of breath. Treating with prednisone. A1c today. Advised to call if sugars are markedly elevated. Also starting on Qvar. Albuterol refilled.

## 2016-01-04 NOTE — Progress Notes (Signed)
Pre visit review using our clinic review tool, if applicable. No additional management support is needed unless otherwise documented below in the visit note. 

## 2016-01-04 NOTE — Progress Notes (Signed)
Subjective:  Patient ID: Felicia Roberts, female    DOB: [birth year]  Age: 75 y.o. MRN: [id number]  CC: Wheezing, SOB (initially this was scheduled for a follow up).  HPI:  75 year old female with type 2 diabetes, CKD, hypertension, hyperlipidemia, asthma presents with the above complaints.  Patient states that she's not been feeling well for the past month. She's had cough, wheezing, shortness of breath. She states that this began after a recent trip. She been using albuterol with improvement temporarily. No associated fevers or chills. No known exacerbating factors. No reports of chest pain. No other complaints or issues at this time.  Social Hx   Social History   Social History  . Marital status: Single    Spouse name: N/A  . Number of children: N/A  . Years of education: N/A   Social History Main Topics  . Smoking status: Never Smoker  . Smokeless tobacco: Never Used  . Alcohol use No  . Drug use: No  . Sexual activity: Not Asked   Other Topics Concern  . None   Social History Narrative  . None    Review of Systems  Constitutional: Negative.   Respiratory: Positive for cough and shortness of breath.    Objective:  BP (!) 154/96 (BP Location: Right Arm, Patient Position: Sitting, Cuff Size: Normal)   Pulse 86   Temp 98.4 F (36.9 C) (Oral)   Wt 126 lb 4 oz (57.3 kg)   SpO2 98%   BMI 23.85 kg/m   BP/Weight 01/04/2016 11/22/2015 [id number]  Systolic BP 123456 [id number]  Diastolic BP 96 72 80  Wt. (Lbs) 126.25 128.4 127.5  BMI 23.85 24.26 24.09   Physical Exam  Constitutional: She is oriented to person, place, and time. She appears well-developed. No distress.  HENT:  Mouth/Throat: Oropharynx is clear and moist.  Cardiovascular: Normal rate and regular rhythm.   Pulmonary/Chest: Effort normal.  Diffuse echo for wheezing and prolonged respiratory phase.  Neurological: She is alert and oriented to person, place, and time.  Psychiatric: She has a normal mood and  affect.  Vitals reviewed.  Lab Results  Component Value Date   WBC 5.5 09/19/2015   HGB 15.2 (H) 09/19/2015   HCT 45.7 09/19/2015   PLT 373.0 09/19/2015   GLUCOSE 171 (H) 09/19/2015   CHOL 179 09/19/2015   TRIG 129.0 09/19/2015   HDL 78.20 09/19/2015   LDLCALC 75 09/19/2015   ALT 20 09/19/2015   AST 24 09/19/2015   NA 139 09/19/2015   K 4.8 09/19/2015   CL 106 09/19/2015   CREATININE 1.26 (H) 09/19/2015   BUN 22 09/19/2015   CO2 25 09/19/2015   TSH 2.340 04/19/2015   INR 0.9 01/14/2013   HGBA1C 9.3 (H) 09/19/2015    Assessment & Plan:   Problem List Items Addressed This Visit    DM (diabetes mellitus), type 2, uncontrolled, with renal complications ([place])   Relevant Medications   pioglitazone (ACTOS) 15 MG tablet   Other Relevant Orders   Hemoglobin A1c   Moderate persistent asthma with exacerbation - Primary    Patient presenting with acute asthma exacerbation. Patient given DuoNeb in the office with improvement in wheezing and shortness of breath. Treating with prednisone. A1c today. Advised to call if sugars are markedly elevated. Also starting on Qvar. Albuterol refilled.      Relevant Medications   beclomethasone (QVAR) 80 MCG/ACT inhaler   predniSONE (DELTASONE) 50 MG tablet   albuterol (PROVENTIL) (2.5 MG/3ML) 0.083% nebulizer solution  albuterol (PROAIR HFA) 108 (90 Base) MCG/ACT inhaler   ipratropium (ATROVENT) nebulizer solution 0.5 mg (Completed)   ipratropium (ATROVENT) nebulizer solution 0.5 mg (Completed)    Other Visit Diagnoses   None.    Meds ordered this encounter  Medications  . beclomethasone (QVAR) 80 MCG/ACT inhaler    Sig: Inhale 2 puffs into the lungs 2 (two) times daily.    Dispense:  2 Inhaler    Refill:  12  . predniSONE (DELTASONE) 50 MG tablet    Sig: 1 tablet daily x 5 days.    Dispense:  5 tablet    Refill:  0  . albuterol (PROVENTIL) (2.5 MG/3ML) 0.083% nebulizer solution    Sig: Take 3 mLs (2.5 mg total) by  nebulization every 6 (six) hours as needed for wheezing or shortness of breath.    Dispense:  150 mL    Refill:  1  . albuterol (PROAIR HFA) 108 (90 Base) MCG/ACT inhaler    Sig: Inhale 1-2 puffs into the lungs every 6 (six) hours as needed for wheezing or shortness of breath.    Dispense:  18 g    Refill:  6  . pioglitazone (ACTOS) 15 MG tablet    Sig: Take by mouth.  Marland Kitchen ipratropium (ATROVENT) nebulizer solution 0.5 mg  . ipratropium (ATROVENT) nebulizer solution 0.5 mg    Follow-up: Return in about 3 months (around 04/05/2016).  [place]

## 2016-01-09 DIAGNOSIS — E1165 Type 2 diabetes mellitus with hyperglycemia: Secondary | ICD-10-CM | POA: Diagnosis not present

## 2016-01-10 DIAGNOSIS — R69 Illness, unspecified: Secondary | ICD-10-CM | POA: Diagnosis not present

## 2016-01-16 DIAGNOSIS — E1165 Type 2 diabetes mellitus with hyperglycemia: Secondary | ICD-10-CM | POA: Diagnosis not present

## 2016-01-16 DIAGNOSIS — E1122 Type 2 diabetes mellitus with diabetic chronic kidney disease: Secondary | ICD-10-CM | POA: Diagnosis not present

## 2016-01-16 DIAGNOSIS — N183 Chronic kidney disease, stage 3 (moderate): Secondary | ICD-10-CM | POA: Diagnosis not present

## 2016-01-16 DIAGNOSIS — Z794 Long term (current) use of insulin: Secondary | ICD-10-CM | POA: Diagnosis not present

## 2016-01-17 DIAGNOSIS — R69 Illness, unspecified: Secondary | ICD-10-CM | POA: Diagnosis not present

## 2016-01-25 ENCOUNTER — Ambulatory Visit: Payer: Medicare HMO

## 2016-02-21 ENCOUNTER — Ambulatory Visit
Admission: RE | Admit: 2016-02-21 | Discharge: 2016-02-21 | Disposition: A | Discharge status: Home / Self Care | Payer: Medicare HMO | Source: Ambulatory Visit | Attending: Family Medicine | Admitting: Family Medicine

## 2016-02-21 DIAGNOSIS — E2839 Other primary ovarian failure: Secondary | ICD-10-CM | POA: Diagnosis not present

## 2016-02-21 DIAGNOSIS — Z780 Asymptomatic menopausal state: Secondary | ICD-10-CM | POA: Diagnosis not present

## 2016-03-20 DIAGNOSIS — R69 Illness, unspecified: Secondary | ICD-10-CM | POA: Diagnosis not present

## 2016-04-06 ENCOUNTER — Encounter: Payer: Self-pay | Admitting: Family Medicine

## 2016-04-06 ENCOUNTER — Ambulatory Visit (INDEPENDENT_AMBULATORY_CARE_PROVIDER_SITE_OTHER): Payer: Medicare HMO | Admitting: Family Medicine

## 2016-04-06 VITALS — BP 149/90 | HR 92 | Temp 98.0°F | Resp 14 | Wt 129.2 lb

## 2016-04-06 DIAGNOSIS — IMO0002 Reserved for concepts with insufficient information to code with codable children: Secondary | ICD-10-CM

## 2016-04-06 DIAGNOSIS — R1013 Epigastric pain: Secondary | ICD-10-CM

## 2016-04-06 DIAGNOSIS — N183 Chronic kidney disease, stage 3 unspecified: Secondary | ICD-10-CM

## 2016-04-06 DIAGNOSIS — E1165 Type 2 diabetes mellitus with hyperglycemia: Secondary | ICD-10-CM | POA: Diagnosis not present

## 2016-04-06 DIAGNOSIS — E1122 Type 2 diabetes mellitus with diabetic chronic kidney disease: Secondary | ICD-10-CM | POA: Diagnosis not present

## 2016-04-06 DIAGNOSIS — J45909 Unspecified asthma, uncomplicated: Secondary | ICD-10-CM | POA: Diagnosis not present

## 2016-04-06 DIAGNOSIS — I10 Essential (primary) hypertension: Secondary | ICD-10-CM

## 2016-04-06 LAB — COMPREHENSIVE METABOLIC PANEL
ALBUMIN: 4.8 g/dL (ref 3.5–5.2)
ALK PHOS: 92 U/L (ref 39–117)
ALT: 25 U/L (ref 0–35)
AST: 21 U/L (ref 0–37)
BUN: 24 mg/dL — AB (ref 6–23)
CALCIUM: 10.5 mg/dL (ref 8.4–10.5)
CHLORIDE: 102 meq/L (ref 96–112)
CO2: 27 mEq/L (ref 19–32)
CREATININE: 1.09 mg/dL (ref 0.40–1.20)
GFR: 51.98 mL/min — ABNORMAL LOW (ref >60.00)
Glucose, Bld: 170 mg/dL — ABNORMAL HIGH (ref 70–99)
POTASSIUM: 5 meq/L (ref 3.5–5.1)
SODIUM: 136 meq/L (ref 135–145)
Total Bilirubin: 1.4 mg/dL — ABNORMAL HIGH (ref 0.2–1.2)
Total Protein: 7.7 g/dL (ref 6.0–8.3)

## 2016-04-06 LAB — CBC
HEMATOCRIT: 42.6 % (ref 36.0–46.0)
Hemoglobin: 14.2 g/dL (ref 12.0–15.0)
MCHC: 33.3 g/dL (ref 30.0–36.0)
MCV: 91.7 fl (ref 78.0–100.0)
Platelets: 387 10*3/uL (ref 150.0–400.0)
RBC: 4.64 Mil/uL (ref 3.87–5.11)
RDW: 13.4 % (ref 11.5–15.5)
WBC: 8.1 10*3/uL (ref 4.0–10.5)

## 2016-04-06 LAB — LIPASE: Lipase: 15 U/L (ref 11.0–59.0)

## 2016-04-06 LAB — HEMOGLOBIN A1C: HEMOGLOBIN A1C: 9.5 % — AB (ref 4.6–6.5)

## 2016-04-06 MED ORDER — METFORMIN HCL ER 500 MG PO TB24: 1000.0000 mg | ORAL_TABLET | Freq: Every day | ORAL | 3 refills | Status: DC

## 2016-04-06 MED ORDER — FLUTICASONE FUROATE-VILANTEROL 100-25 MCG/INH IN AEPB: 1.0000 | INHALATION_SPRAY | Freq: Every day | RESPIRATORY_TRACT | 0 refills | Status: DC

## 2016-04-06 MED ORDER — PANTOPRAZOLE SODIUM 40 MG PO TBEC: 40.0000 mg | DELAYED_RELEASE_TABLET | Freq: Every day | ORAL | 1 refills | Status: DC

## 2016-04-06 MED ORDER — PIOGLITAZONE HCL 15 MG PO TABS: 15.0000 mg | ORAL_TABLET | Freq: Every day | ORAL | 3 refills | Status: DC

## 2016-04-06 NOTE — Assessment & Plan Note (Signed)
Stable. Continue lisinopril and norvasc.

## 2016-04-06 NOTE — Patient Instructions (Signed)
Take the protonix daily.  Start the breo. Stop the Qvar for now.  Follow up in 3 months   Take care  Dr. Lacinda Axon

## 2016-04-06 NOTE — Progress Notes (Signed)
Subjective:  Patient ID: Felicia Roberts, female    DOB: 1940/04/11  Age: 76 y.o. MRN: [id number]  CC: Follow up, issue with asthma medication, abdominal pain  HPI:  76 year old female with asthma, hypertension, lipidemia, DM 2, CKD presents for follow-up.  Asthma  Currently well controlled.  Patient states that she's been doing extremely well since being placed on Qvar.  However, she recently received notice from her insurance company that they are no longer covering Qvar and are requesting her to try/fail to alternatives. She has been on Advair in the past.   Hypertension  Fair control given age (is at previous goal of 150/90).  Currently on Lisinopril and norvasc.  DM-2  Now seeing Endo.  Currently on Metformin, Glyburide and Actos.  Needs A1C today.  Abdominal pain  Epigastric.  Intermittent for the past 3 months.  Denies nausea vomiting.  No hematochezia or melena.  No reports of reflux.  No medication intervention stride. No known exacerbating or relieving factors. Does not appear to be associated with food.  Social Hx   Social History   Social History  . Marital status: Single    Spouse name: N/A  . Number of children: N/A  . Years of education: N/A   Social History Main Topics  . Smoking status: Never Smoker  . Smokeless tobacco: Never Used  . Alcohol use No  . Drug use: No  . Sexual activity: Not Asked   Other Topics Concern  . None   Social History Narrative  . None    Review of Systems  Constitutional: Negative.   Gastrointestinal: Positive for abdominal pain. Negative for blood in stool, nausea and vomiting.   Objective:  BP (!) 149/90   Pulse 92   Temp 98 F (36.7 C) (Oral)   Resp 14   Wt 129 lb 3.2 oz (58.6 kg)   SpO2 97%   BMI 24.41 kg/m   BP/Weight 04/06/2016 01/04/2016 07/02/8117  Systolic BP 147 829 562  Diastolic BP 90 96 72  Wt. (Lbs) 129.2 126.25 128.4  BMI 24.41 23.85 24.26   Physical Exam    Constitutional: She is oriented to person, place, and time. She appears well-developed. No distress.  Cardiovascular: Normal rate and regular rhythm.   Pulmonary/Chest: Effort normal and breath sounds normal.  Abdominal: Soft. She exhibits no distension.  Tender to palpation in the epigastric region.  Neurological: She is alert and oriented to person, place, and time.  Psychiatric: She has a normal mood and affect.  Vitals reviewed.  Lab Results  Component Value Date   WBC 5.5 09/19/2015   HGB 15.2 (H) 09/19/2015   HCT 45.7 09/19/2015   PLT 373.0 09/19/2015   GLUCOSE 171 (H) 09/19/2015   CHOL 179 09/19/2015   TRIG 129.0 09/19/2015   HDL 78.20 09/19/2015   LDLCALC 75 09/19/2015   ALT 20 09/19/2015   AST 24 09/19/2015   NA 139 09/19/2015   K 4.8 09/19/2015   CL 106 09/19/2015   CREATININE 1.26 (H) 09/19/2015   BUN 22 09/19/2015   CO2 25 09/19/2015   TSH 2.340 04/19/2015   INR 0.9 01/14/2013   HGBA1C 8.7 (H) 01/04/2016    Assessment & Plan:   Problem List Items Addressed This Visit    Essential hypertension    Stable. Continue lisinopril and norvasc.      DM (diabetes mellitus), type 2, uncontrolled, with renal complications (H[facility])    Z3Y today. Unsure of current control. Continue metformin, glyburide and Actos.  Relevant Medications   metFORMIN (GLUCOPHAGE-XR) 500 MG 24 hr tablet   pioglitazone (ACTOS) 15 MG tablet   Other Relevant Orders   Comp Met (CMET)   Hemoglobin A1c   CKD (chronic kidney disease) stage 3, GFR 30-59 ml/min   Relevant Orders   CBC   Asthma - Primary    Stopping QVAR and starting Breo (due to insurance coverage). Sample for [facility] given today.      Relevant Medications   fluticasone furoate-vilanterol (BREO ELLIPTA) 100-25 MCG/INH AEPB   Abdominal pain, epigastric    New problem. DDx - PUD, Gastritis, GERD. Labs today - see orders. Trial of Protonix.      Relevant Orders   Lipase      Meds ordered this encounter   Medications  . metFORMIN (GLUCOPHAGE-XR) 500 MG 24 hr tablet    Sig: Take 2 tablets (1,000 mg total) by mouth daily with supper.    Dispense:  180 tablet    Refill:  3  . pioglitazone (ACTOS) 15 MG tablet    Sig: Take 1 tablet (15 mg total) by mouth daily.    Dispense:  90 tablet    Refill:  3  . pantoprazole (PROTONIX) 40 MG tablet    Sig: Take 1 tablet (40 mg total) by mouth daily.    Dispense:  30 tablet    Refill:  1  . fluticasone furoate-vilanterol (BREO ELLIPTA) 100-25 MCG/INH AEPB    Sig: Inhale 1 puff into the lungs daily.    Dispense:  2 each    Refill:  0    Follow-up: Return in about 3 months (around 07/05/2016).  [place]

## 2016-04-06 NOTE — Assessment & Plan Note (Signed)
Stopping QVAR and starting Breo (due to insurance coverage). Sample for [facility] given today.

## 2016-04-06 NOTE — Assessment & Plan Note (Addendum)
New problem. DDx - PUD, Gastritis, GERD. Labs today - see orders. Trial of Protonix.

## 2016-04-06 NOTE — Assessment & Plan Note (Addendum)
A1C today. Unsure of current control. Continue metformin, glyburide and Actos.

## 2016-04-06 NOTE — Progress Notes (Signed)
Pre visit review using our clinic review tool, if applicable. No additional management support is needed unless otherwise documented below in the visit note. 

## 2016-05-04 ENCOUNTER — Other Ambulatory Visit: Payer: Self-pay | Admitting: Physician Assistant

## 2016-05-04 DIAGNOSIS — R69 Illness, unspecified: Secondary | ICD-10-CM | POA: Diagnosis not present

## 2016-05-04 DIAGNOSIS — I10 Essential (primary) hypertension: Secondary | ICD-10-CM

## 2016-05-05 ENCOUNTER — Other Ambulatory Visit: Payer: Self-pay | Admitting: Physician Assistant

## 2016-05-05 DIAGNOSIS — I10 Essential (primary) hypertension: Secondary | ICD-10-CM

## 2016-05-07 ENCOUNTER — Other Ambulatory Visit: Payer: Self-pay | Admitting: Physician Assistant

## 2016-05-07 DIAGNOSIS — I10 Essential (primary) hypertension: Secondary | ICD-10-CM

## 2016-05-07 DIAGNOSIS — E1165 Type 2 diabetes mellitus with hyperglycemia: Secondary | ICD-10-CM | POA: Diagnosis not present

## 2016-05-08 ENCOUNTER — Other Ambulatory Visit: Payer: Self-pay | Admitting: Physician Assistant

## 2016-05-08 DIAGNOSIS — I10 Essential (primary) hypertension: Secondary | ICD-10-CM

## 2016-05-10 ENCOUNTER — Telehealth: Payer: Self-pay | Admitting: Family Medicine

## 2016-05-10 DIAGNOSIS — E119 Type 2 diabetes mellitus without complications: Secondary | ICD-10-CM

## 2016-05-10 DIAGNOSIS — I10 Essential (primary) hypertension: Secondary | ICD-10-CM

## 2016-05-10 MED ORDER — AMLODIPINE BESYLATE 5 MG PO TABS: 5.0000 mg | ORAL_TABLET | Freq: Every day | ORAL | 3 refills | Status: DC

## 2016-05-10 MED ORDER — LISINOPRIL 20 MG PO TABS: 20.0000 mg | ORAL_TABLET | Freq: Every day | ORAL | 3 refills | Status: DC

## 2016-05-10 NOTE — Telephone Encounter (Signed)
Pt called and told that rx was at pharmacy.

## 2016-05-10 NOTE — Telephone Encounter (Signed)
Pt daughter called and is requesting a refill on amLODipine (NORVASC) 5 MG tablet and lisinopril (PRINIVIL,ZESTRIL) 20 MG tablet. Pharmacy sent to wrong doctor. Pt is completely out. Please advise, thank you!  [place], [place]  Call pt @ [phone]

## 2016-05-14 DIAGNOSIS — E1165 Type 2 diabetes mellitus with hyperglycemia: Secondary | ICD-10-CM | POA: Diagnosis not present

## 2016-05-14 DIAGNOSIS — N183 Chronic kidney disease, stage 3 (moderate): Secondary | ICD-10-CM | POA: Diagnosis not present

## 2016-05-14 DIAGNOSIS — E1122 Type 2 diabetes mellitus with diabetic chronic kidney disease: Secondary | ICD-10-CM | POA: Diagnosis not present

## 2016-05-14 DIAGNOSIS — E039 Hypothyroidism, unspecified: Secondary | ICD-10-CM | POA: Diagnosis not present

## 2016-06-08 DIAGNOSIS — H209 Unspecified iridocyclitis: Secondary | ICD-10-CM | POA: Diagnosis not present

## 2016-06-20 DIAGNOSIS — H43812 Vitreous degeneration, left eye: Secondary | ICD-10-CM | POA: Diagnosis not present

## 2016-07-09 ENCOUNTER — Encounter: Payer: Self-pay | Admitting: Family Medicine

## 2016-07-09 ENCOUNTER — Ambulatory Visit (INDEPENDENT_AMBULATORY_CARE_PROVIDER_SITE_OTHER): Payer: Medicare HMO | Admitting: Family Medicine

## 2016-07-09 DIAGNOSIS — I10 Essential (primary) hypertension: Secondary | ICD-10-CM

## 2016-07-09 DIAGNOSIS — E785 Hyperlipidemia, unspecified: Secondary | ICD-10-CM | POA: Diagnosis not present

## 2016-07-09 DIAGNOSIS — IMO0002 Reserved for concepts with insufficient information to code with codable children: Secondary | ICD-10-CM

## 2016-07-09 DIAGNOSIS — E1122 Type 2 diabetes mellitus with diabetic chronic kidney disease: Secondary | ICD-10-CM | POA: Diagnosis not present

## 2016-07-09 DIAGNOSIS — E1165 Type 2 diabetes mellitus with hyperglycemia: Secondary | ICD-10-CM

## 2016-07-09 DIAGNOSIS — J45909 Unspecified asthma, uncomplicated: Secondary | ICD-10-CM

## 2016-07-09 DIAGNOSIS — N183 Chronic kidney disease, stage 3 (moderate): Secondary | ICD-10-CM

## 2016-07-09 MED ORDER — BECLOMETHASONE DIPROPIONATE 80 MCG/ACT IN AERS: 1.0000 | INHALATION_SPRAY | Freq: Two times a day (BID) | RESPIRATORY_TRACT | 12 refills | Status: DC

## 2016-07-09 NOTE — Assessment & Plan Note (Signed)
Advised compliance with medications.

## 2016-07-09 NOTE — Assessment & Plan Note (Signed)
Restarting QVAR.

## 2016-07-09 NOTE — Assessment & Plan Note (Signed)
Uncontrolled. Continue following with Endo. Will likely need insulin. Continue Metformin, Glyburide and Actos.

## 2016-07-09 NOTE — Assessment & Plan Note (Signed)
Stable. Continue Lipitor. 

## 2016-07-09 NOTE — Progress Notes (Signed)
Pre visit review using our clinic review tool, if applicable. No additional management support is needed unless otherwise documented below in the visit note. 

## 2016-07-09 NOTE — Progress Notes (Signed)
Subjective:  Patient ID: Felicia Roberts, female    DOB: [birth year]  Age: 76 y.o. MRN: [id number]  CC: Follow up  HPI:  76 year old female with HTN, HLD, DM-2, Asthma presents for follow up.  HTN  BP elevated today.  Patient states she has not taken her medication for the past 2 days.  Patient is on lisinopril and Norvasc.  Hyperlipidemia  Stable.  Currently on Lipitor and tolerating.  DM-2  Uncontrolled.  Seeing Endo.  Most recent A1c was 9.6 as of 05/07/16.  Patient states that her sugars are improving. She is currently on metformin, glyburide, and Actos. At her last endocrinology visit she refused insulin.  Asthma  Stable currently. Requesting refill on Qvar as she did not see an improvement on Breo.  Social Hx   Social History   Social History  . Marital status: Single    Spouse name: N/A  . Number of children: N/A  . Years of education: N/A   Social History Main Topics  . Smoking status: Never Smoker  . Smokeless tobacco: Never Used  . Alcohol use No  . Drug use: No  . Sexual activity: Not Asked   Other Topics Concern  . None   Social History Narrative  . None    Review of Systems  Constitutional: Negative.   Respiratory: Negative.    Objective:  BP (!) 150/90   Pulse 83   Temp 97.5 F (36.4 C) (Oral)   Wt 128 lb 6 oz (58.2 kg)   SpO2 98%   BMI 24.26 kg/m   BP/Weight 07/09/2016 04/06/2016 63/78/5885  Systolic BP 027 741 287  Diastolic BP 90 90 96  Wt. (Lbs) 128.38 129.2 126.25  BMI 24.26 24.41 23.85   Physical Exam  Constitutional: She is oriented to person, place, and time. She appears well-developed. No distress.  Cardiovascular: Normal rate and regular rhythm.   Pulmonary/Chest: Effort normal and breath sounds normal.  Neurological: She is alert and oriented to person, place, and time.  Psychiatric: She has a normal mood and affect.  Vitals reviewed.  Lab Results  Component Value Date   WBC 8.1 04/06/2016   HGB 14.2  04/06/2016   HCT 42.6 04/06/2016   PLT 387.0 04/06/2016   GLUCOSE 170 (H) 04/06/2016   CHOL 179 09/19/2015   TRIG 129.0 09/19/2015   HDL 78.20 09/19/2015   LDLCALC 75 09/19/2015   ALT 25 04/06/2016   AST 21 04/06/2016   NA 136 04/06/2016   K 5.0 04/06/2016   CL 102 04/06/2016   CREATININE 1.09 04/06/2016   BUN 24 (H) 04/06/2016   CO2 27 04/06/2016   TSH 2.340 04/19/2015   INR 0.9 01/14/2013   HGBA1C 9.5 (H) 04/06/2016    Assessment & Plan:   Problem List Items Addressed This Visit    Hyperlipidemia    Stable. Continue Lipitor.      Essential hypertension    Advised compliance with medications.      DM (diabetes mellitus), type 2, uncontrolled, with renal complications (HCC)    Uncontrolled. Continue following with Endo. Will likely need insulin. Continue Metformin, Glyburide and Actos.      Asthma    Restarting QVAR.      Relevant Medications   beclomethasone (QVAR) 80 MCG/ACT inhaler     Meds ordered this encounter  Medications  . beclomethasone (QVAR) 80 MCG/ACT inhaler    Sig: Inhale 1 puff into the lungs 2 (two) times daily.    Dispense:  1  Inhaler    Refill:  12   Follow-up: Return in about 6 months (around 01/08/2017).  [place]

## 2016-07-09 NOTE — Patient Instructions (Signed)
Be sure to take your medications.  Follow up in 6 months.  Take care  Dr. Lacinda Axon

## 2016-07-10 ENCOUNTER — Telehealth: Payer: Self-pay

## 2016-07-10 NOTE — Telephone Encounter (Signed)
PA for Qvar completed on cover my meds. thanks

## 2016-07-10 NOTE — Telephone Encounter (Signed)
PA approved 03/24/16-03/25/17

## 2016-07-16 ENCOUNTER — Other Ambulatory Visit: Payer: Self-pay | Admitting: Family Medicine

## 2016-07-16 DIAGNOSIS — IMO0002 Reserved for concepts with insufficient information to code with codable children: Secondary | ICD-10-CM

## 2016-07-16 DIAGNOSIS — E1165 Type 2 diabetes mellitus with hyperglycemia: Principal | ICD-10-CM

## 2016-07-16 DIAGNOSIS — N183 Chronic kidney disease, stage 3 (moderate): Principal | ICD-10-CM

## 2016-07-16 DIAGNOSIS — E1122 Type 2 diabetes mellitus with diabetic chronic kidney disease: Secondary | ICD-10-CM

## 2016-08-06 ENCOUNTER — Encounter: Payer: Self-pay | Admitting: Family Medicine

## 2016-08-06 DIAGNOSIS — E119 Type 2 diabetes mellitus without complications: Secondary | ICD-10-CM | POA: Diagnosis not present

## 2016-08-06 DIAGNOSIS — E039 Hypothyroidism, unspecified: Secondary | ICD-10-CM | POA: Diagnosis not present

## 2016-08-06 DIAGNOSIS — E1165 Type 2 diabetes mellitus with hyperglycemia: Secondary | ICD-10-CM | POA: Diagnosis not present

## 2016-08-06 LAB — HM DIABETES EYE EXAM

## 2016-08-13 DIAGNOSIS — E1122 Type 2 diabetes mellitus with diabetic chronic kidney disease: Secondary | ICD-10-CM | POA: Diagnosis not present

## 2016-08-13 DIAGNOSIS — N183 Chronic kidney disease, stage 3 (moderate): Secondary | ICD-10-CM | POA: Diagnosis not present

## 2016-08-13 DIAGNOSIS — E1165 Type 2 diabetes mellitus with hyperglycemia: Secondary | ICD-10-CM | POA: Diagnosis not present

## 2016-09-24 ENCOUNTER — Encounter: Payer: Self-pay | Admitting: Dietician

## 2016-09-24 ENCOUNTER — Encounter: Payer: Medicare HMO | Attending: Internal Medicine | Admitting: Dietician

## 2016-09-24 VITALS — Ht 60.0 in | Wt 129.2 lb

## 2016-09-24 DIAGNOSIS — E119 Type 2 diabetes mellitus without complications: Secondary | ICD-10-CM | POA: Insufficient documentation

## 2016-09-24 DIAGNOSIS — N183 Chronic kidney disease, stage 3 (moderate): Secondary | ICD-10-CM

## 2016-09-24 DIAGNOSIS — Z713 Dietary counseling and surveillance: Secondary | ICD-10-CM | POA: Diagnosis not present

## 2016-09-24 DIAGNOSIS — E1122 Type 2 diabetes mellitus with diabetic chronic kidney disease: Secondary | ICD-10-CM

## 2016-09-24 NOTE — Patient Instructions (Signed)
   Eat something every 3-5 hours during the day. Example: 9am breakfast, 1pm lunch, 6-7pm dinner.   Eat a snack only if you are hungry, or if a meal will be later than the usual time. OK to have a small snack at bedtime if you are hungry.  Make sure your meals are balanced-- have a protein food, a small portion of starch and/or fruit, and a vegetable .  Good job making healthy food choices and including exercise, keep up these good habits!

## 2016-09-24 NOTE — Progress Notes (Signed)
Medical Nutrition Therapy: Visit start time: 1030  end time: 1130  Assessment:  Diagnosis: diabetes Other medical history: HTN, HLD, CKD Stage 3 Psychosocial issues/ stress concerns: none Preferred learning method:  . Visual  Current weight: 129.2lbs  Height: 5'0" Medications, supplements: reconciled list in medical record  Progress and evaluation: Patient reports diabetes diagnosis many years ago, only recently has increased to 8.8%, when she has kept it at 7% since diagnosis. She is uncertain why her SPQ3R has increased, feels she is eating mostly healthy foods and exercises by walking regularly. She tests fasting BGs at home, and reports results from 70s-120s. She does state she has been using the same meter for many years.   Physical activity: walking 30-60 minutes, 4 times a week  Dietary Intake:  Usual eating pattern includes 2-3 meals and 0-2 snacks per day. Dining out frequency: 1 meals per week.  Breakfast: sometimes fruit only: apple, plum, pear, 1 boiled egg sometimes Snack: none Lunch: sometimes skips; maybe 5-6 peanuts or a few pretzels, or yogurt Snack: sometimes pretzels or peanuts, sweet potato chips Supper: sometimes after 7pm chicken, fish, vegetables/ salads Snack: usually none, occasionally peanuts Beverages: water, coffee in am only with milk and splenda 1pkt.   Nutrition Care Education: Topics covered: diabetes Basic nutrition: basic food groups, appropriate nutrient balance, appropriate meal and snack schedule, general nutrition guidelines    Weight control: identifying healthy weight Diabetes: appropriate meal and snack schedule, appropriate carb intake and balance,basic meal planning using plate method and food models; appropriate food portions, effects of carb foods and protein on BGs.  Other lifestyle changes:  Reviewed use of One Touch Verio meter and patient demonstrated procedure.   Nutritional Diagnosis:  [place]-2.2 Altered nutrition-related laboratory As  related to diabetes.  As evidenced by HbA1C 8.8%.  Intervention: Instruction as noted above.   Patient voiced better understanding of meal planning.    Set goals with input from patient.    She will check on insurance coverage for One Touch supplies and will obtain prescription for supplies with new meter.  Education Materials given:  . General diet guidelines for Diabetes . Plate Planner with food lists . Sample meal pattern/ menus . Goals/ instructions  Learner/ who was taught:  . Patient   Level of understanding: Marland Kitchen Verbalizes/ demonstrates competency  Demonstrated degree of understanding via:   Teach back Learning barriers: . None  Willingness to learn/ readiness for change: . Eager, change in progress  Monitoring and Evaluation:  Dietary intake, exercise, BG control, and body weight      follow up: 11/05/16

## 2016-10-06 DIAGNOSIS — R69 Illness, unspecified: Secondary | ICD-10-CM | POA: Diagnosis not present

## 2016-11-05 ENCOUNTER — Encounter: Payer: Medicare HMO | Attending: Internal Medicine | Admitting: Dietician

## 2016-11-05 VITALS — Ht 60.0 in | Wt 128.2 lb

## 2016-11-05 DIAGNOSIS — E119 Type 2 diabetes mellitus without complications: Secondary | ICD-10-CM | POA: Insufficient documentation

## 2016-11-05 DIAGNOSIS — Z713 Dietary counseling and surveillance: Secondary | ICD-10-CM | POA: Diagnosis not present

## 2016-11-05 DIAGNOSIS — E1122 Type 2 diabetes mellitus with diabetic chronic kidney disease: Secondary | ICD-10-CM

## 2016-11-05 DIAGNOSIS — N183 Chronic kidney disease, stage 3 (moderate): Secondary | ICD-10-CM

## 2016-11-05 NOTE — Progress Notes (Signed)
Medical Nutrition Therapy: Visit start time: 4825  end time: 1115  Assessment:  Diagnosis: diabetes, CKD stage 3 Medical history changes: no changes per patient Psychosocial issues/ stress concerns: none  Current weight: 128.2lbs  Height: 5'0" Medications, supplement changes: no changes  Progress and evaluation: Weight has decreased by 1lb. since previous visit on 09/24/16. Patient reports following goals set at that visit, including eating on a regular schedule, allowing for snacks when hungry, and eating nutritionally balanced meals. She continues to test fasting BGs and reports all have been less than 100 since 09/24/16. She has not testing any post-meal BGs  Physical activity: strength training 1-2 times per week + walking 30-60 minutes, 4-6 days per week.   Dietary Intake:  Usual eating pattern includes 3 meals and 0-2 snacks per day. Dining out frequency: not assessed today.  Breakfast: 1 slice bread, 1 slice cheese, 1 fruit I.e. 1/2 banana Snack: none  Lunch: 1 slice bread, 1 slice American cheese, 1 fruit Snack: none or 1 fruit Supper: grilled chicken and vegetables Snack: none or fruit or crackers Beverages: water, coffee with milk and Splenda  Nutrition Care Education: Topics covered: diabetes, weight management  Weight control: reasonable weight and rate of weight loss, importance of adequate nutritional intake Diabetes: goals for BGs, benefits of testing after supper or before bedtime; reviewed carb choices and role of whole grains and fiber in controlling blood sugars.   Nutritional Diagnosis:  [place]-2.2 Altered nutrition-related laboratory As related to history of hyperglycemia.  As evidenced by HbA1C of 9.5%.  Intervention: Discussion as noted above.   Commended patent for positive changes she has made.    Updated goals with direction from patient.    Follow-up to be determined after patient has labwork done next week.   Education Materials given:  Marland Kitchen Goals/  instructions  Learner/ who was taught:  . Patient   Level of understanding: Marland Kitchen Verbalizes/ demonstrates competency  Demonstrated degree of understanding via:   Teach back Learning barriers: . None  Willingness to learn/ readiness for change: . Eager, change in progress  Monitoring and Evaluation:  Dietary intake, exercise, BG control, kidney function, and body weight      follow up: prn

## 2016-11-05 NOTE — Patient Instructions (Addendum)
   Try [employer], the "White done Right" bread is healthier white bread.   Check some blood sugars at night, 2 hours after supper or before bedtime.   Goal for blood sugar after supper is 150 or less; at bedtime goal is 90-150.   If your blood sugar is 90 or less than 90 close to bedtime, make sure to eat a snack with some carb and protein. Examples: bread or crackers and cheese or peanut butter, fruit and nuts or cheese, a cup of yogurt.   Keep up your healthy eating habits and exercise, you are doing a great job!

## 2016-11-12 DIAGNOSIS — N183 Chronic kidney disease, stage 3 (moderate): Secondary | ICD-10-CM | POA: Diagnosis not present

## 2016-11-12 DIAGNOSIS — E1122 Type 2 diabetes mellitus with diabetic chronic kidney disease: Secondary | ICD-10-CM | POA: Diagnosis not present

## 2016-11-12 DIAGNOSIS — E1165 Type 2 diabetes mellitus with hyperglycemia: Secondary | ICD-10-CM | POA: Diagnosis not present

## 2016-11-19 DIAGNOSIS — R69 Illness, unspecified: Secondary | ICD-10-CM | POA: Diagnosis not present

## 2016-11-19 DIAGNOSIS — E1122 Type 2 diabetes mellitus with diabetic chronic kidney disease: Secondary | ICD-10-CM | POA: Diagnosis not present

## 2016-11-19 DIAGNOSIS — E1165 Type 2 diabetes mellitus with hyperglycemia: Secondary | ICD-10-CM | POA: Diagnosis not present

## 2016-11-19 DIAGNOSIS — N183 Chronic kidney disease, stage 3 (moderate): Secondary | ICD-10-CM | POA: Diagnosis not present

## 2017-01-01 DIAGNOSIS — R69 Illness, unspecified: Secondary | ICD-10-CM | POA: Diagnosis not present

## 2017-01-14 ENCOUNTER — Ambulatory Visit: Payer: Medicare HMO | Admitting: Family Medicine

## 2017-01-18 ENCOUNTER — Ambulatory Visit (INDEPENDENT_AMBULATORY_CARE_PROVIDER_SITE_OTHER): Payer: Medicare HMO | Admitting: Family Medicine

## 2017-01-18 ENCOUNTER — Encounter: Payer: Self-pay | Admitting: Family Medicine

## 2017-01-18 VITALS — BP 130/82 | HR 78 | Temp 97.6°F | Wt 128.4 lb

## 2017-01-18 DIAGNOSIS — Z860100 Personal history of colon polyps, unspecified: Secondary | ICD-10-CM

## 2017-01-18 DIAGNOSIS — R1013 Epigastric pain: Secondary | ICD-10-CM

## 2017-01-18 DIAGNOSIS — E1129 Type 2 diabetes mellitus with other diabetic kidney complication: Secondary | ICD-10-CM

## 2017-01-18 DIAGNOSIS — J45909 Unspecified asthma, uncomplicated: Secondary | ICD-10-CM

## 2017-01-18 DIAGNOSIS — IMO0002 Reserved for concepts with insufficient information to code with codable children: Secondary | ICD-10-CM

## 2017-01-18 DIAGNOSIS — E1165 Type 2 diabetes mellitus with hyperglycemia: Secondary | ICD-10-CM | POA: Diagnosis not present

## 2017-01-18 DIAGNOSIS — I10 Essential (primary) hypertension: Secondary | ICD-10-CM | POA: Diagnosis not present

## 2017-01-18 DIAGNOSIS — E785 Hyperlipidemia, unspecified: Secondary | ICD-10-CM

## 2017-01-18 MED ORDER — PANTOPRAZOLE SODIUM 40 MG PO TBEC: 40.0000 mg | DELAYED_RELEASE_TABLET | Freq: Every day | ORAL | 1 refills | Status: DC

## 2017-01-18 NOTE — Progress Notes (Signed)
Felicia Rumps, MD Phone: [phone]  Felicia Roberts is a 76 y.o. female who presents today for follow-up.  Hypertension: Not checking. Taking amlodipine and lisinopril. No chest pain or shortness of breath. No edema.  Diabetes: Notes on a few occasions has had some lows when she wakes up in the morning. She's decreased her metformin to once at night and this has been beneficial for that. Notes her recent A1c with endocrinology was improved at 7.7. She notes she is currently just taking glyburide and metformin. No changes in urination. No polydipsia. Hypoglycemia improves with breakfast.  Hyperlipidemia: Taking Lipitor. No right upper quadrant pain or myalgias.  Asthma: Taking Qvar. No issue since she started on this. She notes she's not had to use her albuterol. She's not had any flares. No cough or nighttime symptoms.  Patient notes intermittent epigastric discomfort that was previously evaluated by Dr. Lacinda Axon with lab work that was unremarkable for cause. Treated with Protonix and improved. Possible gastritis. Notes it went away with medication though has come back slightly. Does not occur every day.  She notes a history of colon polyps.  PMH: nonsmoker.   ROS see history of present illness  Objective  Physical Exam Vitals:   01/18/17 0947  BP: 130/82  Pulse: 78  Temp: 97.6 F (36.4 C)  SpO2: 98%    BP Readings from Last 3 Encounters:  01/18/17 130/82  07/09/16 (!) 150/90  04/06/16 (!) 149/90   Wt Readings from Last 3 Encounters:  01/18/17 128 lb 6.4 oz (58.2 kg)  11/05/16 128 lb 3.2 oz (58.2 kg)  09/24/16 129 lb 3.2 oz (58.6 kg)    Physical Exam  Constitutional: No distress.  Cardiovascular: Normal rate, regular rhythm and normal heart sounds.   Pulmonary/Chest: Effort normal and breath sounds normal.  Abdominal: Soft. Bowel sounds are normal. She exhibits no distension. There is no tenderness. There is no rebound and no guarding.  Musculoskeletal: She  exhibits no edema.  Neurological: She is alert. Gait normal.  Skin: Skin is warm and dry. She is not diaphoretic.   Diabetic Foot Exam - Simple   Simple Foot Form Diabetic Foot exam was performed with the following findings:  Yes 01/18/2017 10:14 AM  Visual Inspection No deformities, no ulcerations, no other skin breakdown bilaterally:  Yes Sensation Testing Intact to touch and monofilament testing bilaterally:  Yes Pulse Check Posterior Tibialis and Dorsalis pulse intact bilaterally:  Yes Comments      Assessment/Plan: Please see individual problem list.  Essential hypertension Well-controlled. Continue current regimen. Recent BMP through endocrinology.  Asthma Has responded well to Qvar. She'll continue this. If she has an exacerbation she'll let us know.  DM (diabetes mellitus), type 2, uncontrolled, with renal complications ([place]) Improved control recently. She'll continue to follow with endocrinology.  Hyperlipidemia Continue Lipitor.  Epigastric pain Suspect gastritis given this improved with Protonix. Will restart Protonix. Will refer to GI for further evaluation.  History of colon polyps Refer to GI to consider repeat colonoscopy.   Orders Placed This Encounter  Procedures  . Ambulatory referral to Gastroenterology    Referral Priority:   Routine    Referral Type:   Consultation    Referral Reason:   Specialty Services Required    Number of Visits Requested:   1    Meds ordered this encounter  Medications  . pantoprazole (PROTONIX) 40 MG tablet    Sig: Take 1 tablet (40 mg total) by mouth daily.    Dispense:  30 tablet  Refill:  1    # Healthcare maintenance: Reports history of 2 pneumonia vaccines.   Felicia Rumps, MD [place]

## 2017-01-18 NOTE — Assessment & Plan Note (Signed)
Suspect gastritis given this improved with Protonix. Will restart Protonix. Will refer to GI for further evaluation.

## 2017-01-18 NOTE — Assessment & Plan Note (Signed)
Has responded well to Qvar. She'll continue this. If she has an exacerbation she'll let us know.

## 2017-01-18 NOTE — Assessment & Plan Note (Signed)
Refer to GI to consider repeat colonoscopy.

## 2017-01-18 NOTE — Patient Instructions (Signed)
Nice to see you. Please continue your current medication regimen.  We will give you refer to GI for evaluation of your epigastric pain and consider colonoscopy.

## 2017-01-18 NOTE — Assessment & Plan Note (Signed)
Improved control recently. She'll continue to follow with endocrinology.

## 2017-01-18 NOTE — Assessment & Plan Note (Signed)
-  Continue Lipitor °

## 2017-01-18 NOTE — Assessment & Plan Note (Addendum)
Well-controlled. Continue current regimen. Recent BMP through endocrinology.

## 2017-02-01 ENCOUNTER — Encounter: Payer: Self-pay | Admitting: Gastroenterology

## 2017-02-01 ENCOUNTER — Ambulatory Visit (INDEPENDENT_AMBULATORY_CARE_PROVIDER_SITE_OTHER): Payer: Medicare HMO | Admitting: Gastroenterology

## 2017-02-01 VITALS — BP 135/89 | HR 84 | Temp 97.6°F | Ht 61.0 in | Wt 126.8 lb

## 2017-02-01 DIAGNOSIS — R1013 Epigastric pain: Secondary | ICD-10-CM

## 2017-02-01 DIAGNOSIS — K649 Unspecified hemorrhoids: Secondary | ICD-10-CM | POA: Diagnosis not present

## 2017-02-01 DIAGNOSIS — G8929 Other chronic pain: Secondary | ICD-10-CM | POA: Diagnosis not present

## 2017-02-01 DIAGNOSIS — Z86010 Personal history of colonic polyps: Secondary | ICD-10-CM

## 2017-02-01 MED ORDER — HYDROCORTISONE ACETATE 25 MG RE SUPP: 25.0000 mg | Freq: Every day | RECTAL | 0 refills | Status: AC

## 2017-02-01 NOTE — Progress Notes (Signed)
Vonda Antigua, MD [address] [place], [place], [place], [id number], [place], [place], [place], [address] Phone: [phone]  Fax: [phone]  Consultation  Referring Provider:     Leone Haven, MD Primary Care Physician:  Leone Haven, MD Primary Gastroenterologist:  Virgel Manifold, MD        Reason for Consultation:     Epigastric pain  Date of Consultation:  02/01/2017         HPI:   Felicia Roberts is a 76 y.o. female referred for epigastric pain that has resolved with Protonix that was started a week ago.  Patient states 3-4 months ago she had midepigastric abdominal pain and her primary care doctor gave her Protonix which she took for 1-2 months and that completely resolved her pain and she stopped the medication on her own.  The pain reoccurred and she told her primary care provider who then restarted her Protonix a week ago and her pain has now resolved. Was 5/10, cramping when it started.  She has no weight loss or nausea vomiting.  She has hemorrhoids that she feels on the outside that she states get inflamed once a month and are painful.  She reports constipation as well.  She does not take anything for her bowel movements.  She had a colonoscopy in March 2014 in [place] the report of which was obtained and shows that 1 flat 2 mm polyp was removed from the rectum.  Pathology report is not available.  Repeat was recommended in 3 years (indication for repeat in 3 years unknown).  Colonoscopy prep status is not listed in the colonoscopy report.  No family history of colon cancer.  Patient states she was having a colonoscopy every 2-3 years when she lived in [place] and the first colonoscopy she had 3 polyps and since then 2 polyps each time subsequently.  Past Medical History:  Diagnosis Date  . Asthma   . Chicken pox   . Colon polyps   . Diabetes mellitus without complication ([place])   . Diverticulosis   . Hyperlipidemia   .  Hypertension     Past Surgical History:  Procedure Laterality Date  . BLADDER SURGERY      Prior to Admission medications   Medication Sig Start Date End Date Taking? Authorizing Provider  albuterol (PROAIR HFA) 108 (90 Base) MCG/ACT inhaler Inhale 1-2 puffs into the lungs every 6 (six) hours as needed for wheezing or shortness of breath. 01/04/16  Yes Cook, Jayce G, DO  albuterol (PROVENTIL) (2.5 MG/3ML) 0.083% nebulizer solution Take 3 mLs (2.5 mg total) by nebulization every 6 (six) hours as needed for wheezing or shortness of breath. 01/04/16  Yes Cook, Jayce G, DO  amLODipine (NORVASC) 5 MG tablet Take 1 tablet (5 mg total) by mouth daily. 05/10/16  Yes Cook, Jayce G, DO  atorvastatin (LIPITOR) 40 MG tablet TAKE ONE TABLET BY MOUTH ONCE DAILY 07/16/16  Yes Cook, Jayce G, DO  beclomethasone (QVAR) 80 MCG/ACT inhaler Inhale 1 puff into the lungs 2 (two) times daily. 07/09/16  Yes Cook, Jayce G, DO  glyBURIDE (DIABETA) 5 MG tablet Take 1 tablet (5 mg total) by mouth 2 (two) times daily. 04/26/15  Yes Mar Daring, PA-C  lisinopril (PRINIVIL,ZESTRIL) 20 MG tablet Take 1 tablet (20 mg total) by mouth daily. 05/10/16  Yes Cook, Jayce G, DO  metFORMIN (GLUCOPHAGE-XR) 500 MG 24 hr tablet Take 2 tablets (1,000 mg total) by mouth daily  with supper. 04/06/16 04/06/17 Yes Cook, Jayce G, DO  pantoprazole (PROTONIX) 40 MG tablet Take 1 tablet (40 mg total) by mouth daily. 01/18/17  Yes Leone Haven, MD  pioglitazone (ACTOS) 15 MG tablet Take 1 tablet (15 mg total) by mouth daily. 04/06/16 04/06/17 Yes Cook, Jayce G, DO  polyethylene glycol powder (GLYCOLAX/MIRALAX) powder Take 17 g by mouth 2 (two) times daily as needed. 06/17/15  Yes Cook, Jayce G, DO  hydrocortisone (ANUSOL-HC) 25 MG suppository Place 1 suppository (25 mg total) at bedtime for 14 doses rectally. 02/01/17 02/15/17  Virgel Manifold, MD    Family History  Problem Relation Age of Onset  . Cancer Father        prostate  .  Diabetes Sister   . Hypertension Sister   . AAA (abdominal aortic aneurysm) Brother   . Diabetes Sister   . Hypertension Sister   . Diabetes Sister   . Hypertension Sister   . Diabetes Sister   . Hypertension Sister   . Hypertension Mother   . Heart disease Mother   . Diabetes Mother   . Breast cancer Neg Hx      Social History   Tobacco Use  . Smoking status: Never Smoker  . Smokeless tobacco: Never Used  Substance Use Topics  . Alcohol use: No  . Drug use: No    Allergies as of 02/01/2017 - Review Complete 02/01/2017  Allergen Reaction Noted  . Januvia [sitagliptin] Rash 05/09/2015    Review of Systems:    All systems reviewed and negative except where noted in HPI.   Physical Exam:  Vital signs in last 24 hours: @VSRANGES @   General:   Pleasant, cooperative in NAD Head:  Normocephalic and atraumatic. Eyes:   No icterus.   Conjunctiva pink. PERRLA. Ears:  Normal auditory acuity. Neck:  Supple; no masses or thyroidomegaly Lungs: Respirations even and unlabored. Lungs clear to auscultation bilaterally.   No wheezes, crackles, or rhonchi.  Heart:  Regular rate and rhythm;  Without murmur, clicks, rubs or gallops Abdomen:  Soft, nondistended, nontender. Normal bowel sounds. No appreciable masses or hepatomegaly.  No rebound or guarding.  Rectal: External hemorrhoid present, noninflamed, nontender.  Digital rectal exam showed brown stool. Neurologic:  Alert and oriented x3;  grossly normal neurologically. Skin:  Intact without significant lesions or rashes. Cervical Nodes:  No significant cervical adenopathy. Psych:  Alert and cooperative. Normal affect.  LAB RESULTS: No results for input(s): WBC, HGB, HCT, PLT in the last 72 hours. BMET No results for input(s): NA, K, CL, CO2, GLUCOSE, BUN, CREATININE, CALCIUM in the last 72 hours. LFT No results for input(s): PROT, ALBUMIN, AST, ALT, ALKPHOS, BILITOT, BILIDIR, IBILI in the last 72 hours. PT/INR No results for  input(s): LABPROT, INR in the last 72 hours. Labs from January 2018 reviewed and showed no anemia  STUDIES: No results found. Colonoscopy report from March 2014 reviewed as stated in HPI   Impression / Plan:   Felicia Roberts is a 76 y.o. y/o female with midepigastric pain that has resolved with once daily Protonix with no alarm symptoms present presents for initial visit  Midepigastric pain Likely due to acid reflux Continue Protonix daily Patient was educated in detail about adverse effects of Protonix including C. difficile diarrhea, pneumonia, kidney injury.  Verbalized understanding and chooses to continue the medication Educated on lifestyle modifications to help with acid reflux Long-term goal would be to decrease or discontinue Protonix after 4-6 weeks and then can  transition to Zantac if needed if symptoms recur We will obtain stool for H. pylori.  Patient was specifically instructed to only do this after 2 weeks of stopping Protonix.  Patient will complete her current 30-day dose of Protonix and then get the stool testing done 2 weeks later.  She was able to repeat the instructions back to me correctly.  External hemorrhoids/constipation Recommended to start taking MiraLAX daily to maintain soft stools every day High-fiber diet Hemorrhoid suppository prescribed Surgery referral given for evaluation for hemorrhoidectomy if her symptoms do not improve with the above measures as patient reports pain with inflamed hemorrhoids once a month  Polyp surveillance Last colonoscopy report recommends repeat colonoscopy in 3 years however indication for this is not listed.  Since one polyp was removed (pathology unknown) as per guidelines and recommendations repeat colonoscopy would then be recommended in 5-10 years for in March 2019 patient will be 5 years out from her last colonoscopy and thus we will schedule her colonoscopy for March 2019 per guidelines.  No family history of colon  cancer, no alarm symptoms to indicate sooner colonoscopy at this time. I have discussed alternative options, risks & benefits,  which include, but are not limited to, bleeding, infection, perforation,respiratory complication & drug reaction.  The patient agrees with this plan & written consent will be obtained.    Thank you for involving me in the care of this patient.     Virgel Manifold, MD  02/01/2017, 12:11 PM

## 2017-02-22 ENCOUNTER — Ambulatory Visit (INDEPENDENT_AMBULATORY_CARE_PROVIDER_SITE_OTHER): Payer: Medicare HMO | Admitting: Surgery

## 2017-02-22 ENCOUNTER — Encounter: Payer: Self-pay | Admitting: Surgery

## 2017-02-22 VITALS — BP 137/78 | HR 80 | Temp 98.3°F | Ht 61.0 in | Wt 127.0 lb

## 2017-02-22 DIAGNOSIS — K649 Unspecified hemorrhoids: Secondary | ICD-10-CM | POA: Diagnosis not present

## 2017-02-22 NOTE — Patient Instructions (Addendum)
We would like for you to use baby wipes instead of using tissue paper to wipe with.  Please do sitz baths daily. See instructions listed below.  You may use vaseline to help sooth your rectal area.  Please see your follow up appointment listed below.   Disposable Sitz Bath A disposable sitz bath is a plastic basin that fits over the toilet. A bag is hung above the toilet, and the bag is connected to a tube that opens into the basin. The bag is filled with warm water that flows into the basin through the tube. A sitz bath can be used to help relieve symptoms, clean, and promote healing in the genital and anal areas, as well as in the lower abdomen and buttocks. What are the risks? Sitz baths are generally very safe. It is possible for the skin between the genitals and the anus (perineum) to become infected, but this is rare. You can avoid this by cleaning your sitz bath supplies thoroughly. How to use a disposable sitz bath 1. Close the clamp on the tube. Make sure the clamp is closed tightly to prevent leakage. 2. Fill the sitz bath basin and the plastic bag with warm water. The water should be warm enough to be comfortable, but not hot. 3. Raise the toilet seat and place the filled basin on the toilet. Make sure the overflow opening is facing toward the back of the toilet. ? If you prefer, you may place the empty basin on the toilet first, and then use the plastic bag to fill the basin with warm water. 4. Hang the filled plastic bag overhead on a hook or towel rack close to the toilet. The bag should be higher than the toilet so that the water will flow down through the tube. 5. Attach the tube to the opening on the basin. Make sure that the tube is attached to the basin tightly to prevent leakage. 6. Sit on the basin and release the clamp. This will allow warm water to flow into the basin and flush the area around your genitals and anus. 7. Remain sitting on the basin for about 15-20 minutes,  or as long as told by your health care provider. 8. Stand up and gently pat your skin dry. If directed, apply clean bandages (dressings) to the affected area as told by your health care provider. 9. Carefully remove the basin from the toilet seat and tip the basin into the toilet to empty any remaining water. Empty any remaining water from the plastic bag into the toilet. Then, flush the toilet. 10. Wash the basin with warm water and soap. Let the basin air dry in the sink. You should also let the plastic bag and the tubing air dry. 11. Store the basin, tubing, and plastic bag in a clean, dry area. 12. Wash your hands with soap and water. If soap and water are not available, use hand sanitizer. Contact a health care provider if:  You have symptoms that get worse instead of better.  You develop new skin irritation, redness, or swelling around your genitals or anus. This information is not intended to replace advice given to you by your health care provider. Make sure you discuss any questions you have with your health care provider. Document Released: 09/11/2011 Document Revised: 08/18/2015 Document Reviewed: 01/30/2015 Elsevier Interactive Patient Education  2018 [place] bath  How to Take a [employer] A sitz bath is a warm water bath that is taken while you  are sitting down. The water should only come up to your hips and should cover your buttocks. Your health care provider may recommend a sitz bath to help you:  Clean the lower part of your body, including your genital area.  With itching.  With pain.  With sore muscles or muscles that tighten or spasm.  How to take a sitz bath Take 3-4 sitz baths per day or as told by your health care provider. 1. Partially fill a bathtub with warm water. You will only need the water to be deep enough to cover your hips and buttocks when you are sitting in it. 2. If your health care provider told you to put medicine in the water, follow the  directions exactly. 3. Sit in the water and open the tub drain a little. 4. Turn on the warm water again to keep the tub at the correct level. Keep the water running constantly. 5. Soak in the water for 15-20 minutes or as told by your health care provider. 6. After the sitz bath, pat the affected area dry first. Do not rub it. 7. Be careful when you stand up after the sitz bath because you may feel dizzy.  Contact a health care provider if:  Your symptoms get worse. Do not continue with sitz baths if your symptoms get worse.  You have new symptoms. Do not continue with sitz baths until you talk with your health care provider. This information is not intended to replace advice given to you by your health care provider. Make sure you discuss any questions you have with your health care provider. Document Released: 12/03/2003 Document Revised: 08/10/2015 Document Reviewed: 03/10/2014 Elsevier Interactive Patient Education  [employer].

## 2017-02-22 NOTE — Progress Notes (Signed)
Patient ID: Felicia Roberts, female   DOB: Jan 28, 1941, 76 y.o.   MRN: [id number]  HPI Felicia Roberts is a 76 y.o. female seen in consultation at the request of Dr.Tahiliani for symptomatic hemorrhoids. He reports his be having some itching discomfort in the perianal area as is sedated with some hematochezia after having hard bowel movements. No previous hemorrhoidal surgery. She was prescribed certain that she could not afford it. She has stress and Preparation H without any success. She hasn't tried any sitz baths yet. She is able to perform more than 6 Mets of activity without any shortness of breath or chest pain. She is scheduled to have a colonoscopy in about 3 months. Hemogram 1 ac was 9.510 months ago. CBC and CMP were normal except her sugar that was 170.  HPI  Past Medical History:  Diagnosis Date  . Asthma   . Chicken pox   . Colon polyps   . Diabetes mellitus without complication ([place])   . Diverticulosis   . Hyperlipidemia   . Hypertension     Past Surgical History:  Procedure Laterality Date  . BLADDER SURGERY      Family History  Problem Relation Age of Onset  . Cancer Father        prostate  . Diabetes Sister   . Hypertension Sister   . AAA (abdominal aortic aneurysm) Brother   . Diabetes Sister   . Hypertension Sister   . Diabetes Sister   . Hypertension Sister   . Diabetes Sister   . Hypertension Sister   . Hypertension Mother   . Heart disease Mother   . Diabetes Mother   . Breast cancer Neg Hx     Social History Social History   Tobacco Use  . Smoking status: Never Smoker  . Smokeless tobacco: Never Used  Substance Use Topics  . Alcohol use: No  . Drug use: No    Allergies  Allergen Reactions  . Januvia [Sitagliptin] Rash    Current Outpatient Medications  Medication Sig Dispense Refill  . albuterol (PROAIR HFA) 108 (90 Base) MCG/ACT inhaler Inhale 1-2 puffs into the lungs every 6 (six) hours as needed for wheezing or shortness of  breath. 18 g 6  . albuterol (PROVENTIL) (2.5 MG/3ML) 0.083% nebulizer solution Take 3 mLs (2.5 mg total) by nebulization every 6 (six) hours as needed for wheezing or shortness of breath. 150 mL 1  . amLODipine (NORVASC) 5 MG tablet Take 1 tablet (5 mg total) by mouth daily. 90 tablet 3  . atorvastatin (LIPITOR) 40 MG tablet TAKE ONE TABLET BY MOUTH ONCE DAILY 90 tablet 3  . beclomethasone (QVAR) 80 MCG/ACT inhaler Inhale 1 puff into the lungs 2 (two) times daily. 1 Inhaler 12  . glyBURIDE (DIABETA) 5 MG tablet Take 1 tablet (5 mg total) by mouth 2 (two) times daily. 180 tablet 3  . lisinopril (PRINIVIL,ZESTRIL) 20 MG tablet Take 1 tablet (20 mg total) by mouth daily. 90 tablet 3  . metFORMIN (GLUCOPHAGE-XR) 500 MG 24 hr tablet Take 2 tablets (1,000 mg total) by mouth daily with supper. 180 tablet 3  . pantoprazole (PROTONIX) 40 MG tablet Take 1 tablet (40 mg total) by mouth daily. 30 tablet 1  . pioglitazone (ACTOS) 15 MG tablet Take 1 tablet (15 mg total) by mouth daily. 90 tablet 3  . polyethylene glycol powder (GLYCOLAX/MIRALAX) powder Take 17 g by mouth 2 (two) times daily as needed. 3350 g 1   No current facility-administered medications for this  visit.      Review of Systems Full ROS  was asked and was negative except for the information on the HPI  Physical Exam Blood pressure 137/78, pulse 80, temperature 98.3 F (36.8 C), temperature source Oral, height 5\' 1"  (1.549 m), weight 57.6 kg (127 lb). CONSTITUTIONAL: NAD  EYES: Pupils are equal, round, and reactive to light, Sclera are non-icteric. EARS, NOSE, MOUTH AND THROAT: The oropharynx is clear. The oral mucosa is pink and moist. Hearing is intact to voice. LYMPH NODES:  Lymph nodes in the neck are normal. RESPIRATORY:  Lungs are clear. There is normal respiratory effort, with equal breath sounds bilaterally, and without pathologic use of accessory muscles. CARDIOVASCULAR: Heart is regular without murmurs, gallops, or rubs. GI:  The abdomen is soft, nontender, and nondistended. There are no palpable masses. There is no hepatosplenomegaly. There are normal bowel sounds in all quadrants.  Rectal: grade III hemorrhoids Right anterolateral and right posterolateral , no masses or fissures.   MUSCULOSKELETAL: Normal muscle strength and tone. No cyanosis or edema.   SKIN: Turgor is good and there are no pathologic skin lesions or ulcers. NEUROLOGIC: Motor and sensation is grossly normal. Cranial nerves are grossly intact. PSYCH:  Oriented to person, place and time. Affect is normal.  Data Reviewed.    Assessment /Plan Symptomatic internal hemorrhoids. Discussed with the patient in detail about options for do recommend initiation of medical therapy first for next 3 weeks. Recommended twice a day sitz baths, meticulous hygiene, some Vaseline ointment. I will see her back in 3 weeks and reevaluate for the need for hemorrhoidectomy. A copy of the report will be sent to the referring provider   Caroleen Hamman, MD FACS General Surgeon 02/22/2017, 10:47 AM

## 2017-03-21 ENCOUNTER — Ambulatory Visit (INDEPENDENT_AMBULATORY_CARE_PROVIDER_SITE_OTHER): Payer: Medicare HMO | Admitting: Surgery

## 2017-03-21 ENCOUNTER — Encounter: Payer: Self-pay | Admitting: Surgery

## 2017-03-21 VITALS — BP 127/81 | HR 82 | Temp 97.9°F | Wt 127.0 lb

## 2017-03-21 DIAGNOSIS — K649 Unspecified hemorrhoids: Secondary | ICD-10-CM

## 2017-03-21 NOTE — Patient Instructions (Signed)
Por favor continue haciendo le recomendado. Si tiene preguntas, por favor llamenos.

## 2017-03-21 NOTE — Progress Notes (Signed)
Felicia Roberts is following up for symptomatic internal hemorrhoids. After medical treatment she noticed significant improvement of her condition. She still has some intermittent mild pain. No fevers or chills no hematochezia.  PE : NAD Rectal exam revealed posterior midline and right posterolateral grade 3 internal hemorrhoids. No masses  A/P and right internal hemorrhoids. She wishes to continue medical management I will have her follow up in 4-5 weeks and at that time she may decide whether or not she was to have a hemorrhoidectomy. I/ve tspent about 15 minutes in this encounter with greater than 50% of time spent in counseling and coordination of her care

## 2017-04-19 DIAGNOSIS — N183 Chronic kidney disease, stage 3 (moderate): Secondary | ICD-10-CM | POA: Diagnosis not present

## 2017-04-19 DIAGNOSIS — E1122 Type 2 diabetes mellitus with diabetic chronic kidney disease: Secondary | ICD-10-CM | POA: Diagnosis not present

## 2017-04-19 DIAGNOSIS — N898 Other specified noninflammatory disorders of vagina: Secondary | ICD-10-CM | POA: Diagnosis not present

## 2017-05-03 ENCOUNTER — Ambulatory Visit (INDEPENDENT_AMBULATORY_CARE_PROVIDER_SITE_OTHER): Payer: Medicare HMO

## 2017-05-03 ENCOUNTER — Other Ambulatory Visit: Payer: Self-pay | Admitting: Family Medicine

## 2017-05-03 VITALS — BP 110/70 | HR 71 | Temp 98.1°F | Resp 12 | Ht 59.0 in | Wt 129.0 lb

## 2017-05-03 DIAGNOSIS — Z23 Encounter for immunization: Secondary | ICD-10-CM | POA: Diagnosis not present

## 2017-05-03 DIAGNOSIS — Z0000 Encounter for general adult medical examination without abnormal findings: Secondary | ICD-10-CM | POA: Diagnosis not present

## 2017-05-03 DIAGNOSIS — I10 Essential (primary) hypertension: Secondary | ICD-10-CM

## 2017-05-03 NOTE — Patient Instructions (Addendum)
  [name] , Thank you for taking time to come for your Medicare Wellness Visit. I appreciate your ongoing commitment to your health goals. Please review the following plan we discussed and let me know if I can assist you in the future.   Follow up with Dr. Caryl Bis as needed.    Bring a copy of your [place] and/or Living Will to be scanned into chart.  Have a great day!  These are the goals we discussed: Goals    . Maintain Healthy Lifestyle     Stay hydrated and drink plenty of water Continue to walk for exercise  Low carb foods       This is a list of the screening recommended for you and due dates:  Health Maintenance  Topic Date Due  . Tetanus Vaccine  01/16/1960  . Hemoglobin A1C  05/15/2017  . Eye exam for diabetics  08/06/2017  . Complete foot exam   01/18/2018  . Pneumonia vaccines (2 of 2 - PPSV23) 05/03/2018  . Flu Shot  Completed  . DEXA scan (bone density measurement)  Completed

## 2017-05-03 NOTE — Progress Notes (Signed)
Subjective:   Felicia Roberts is a 77 y.o. female who presents for an Initial Medicare Annual Wellness Visit.  Review of Systems    No ROS.  Medicare Wellness Visit. Additional risk factors are reflected in the social history.  Cardiac Risk Factors include: hypertension;diabetes mellitus;advanced age (>97men, >91 women)     Objective:    Today's Vitals   05/03/17 1510  BP: 110/70  Pulse: 71  Resp: 12  Temp: 98.1 F (36.7 C)  TempSrc: Oral  SpO2: 98%  Weight: 129 lb (58.5 kg)  Height: 4\' 11"  (1.499 m)   Body mass index is 26.05 kg/m.  Advanced Directives 05/03/2017 09/24/2016 11/04/2015 08/15/2015 06/06/2015  Does Patient Have a Medical Advance Directive? No No No No No  Would patient like information on creating a medical advance directive? Yes (MAU/Ambulatory/Procedural Areas - Information given) Yes (MAU/Ambulatory/Procedural Areas - Information given) No - patient declined information No - patient declined information -    Current Medications (verified) Outpatient Encounter Medications as of 05/03/2017  Medication Sig  . albuterol (PROAIR HFA) 108 (90 Base) MCG/ACT inhaler Inhale 1-2 puffs into the lungs every 6 (six) hours as needed for wheezing or shortness of breath.  Marland Kitchen albuterol (PROVENTIL) (2.5 MG/3ML) 0.083% nebulizer solution Take 3 mLs (2.5 mg total) by nebulization every 6 (six) hours as needed for wheezing or shortness of breath.  Marland Kitchen amLODipine (NORVASC) 5 MG tablet Take 1 tablet (5 mg total) by mouth daily.  Marland Kitchen atorvastatin (LIPITOR) 40 MG tablet TAKE ONE TABLET BY MOUTH ONCE DAILY  . beclomethasone (QVAR) 80 MCG/ACT inhaler Inhale 1 puff into the lungs 2 (two) times daily.  Marland Kitchen glyBURIDE (DIABETA) 5 MG tablet Take 1 tablet (5 mg total) by mouth 2 (two) times daily.  Marland Kitchen lisinopril (PRINIVIL,ZESTRIL) 20 MG tablet Take 1 tablet (20 mg total) by mouth daily.  . polyethylene glycol powder (GLYCOLAX/MIRALAX) powder Take 17 g by mouth 2 (two) times daily as needed.  .  [DISCONTINUED] pantoprazole (PROTONIX) 40 MG tablet Take 1 tablet (40 mg total) by mouth daily.  . metFORMIN (GLUCOPHAGE-XR) 500 MG 24 hr tablet Take 2 tablets (1,000 mg total) by mouth daily with supper.  . pioglitazone (ACTOS) 15 MG tablet Take 1 tablet (15 mg total) by mouth daily.   No facility-administered encounter medications on file as of 05/03/2017.     Allergies (verified) Januvia [sitagliptin]   History: Past Medical History:  Diagnosis Date  . Asthma   . Chicken pox   . Colon polyps   . Diabetes mellitus without complication ([place])   . Diverticulosis   . Hyperlipidemia   . Hypertension    Past Surgical History:  Procedure Laterality Date  . BLADDER SURGERY     Family History  Problem Relation Age of Onset  . Cancer Father        prostate  . Diabetes Sister   . Hypertension Sister   . AAA (abdominal aortic aneurysm) Brother   . Diabetes Sister   . Hypertension Sister   . Diabetes Sister   . Hypertension Sister   . Diabetes Sister   . Hypertension Sister   . Hypertension Mother   . Heart disease Mother   . Diabetes Mother   . Breast cancer Neg Hx    Social History   Socioeconomic History  . Marital status: Single    Spouse name: None  . Number of children: None  . Years of education: None  . Highest education level: None  Social Needs  .  Financial resource strain: None  . Food insecurity - worry: None  . Food insecurity - inability: None  . Transportation needs - medical: None  . Transportation needs - non-medical: None  Occupational History  . None  Tobacco Use  . Smoking status: Never Smoker  . Smokeless tobacco: Never Used  Substance and Sexual Activity  . Alcohol use: No  . Drug use: No  . Sexual activity: None  Other Topics Concern  . None  Social History Narrative  . None    Tobacco Counseling Counseling given: Not Answered   Clinical Intake:  Pre-visit preparation completed: Yes  Pain : No/denies pain     Nutritional  Status: BMI of 19-24  Normal Diabetes: Yes(Followed by Endocrinology, Dr. Gabriel Carina.)  How often do you need to have someone help you when you read instructions, pamphlets, or other written materials from your doctor or pharmacy?: 1 - Never  Interpreter Needed?: No      Activities of Daily Living In your present state of health, do you have any difficulty performing the following activities: 05/03/2017  Hearing? N  Vision? N  Difficulty concentrating or making decisions? N  Walking or climbing stairs? N  Dressing or bathing? N  Doing errands, shopping? N  Preparing Food and eating ? N  Using the Toilet? N  In the past six months, have you accidently leaked urine? N  Do you have problems with loss of bowel control? N  Managing your Medications? N  Managing your Finances? N  Housekeeping or managing your Housekeeping? N  Some recent data might be hidden     Immunizations and Health Maintenance Immunization History  Administered Date(s) Administered  . Influenza-Unspecified 01/17/2016, 01/01/2017  . Pneumococcal Conjugate-13 05/03/2017  . Zoster 06/17/2015   Health Maintenance Due  Topic Date Due  . TETANUS/TDAP  01/16/1960    Patient Care Team: Leone Haven, MD as PCP - General (Family Medicine) Perrin Maltese, MD as Referring Physician (Internal Medicine) Christene Lye, MD (General Surgery)  Indicate any recent Medical Services you may have received from other than [facility] providers in the past year (date may be approximate).     Assessment:   This is a routine wellness examination for Felicia Roberts.  The goal of the wellness visit is to assist the patient how to close the gaps in care and create a preventative care plan for the patient.   The roster of all physicians providing medical care to patient is listed in the Snapshot section of the chart.  Taking calcium VIT D as appropriate/Osteoporosis risk reviewed.    Safety issues reviewed; Smoke and carbon  monoxide detectors in the home. No firearms or firearms locked in a safe within the home. Wears seatbelts when driving or riding with others. No violence in the home.  They do not have excessive sun exposure.  Discussed the need for sun protection: hats, long sleeves and the use of sunscreen if there is significant sun exposure.  Depression- PHQ 2 &9 complete.  There are no signs/symptoms or verbal communication regarding depression, irritability, anhedonia, sadness/tearfullness. See depression screening.  Not currently undergoing counseling.  No previous treatment for depression.  Patient is alert, normal appearance, oriented to person/place/and time.  Correctly identified the president of the [place] and recalls of 3/3 words. Performs simple calculations and can read correct time from watch face.  Displays appropriate judgement.  No new identified risk were noted.  No failures at ADL's or IADL's.    BMI- discussed  the importance of a healthy diet, water intake and the benefits of aerobic exercise. Educational material provided.   24 hour diet recall: Diabetic diet  Daily fluid intake: 1 cups of caffeine, 4 cups of water, 1 cups of juice/milk.  Dental- every 12 months.  Eye- Visual acuity not assessed per patient preference since they have regular follow up with the ophthalmologist.  Wears corrective lenses.  Sleep patterns- Sleeps 5 hours at night.   Prevnar 13 vaccine administered L deltoid, tolerated well. Educational material provided.  TDAP vaccine deferred per patient preference.  Follow up with insurance.  Educational material provided.  Patient Concerns: None at this time. Follow up with PCP as needed.  Hearing/Vision screen Hearing Screening Comments: Patient is able to hear conversational tones without difficulty.  No issues reported.   Vision Screening Comments: Followed [facility] Wears corrective lenses Last OV 2019 Cataract extraction,  bilateral Visual acuity not assessed per patient preference since they have regular follow up with the ophthalmologist  Dietary issues and exercise activities discussed: Current Exercise Habits: Home exercise routine, Type of exercise: walking, Time (Minutes): 45, Frequency (Times/Week): 5, Weekly Exercise (Minutes/Week): 225, Intensity: Mild  Goals    . Maintain Healthy Lifestyle     Stay hydrated and drink plenty of water Continue to walk for exercise  Low carb foods      Depression Screen PHQ 2/9 Scores 05/03/2017 09/24/2016 11/04/2015 06/17/2015  PHQ - 2 Score 0 0 0 0    Fall Risk Fall Risk  05/03/2017 11/05/2016 09/24/2016 11/22/2015 11/04/2015  Falls in the past year? No No No (No Data) No  Comment - - - no falls since previous visit -     Cognitive Function: MMSE - Mini Mental State Exam 05/03/2017  Orientation to time 5  Orientation to Place 5  Registration 3  Attention/ Calculation 5  Recall 3  Language- name 2 objects 2  Language- repeat 1  Language- follow 3 step command 3  Language- read & follow direction 1  Write a sentence 1  Copy design 1  Total score 30        Screening Tests Health Maintenance  Topic Date Due  . TETANUS/TDAP  01[birth year]16/1960  . HEMOGLOBIN A1C  05/15/2017  . OPHTHALMOLOGY EXAM  08/06/2017  . FOOT EXAM  01/18/2018  . PNA vac Low Risk Adult (2 of 2 - PPSV23) 05/03/2018  . INFLUENZA VACCINE  Completed  . DEXA SCAN  Completed      Plan:   End of life planning; Advanced aging; Advanced directives discussed.  No HCPOA/Living Will.  Additional information provided to help them start the conversation with family.  Copy of HCPOA/Living Will requested upon completion. Time spent on this topic is 20 minutes.  I have personally reviewed and noted the following in the patient's chart:   . Medical and social history . Use of alcohol, tobacco or illicit drugs  . Current medications and supplements . Functional ability and status . Nutritional  status . Physical activity . Advanced directives . List of other physicians . Hospitalizations, surgeries, and ER visits in previous 12 months . Vitals . Screenings to include cognitive, depression, and falls . Referrals and appointments  In addition, I have reviewed and discussed with patient certain preventive protocols, quality metrics, and best practice recommendations. A written personalized care plan for preventive services as well as general preventive health recommendations were provided to patient.     Varney Biles, LPN   08/01/5636

## 2017-05-13 ENCOUNTER — Ambulatory Visit: Payer: Self-pay | Admitting: Surgery

## 2017-05-13 DIAGNOSIS — E1122 Type 2 diabetes mellitus with diabetic chronic kidney disease: Secondary | ICD-10-CM | POA: Diagnosis not present

## 2017-05-13 DIAGNOSIS — N183 Chronic kidney disease, stage 3 (moderate): Secondary | ICD-10-CM | POA: Diagnosis not present

## 2017-06-06 DIAGNOSIS — R69 Illness, unspecified: Secondary | ICD-10-CM | POA: Diagnosis not present

## 2017-06-07 ENCOUNTER — Ambulatory Visit (INDEPENDENT_AMBULATORY_CARE_PROVIDER_SITE_OTHER): Payer: Medicare HMO | Admitting: Surgery

## 2017-06-07 ENCOUNTER — Encounter: Payer: Self-pay | Admitting: Surgery

## 2017-06-07 VITALS — BP 130/78 | HR 69 | Temp 98.3°F | Wt 128.0 lb

## 2017-06-07 DIAGNOSIS — L0591 Pilonidal cyst without abscess: Secondary | ICD-10-CM | POA: Diagnosis not present

## 2017-06-07 MED ORDER — AMOXICILLIN-POT CLAVULANATE 875-125 MG PO TABS: 1.0000 | ORAL_TABLET | Freq: Two times a day (BID) | ORAL | 0 refills | Status: AC

## 2017-06-07 NOTE — Patient Instructions (Signed)
We will see you back as listed below:  We have sent in an antibiotic for you to start.  Please give our office a call if you have any questions or concerns.

## 2017-06-10 ENCOUNTER — Encounter: Payer: Self-pay | Admitting: Surgery

## 2017-06-10 NOTE — Progress Notes (Signed)
Outpatient Surgical Follow Up  06/10/2017  Felicia Roberts is an 77 y.o. female.   Chief Complaint  Patient presents with  . Follow-up    Hemorrhoids    HPI: Felicia Roberts is well-known to me for symptomatic hemorrhoids that improved after medical therapy.  Now she complains of a small knot right above her cleft.  She reports some intermittent mild to moderate sharp pains worsening when she applies pressure in that area.  No fevers or chills no other symptoms  Past Medical History:  Diagnosis Date  . Asthma   . Chicken pox   . Colon polyps   . Diabetes mellitus without complication ([place])   . Diverticulosis   . Hyperlipidemia   . Hypertension     Past Surgical History:  Procedure Laterality Date  . BLADDER SURGERY      Family History  Problem Relation Age of Onset  . Cancer Father        prostate  . Diabetes Sister   . Hypertension Sister   . AAA (abdominal aortic aneurysm) Brother   . Diabetes Sister   . Hypertension Sister   . Diabetes Sister   . Hypertension Sister   . Diabetes Sister   . Hypertension Sister   . Hypertension Mother   . Heart disease Mother   . Diabetes Mother   . Breast cancer Neg Hx     Social History:  reports that  has never smoked. she has never used smokeless tobacco. She reports that she does not drink alcohol or use drugs.  Allergies:  Allergies  Allergen Reactions  . Januvia [Sitagliptin] Rash    Medications reviewed.    ROS Full ROS performed and is otherwise negative other than what is stated in HPI   BP 130/78   Pulse 69   Temp 98.3 F (36.8 C) (Oral)   Wt 58.1 kg (128 lb)   BMI 25.85 kg/m   Physical Exam  Constitutional: She is oriented to person, place, and time and well-developed, well-nourished, and in no distress. No distress.  Pulmonary/Chest: Effort normal. No respiratory distress.  Abdominal: Soft. She exhibits no distension. There is no tenderness. There is no rebound.  Genitourinary:  Genitourinary  Comments: There is some nonthrombosed hemorrhoids more importantly there is a new 2 cm pilonidal cyst with some erythema.  No evidence of discrete abscess.  Musculoskeletal: Normal range of motion.  Neurological: She is alert and oriented to person, place, and time. Gait normal. GCS score is 15.  Skin: Skin is warm and dry. She is not diaphoretic.  Psychiatric: Mood, memory, affect and judgment normal.  Nursing note and vitals reviewed.   Assessment/Plan:  Internal hemorrhoids currently doing well more importantly now a new epidermal inclusion cyst.  Discussed with the patient in detail and present of treatment will be antibiotic therapy with Augmentin.  We will see her back in a couple weeks to reevaluate the cyst.  She understands that if this not improve may need excision versus I&D  Greater than 50% of the 25 minutes  visit was spent in counseling/coordination of care   Caroleen Hamman, MD [place] Surgeon

## 2017-06-19 ENCOUNTER — Ambulatory Visit: Payer: Self-pay | Admitting: Surgery

## 2017-07-11 ENCOUNTER — Ambulatory Visit: Payer: Self-pay | Admitting: Surgery

## 2017-07-19 ENCOUNTER — Ambulatory Visit (INDEPENDENT_AMBULATORY_CARE_PROVIDER_SITE_OTHER): Payer: Medicare HMO | Admitting: Family Medicine

## 2017-07-19 ENCOUNTER — Other Ambulatory Visit: Payer: Self-pay

## 2017-07-19 ENCOUNTER — Encounter: Payer: Self-pay | Admitting: Family Medicine

## 2017-07-19 VITALS — BP 124/70 | HR 70 | Temp 97.5°F | Ht 59.0 in | Wt 133.6 lb

## 2017-07-19 DIAGNOSIS — R1013 Epigastric pain: Secondary | ICD-10-CM | POA: Diagnosis not present

## 2017-07-19 DIAGNOSIS — H9313 Tinnitus, bilateral: Secondary | ICD-10-CM | POA: Diagnosis not present

## 2017-07-19 DIAGNOSIS — K5900 Constipation, unspecified: Secondary | ICD-10-CM

## 2017-07-19 DIAGNOSIS — Z1239 Encounter for other screening for malignant neoplasm of breast: Secondary | ICD-10-CM

## 2017-07-19 DIAGNOSIS — E785 Hyperlipidemia, unspecified: Secondary | ICD-10-CM | POA: Diagnosis not present

## 2017-07-19 DIAGNOSIS — Z1231 Encounter for screening mammogram for malignant neoplasm of breast: Secondary | ICD-10-CM | POA: Diagnosis not present

## 2017-07-19 DIAGNOSIS — IMO0002 Reserved for concepts with insufficient information to code with codable children: Secondary | ICD-10-CM

## 2017-07-19 DIAGNOSIS — E1165 Type 2 diabetes mellitus with hyperglycemia: Secondary | ICD-10-CM

## 2017-07-19 DIAGNOSIS — I10 Essential (primary) hypertension: Secondary | ICD-10-CM | POA: Diagnosis not present

## 2017-07-19 DIAGNOSIS — J4520 Mild intermittent asthma, uncomplicated: Secondary | ICD-10-CM

## 2017-07-19 DIAGNOSIS — E1129 Type 2 diabetes mellitus with other diabetic kidney complication: Secondary | ICD-10-CM

## 2017-07-19 MED ORDER — BECLOMETHASONE DIPROPIONATE 80 MCG/ACT IN AERS: 1.0000 | INHALATION_SPRAY | Freq: Two times a day (BID) | RESPIRATORY_TRACT | 12 refills | Status: DC

## 2017-07-19 NOTE — Assessment & Plan Note (Signed)
Patient with chronic issues with constipation.  I suspect this is contributing to her discomfort as it improves following a bowel movement.  She has passed some odd material on a couple of occasions and a picture is above.  I will send this note to her GI physician to get their input on this.  The patient was advised if she did not hear from [place] regarding what the GI physician says by next week she is to contact us.

## 2017-07-19 NOTE — Assessment & Plan Note (Signed)
Improved control.  She sees endocrinology next week.

## 2017-07-19 NOTE — Assessment & Plan Note (Signed)
Well-controlled on Qvar.  We will refill this and if need to do a prior authorization.

## 2017-07-19 NOTE — Patient Instructions (Signed)
Nice to see you. We will check lab work today and contact you with the results. I sent your Qvar into the pharmacy. We will contact your GI doctor regarding your bowel movements and if you do not hear back from [place] by next week please let us know.

## 2017-07-19 NOTE — Assessment & Plan Note (Signed)
She saw GI.  It appears she was supposed to have a H. pylori stool test though has not completed this.  She will try to locate the order for this and if she cannot find it she will contact GI.

## 2017-07-19 NOTE — Assessment & Plan Note (Signed)
Well-controlled.  Continue current regimen.  She recently had lab work done through endocrinology.

## 2017-07-19 NOTE — Assessment & Plan Note (Signed)
Refer to ENT.  They can also evaluate her cerumen issues.

## 2017-07-19 NOTE — Progress Notes (Signed)
Tommi Rumps, MD Phone: [phone]  Felicia Roberts is a 77 y.o. female who presents today for f/u.  HYPERTENSION  Disease Monitoring  Home BP Monitoring not checking Chest pain- no    Dyspnea- no Medications  Compliance-  Taking amlodipine, lisinopril.   Edema- no  DIABETES Disease Monitoring: Blood Sugar ranges-90s Polyuria/phagia/dipsia- no      Optho- UTD Medications: Compliance- taking actos, metformin, glyburide Hypoglycemic symptoms- rare, drinks juice and improves  Patient notes on a couple of occasions over the last couple of months she has noted an odd material come out with her bowel movements.  There will be a picture below.  Appears to be white or yellowish and is oblong in the shape of a jellybean.  She states it seems to be fatty.  She notes no blood or diarrhea.  Typically has constipation when this happens.  Does not occur all the time.  Occasionally has a light lower abdominal discomfort though this resolves after having a bowel movement.  She does note occasional constipation.  No history of surgeries.  No burning with urination.  No vaginal discharge.  She reports issues with her ears bilaterally with some itching and cerumen impaction.  Does have chronic history of tinnitus.  She has followed with ENT in the past.  She has a history of asthma.  She notes no symptoms since going on the Qvar.     Social History   Tobacco Use  Smoking Status Never Smoker  Smokeless Tobacco Never Used     ROS see history of present illness  Objective  Physical Exam Vitals:   07/19/17 0950  BP: 124/70  Pulse: 70  Temp: (!) 97.5 F (36.4 C)  SpO2: 97%    BP Readings from Last 3 Encounters:  07/19/17 124/70  06/07/17 130/78  05/03/17 110/70   Wt Readings from Last 3 Encounters:  07/19/17 133 lb 9.6 oz (60.6 kg)  06/07/17 128 lb (58.1 kg)  05/03/17 129 lb (58.5 kg)    Physical Exam  Constitutional: No distress.  HENT:  Head: Normocephalic and  atraumatic.  Bilateral TMs obscured initially by cerumen, these were irrigated by the CMA revealing normal TMs  Cardiovascular: Normal rate, regular rhythm and normal heart sounds.  Pulmonary/Chest: Effort normal and breath sounds normal.  Abdominal: Soft. Bowel sounds are normal. She exhibits no distension. There is no rebound and no guarding.  Slight discomfort on palpation of lower abdomen  Musculoskeletal: She exhibits no edema.  Neurological: She is alert.  Skin: Skin is warm and dry. She is not diaphoretic.         Assessment/Plan: Please see individual problem list.  Essential hypertension Well-controlled.  Continue current regimen.  She recently had lab work done through endocrinology.  Asthma Well-controlled on Qvar.  We will refill this and if need to do a prior authorization.  DM (diabetes mellitus), type 2, uncontrolled, with renal complications (HCC) Improved control.  She sees endocrinology next week.  Constipation Patient with chronic issues with constipation.  I suspect this is contributing to her discomfort as it improves following a bowel movement.  She has passed some odd material on a couple of occasions and a picture is above.  I will send this note to her GI physician to get their input on this.  The patient was advised if she did not hear from [place] regarding what the GI physician says by next week she is to contact us.  Tinnitus of both ears Refer to ENT.  They  can also evaluate her cerumen issues.  Epigastric pain She saw GI.  It appears she was supposed to have a H. pylori stool test though has not completed this.  She will try to locate the order for this and if she cannot find it she will contact GI.   Health Maintenance: Mammogram ordered.  Orders Placed This Encounter  Procedures  . MM 3D SCREEN BREAST BILATERAL    Standing Status:   Future    Standing Expiration Date:   09/19/2018    Order Specific Question:   Reason for Exam (SYMPTOM  OR  DIAGNOSIS REQUIRED)    Answer:   screening    Order Specific Question:   Preferred imaging location?    Answer:   [facility]  . Ambulatory referral to ENT    Referral Priority:   Routine    Referral Type:   Consultation    Referral Reason:   Specialty Services Required    Requested Specialty:   Otolaryngology    Number of Visits Requested:   1    Meds ordered this encounter  Medications  . beclomethasone (QVAR) 80 MCG/ACT inhaler    Sig: Inhale 1 puff into the lungs 2 (two) times daily.    Dispense:  1 Inhaler    Refill:  [place], MD [place]

## 2017-07-21 ENCOUNTER — Encounter: Payer: Self-pay | Admitting: Family Medicine

## 2017-08-02 DIAGNOSIS — N183 Chronic kidney disease, stage 3 (moderate): Secondary | ICD-10-CM | POA: Diagnosis not present

## 2017-08-02 DIAGNOSIS — E1122 Type 2 diabetes mellitus with diabetic chronic kidney disease: Secondary | ICD-10-CM | POA: Diagnosis not present

## 2017-08-09 ENCOUNTER — Ambulatory Visit
Admission: RE | Admit: 2017-08-09 | Discharge: 2017-08-09 | Disposition: A | Discharge status: Home / Self Care | Payer: Medicare HMO | Source: Ambulatory Visit | Attending: Family Medicine | Admitting: Family Medicine

## 2017-08-09 DIAGNOSIS — N183 Chronic kidney disease, stage 3 (moderate): Secondary | ICD-10-CM | POA: Diagnosis not present

## 2017-08-09 DIAGNOSIS — Z1239 Encounter for other screening for malignant neoplasm of breast: Secondary | ICD-10-CM

## 2017-08-09 DIAGNOSIS — Z1231 Encounter for screening mammogram for malignant neoplasm of breast: Secondary | ICD-10-CM | POA: Insufficient documentation

## 2017-08-09 DIAGNOSIS — R195 Other fecal abnormalities: Secondary | ICD-10-CM | POA: Diagnosis not present

## 2017-08-09 DIAGNOSIS — E1122 Type 2 diabetes mellitus with diabetic chronic kidney disease: Secondary | ICD-10-CM | POA: Diagnosis not present

## 2017-08-09 DIAGNOSIS — E11649 Type 2 diabetes mellitus with hypoglycemia without coma: Secondary | ICD-10-CM | POA: Diagnosis not present

## 2017-08-09 DIAGNOSIS — R109 Unspecified abdominal pain: Secondary | ICD-10-CM | POA: Diagnosis not present

## 2017-08-15 DIAGNOSIS — R109 Unspecified abdominal pain: Secondary | ICD-10-CM | POA: Diagnosis not present

## 2017-08-15 DIAGNOSIS — R195 Other fecal abnormalities: Secondary | ICD-10-CM | POA: Diagnosis not present

## 2017-08-23 DIAGNOSIS — H6122 Impacted cerumen, left ear: Secondary | ICD-10-CM | POA: Diagnosis not present

## 2017-08-23 DIAGNOSIS — H903 Sensorineural hearing loss, bilateral: Secondary | ICD-10-CM | POA: Diagnosis not present

## 2017-08-23 DIAGNOSIS — H9319 Tinnitus, unspecified ear: Secondary | ICD-10-CM | POA: Diagnosis not present

## 2017-08-23 DIAGNOSIS — L299 Pruritus, unspecified: Secondary | ICD-10-CM | POA: Diagnosis not present

## 2017-09-03 ENCOUNTER — Other Ambulatory Visit: Payer: Self-pay | Admitting: Gastroenterology

## 2017-09-03 DIAGNOSIS — R110 Nausea: Secondary | ICD-10-CM | POA: Diagnosis not present

## 2017-09-03 DIAGNOSIS — R195 Other fecal abnormalities: Secondary | ICD-10-CM | POA: Diagnosis not present

## 2017-09-03 DIAGNOSIS — Z86010 Personal history of colonic polyps: Secondary | ICD-10-CM | POA: Diagnosis not present

## 2017-09-03 DIAGNOSIS — R1032 Left lower quadrant pain: Secondary | ICD-10-CM

## 2017-09-03 DIAGNOSIS — R1013 Epigastric pain: Secondary | ICD-10-CM | POA: Diagnosis not present

## 2017-09-04 ENCOUNTER — Other Ambulatory Visit: Payer: Self-pay

## 2017-09-04 DIAGNOSIS — N183 Chronic kidney disease, stage 3 (moderate): Principal | ICD-10-CM

## 2017-09-04 DIAGNOSIS — E1165 Type 2 diabetes mellitus with hyperglycemia: Principal | ICD-10-CM

## 2017-09-04 DIAGNOSIS — E1122 Type 2 diabetes mellitus with diabetic chronic kidney disease: Secondary | ICD-10-CM

## 2017-09-04 DIAGNOSIS — IMO0002 Reserved for concepts with insufficient information to code with codable children: Secondary | ICD-10-CM

## 2017-09-04 MED ORDER — ATORVASTATIN CALCIUM 40 MG PO TABS: 40.0000 mg | ORAL_TABLET | Freq: Every day | ORAL | 3 refills | Status: AC

## 2017-09-04 MED ORDER — ATORVASTATIN CALCIUM 40 MG PO TABS: 40.0000 mg | ORAL_TABLET | Freq: Every day | ORAL | 3 refills | Status: DC

## 2017-09-04 NOTE — Addendum Note (Signed)
Addended byElpidio Galea T on: 09/04/2017 09:44 AM   Modules accepted: Orders

## 2017-09-04 NOTE — Telephone Encounter (Signed)
Last OV 07/19/17 last filled by Dr.Cook 07/16/16 90 [id number]

## 2017-09-07 DIAGNOSIS — Z6825 Body mass index (BMI) 25.0-25.9, adult: Secondary | ICD-10-CM | POA: Diagnosis not present

## 2017-09-07 DIAGNOSIS — K5900 Constipation, unspecified: Secondary | ICD-10-CM | POA: Diagnosis not present

## 2017-09-07 DIAGNOSIS — I10 Essential (primary) hypertension: Secondary | ICD-10-CM | POA: Diagnosis not present

## 2017-09-07 DIAGNOSIS — E663 Overweight: Secondary | ICD-10-CM | POA: Diagnosis not present

## 2017-09-07 DIAGNOSIS — J4530 Mild persistent asthma, uncomplicated: Secondary | ICD-10-CM | POA: Diagnosis not present

## 2017-09-07 DIAGNOSIS — Z7951 Long term (current) use of inhaled steroids: Secondary | ICD-10-CM | POA: Diagnosis not present

## 2017-09-07 DIAGNOSIS — K219 Gastro-esophageal reflux disease without esophagitis: Secondary | ICD-10-CM | POA: Diagnosis not present

## 2017-09-07 DIAGNOSIS — R1032 Left lower quadrant pain: Secondary | ICD-10-CM | POA: Diagnosis not present

## 2017-09-07 DIAGNOSIS — E785 Hyperlipidemia, unspecified: Secondary | ICD-10-CM | POA: Diagnosis not present

## 2017-09-07 DIAGNOSIS — E119 Type 2 diabetes mellitus without complications: Secondary | ICD-10-CM | POA: Diagnosis not present

## 2017-09-09 ENCOUNTER — Ambulatory Visit
Admission: RE | Admit: 2017-09-09 | Discharge: 2017-09-09 | Disposition: A | Discharge status: Home / Self Care | Payer: Medicare HMO | Source: Ambulatory Visit | Attending: Gastroenterology | Admitting: Gastroenterology

## 2017-09-09 DIAGNOSIS — I700 Atherosclerosis of aorta: Secondary | ICD-10-CM | POA: Diagnosis not present

## 2017-09-09 DIAGNOSIS — R1032 Left lower quadrant pain: Secondary | ICD-10-CM | POA: Insufficient documentation

## 2017-09-09 DIAGNOSIS — R110 Nausea: Secondary | ICD-10-CM | POA: Insufficient documentation

## 2017-09-09 DIAGNOSIS — R109 Unspecified abdominal pain: Secondary | ICD-10-CM | POA: Diagnosis not present

## 2017-09-09 DIAGNOSIS — K5730 Diverticulosis of large intestine without perforation or abscess without bleeding: Secondary | ICD-10-CM | POA: Diagnosis not present

## 2017-09-09 MED ORDER — IOPAMIDOL (ISOVUE-300) INJECTION 61%
80.0000 mL | Freq: Once | INTRAVENOUS | Status: AC | PRN
  Administered 2017-09-09: 80 mL via INTRAVENOUS

## 2017-09-12 NOTE — Progress Notes (Signed)
Dr. Marjory Sneddon pt has been scheduled with Dr. Alice Reichert for 09/25/17 for EGD/Colonoscopy. Pt last office note by you (as far as I can tell) was February 01, 2017.

## 2017-09-20 DIAGNOSIS — R69 Illness, unspecified: Secondary | ICD-10-CM | POA: Diagnosis not present

## 2017-09-25 ENCOUNTER — Ambulatory Visit: Payer: Medicare HMO | Admitting: Certified Registered Nurse Anesthetist

## 2017-09-25 ENCOUNTER — Telehealth: Payer: Self-pay

## 2017-09-25 ENCOUNTER — Ambulatory Visit
Admission: RE | Admit: 2017-09-25 | Discharge: 2017-09-25 | Disposition: A | Discharge status: Home / Self Care | Payer: Medicare HMO | Source: Ambulatory Visit | Attending: Internal Medicine | Admitting: Internal Medicine

## 2017-09-25 ENCOUNTER — Encounter
Admission: RE | Disposition: A | Discharge status: Home / Self Care | Payer: Self-pay | Source: Ambulatory Visit | Attending: Internal Medicine

## 2017-09-25 ENCOUNTER — Encounter: Payer: Self-pay | Admitting: Anesthesiology

## 2017-09-25 DIAGNOSIS — R110 Nausea: Secondary | ICD-10-CM | POA: Diagnosis not present

## 2017-09-25 DIAGNOSIS — K640 First degree hemorrhoids: Secondary | ICD-10-CM | POA: Insufficient documentation

## 2017-09-25 DIAGNOSIS — R1013 Epigastric pain: Secondary | ICD-10-CM | POA: Diagnosis not present

## 2017-09-25 DIAGNOSIS — E785 Hyperlipidemia, unspecified: Secondary | ICD-10-CM | POA: Diagnosis not present

## 2017-09-25 DIAGNOSIS — K5790 Diverticulosis of intestine, part unspecified, without perforation or abscess without bleeding: Secondary | ICD-10-CM | POA: Diagnosis not present

## 2017-09-25 DIAGNOSIS — Z79899 Other long term (current) drug therapy: Secondary | ICD-10-CM | POA: Diagnosis not present

## 2017-09-25 DIAGNOSIS — I129 Hypertensive chronic kidney disease with stage 1 through stage 4 chronic kidney disease, or unspecified chronic kidney disease: Secondary | ICD-10-CM | POA: Diagnosis not present

## 2017-09-25 DIAGNOSIS — D125 Benign neoplasm of sigmoid colon: Secondary | ICD-10-CM | POA: Diagnosis not present

## 2017-09-25 DIAGNOSIS — J45909 Unspecified asthma, uncomplicated: Secondary | ICD-10-CM | POA: Diagnosis not present

## 2017-09-25 DIAGNOSIS — I10 Essential (primary) hypertension: Secondary | ICD-10-CM | POA: Insufficient documentation

## 2017-09-25 DIAGNOSIS — Z8719 Personal history of other diseases of the digestive system: Secondary | ICD-10-CM | POA: Diagnosis not present

## 2017-09-25 DIAGNOSIS — K228 Other specified diseases of esophagus: Secondary | ICD-10-CM | POA: Diagnosis not present

## 2017-09-25 DIAGNOSIS — E119 Type 2 diabetes mellitus without complications: Secondary | ICD-10-CM | POA: Insufficient documentation

## 2017-09-25 DIAGNOSIS — K2950 Unspecified chronic gastritis without bleeding: Secondary | ICD-10-CM | POA: Diagnosis not present

## 2017-09-25 DIAGNOSIS — R1032 Left lower quadrant pain: Secondary | ICD-10-CM | POA: Diagnosis not present

## 2017-09-25 DIAGNOSIS — Z1211 Encounter for screening for malignant neoplasm of colon: Secondary | ICD-10-CM | POA: Insufficient documentation

## 2017-09-25 DIAGNOSIS — Z7984 Long term (current) use of oral hypoglycemic drugs: Secondary | ICD-10-CM | POA: Insufficient documentation

## 2017-09-25 DIAGNOSIS — Z888 Allergy status to other drugs, medicaments and biological substances status: Secondary | ICD-10-CM | POA: Diagnosis not present

## 2017-09-25 DIAGNOSIS — K621 Rectal polyp: Secondary | ICD-10-CM | POA: Insufficient documentation

## 2017-09-25 DIAGNOSIS — D128 Benign neoplasm of rectum: Secondary | ICD-10-CM | POA: Diagnosis not present

## 2017-09-25 DIAGNOSIS — K210 Gastro-esophageal reflux disease with esophagitis: Secondary | ICD-10-CM | POA: Diagnosis not present

## 2017-09-25 DIAGNOSIS — K5730 Diverticulosis of large intestine without perforation or abscess without bleeding: Secondary | ICD-10-CM | POA: Diagnosis not present

## 2017-09-25 DIAGNOSIS — Z86010 Personal history of colonic polyps: Secondary | ICD-10-CM | POA: Diagnosis not present

## 2017-09-25 DIAGNOSIS — E1122 Type 2 diabetes mellitus with diabetic chronic kidney disease: Secondary | ICD-10-CM | POA: Diagnosis not present

## 2017-09-25 DIAGNOSIS — K635 Polyp of colon: Secondary | ICD-10-CM | POA: Diagnosis not present

## 2017-09-25 DIAGNOSIS — K648 Other hemorrhoids: Secondary | ICD-10-CM | POA: Diagnosis not present

## 2017-09-25 HISTORY — PX: COLONOSCOPY WITH PROPOFOL: SHX5780

## 2017-09-25 HISTORY — PX: ESOPHAGOGASTRODUODENOSCOPY (EGD) WITH PROPOFOL: SHX5813

## 2017-09-25 LAB — GLUCOSE, CAPILLARY: Glucose-Capillary: 191 mg/dL — ABNORMAL HIGH (ref 70–99)

## 2017-09-25 LAB — HM COLONOSCOPY

## 2017-09-25 SURGERY — ESOPHAGOGASTRODUODENOSCOPY (EGD) WITH PROPOFOL
Anesthesia: General

## 2017-09-25 MED ORDER — PROPOFOL 10 MG/ML IV BOLUS
INTRAVENOUS | Status: AC
  Filled 2017-09-25: qty 20

## 2017-09-25 MED ORDER — PROPOFOL 10 MG/ML IV BOLUS
INTRAVENOUS | Status: DC | PRN
  Administered 2017-09-25: 10 mg via INTRAVENOUS
  Administered 2017-09-25: 50 mg via INTRAVENOUS
  Administered 2017-09-25: 20 mg via INTRAVENOUS

## 2017-09-25 MED ORDER — PROPOFOL 500 MG/50ML IV EMUL
INTRAVENOUS | Status: DC | PRN
  Administered 2017-09-25: 125 ug/kg/min via INTRAVENOUS

## 2017-09-25 MED ORDER — LIDOCAINE HCL (CARDIAC) PF 100 MG/5ML IV SOSY
PREFILLED_SYRINGE | INTRAVENOUS | Status: DC | PRN
  Administered 2017-09-25: 50 mg via INTRAVENOUS

## 2017-09-25 MED ORDER — SODIUM CHLORIDE 0.9 % IV SOLN
INTRAVENOUS | Status: DC
  Administered 2017-09-25: 09:00:00 via INTRAVENOUS
  Administered 2017-09-25: 1000 mL via INTRAVENOUS

## 2017-09-25 MED ORDER — PHENYLEPHRINE HCL 10 MG/ML IJ SOLN
INTRAMUSCULAR | Status: DC | PRN
  Administered 2017-09-25: 200 ug via INTRAVENOUS

## 2017-09-25 MED ORDER — PROPOFOL 500 MG/50ML IV EMUL
INTRAVENOUS | Status: AC
  Filled 2017-09-25: qty 50

## 2017-09-25 NOTE — Anesthesia Preprocedure Evaluation (Signed)
Anesthesia Evaluation  Patient identified by MRN, date of birth, ID band Patient awake    Reviewed: Allergy & Precautions, H&P , NPO status , Patient's Chart, lab work & pertinent test results, reviewed documented beta blocker date and time   Airway Mallampati: II  TM Distance: >3 FB Neck ROM: full    Dental  (+) Dental Advidsory Given, Caps, Missing, Teeth Intact   Pulmonary neg shortness of breath, asthma , neg COPD, Recent URI , Resolved,           Cardiovascular Exercise Tolerance: Good hypertension, (-) angina(-) CAD, (-) Past MI, (-) Cardiac Stents and (-) CABG (-) dysrhythmias (-) Valvular Problems/Murmurs     Neuro/Psych negative neurological ROS  negative psych ROS   GI/Hepatic negative GI ROS, Neg liver ROS,   Endo/Other  negative endocrine ROSdiabetes  Renal/GU Renal disease  negative genitourinary   Musculoskeletal   Abdominal   Peds  Hematology negative hematology ROS (+)   Anesthesia Other Findings Past Medical History: No date: Asthma No date: Chicken pox No date: Colon polyps No date: Diabetes mellitus without complication (HCC) No date: Diverticulosis No date: Hyperlipidemia No date: Hypertension   Reproductive/Obstetrics negative OB ROS                             Anesthesia Physical Anesthesia Plan  ASA: III  Anesthesia Plan: General   Post-op Pain Management:    Induction: Intravenous  PONV Risk Score and Plan: 3 and Propofol infusion  Airway Management Planned: Nasal Cannula  Additional Equipment:   Intra-op Plan:   Post-operative Plan:   Informed Consent: I have reviewed the patients History and Physical, chart, labs and discussed the procedure including the risks, benefits and alternatives for the proposed anesthesia with the patient or authorized representative who has indicated his/her understanding and acceptance.   Dental Advisory Given  Plan  Discussed with: Anesthesiologist, CRNA and Surgeon  Anesthesia Plan Comments:         Anesthesia Quick Evaluation

## 2017-09-25 NOTE — Telephone Encounter (Signed)
Patients daughter was seen by provider today and stated her the patients pharmacy needs a prior auth for Qvar. Informed her this was completed in April 2019 and approved. She states she received a letter stating it had not been approved. I called [employer] and they states the Qvar was approved but it was a different kind of Qvar 30mcg Aer ADAP. Completed prior auth on phone with [facility] for correct Qvar 80 mcg Act. Reference/approval number: [id number] Approved from 03/24/17-03/24/2018 Patients daughter notified

## 2017-09-25 NOTE — H&P (Signed)
Outpatient short stay form Pre-procedure 09/25/2017 8:38 AM Hari Casaus K. Alice Reichert, M.D.  Primary Physician: Thersa Salt, D.O.  Reason for visit:  Epigastric pain, nausea, LLQ pain, Personal hx of colon polyps.  History of present illness: patient is a 77 y/o [place] female presenting for EGD and colonoscopy for evaluation of the above problems. Patient's last colonoscopy was in 2014 "polyps". No rectal bleeding.     Current Facility-Administered Medications:  .  0.9 %  sodium chloride infusion, , Intravenous, Continuous, [place], Benay Pike, MD, Last Rate: 20 mL/hr at 09/25/17 0745, 1,000 mL at 09/25/17 0745  Medications Prior to Admission  Medication Sig Dispense Refill Last Dose  . albuterol (PROAIR HFA) 108 (90 Base) MCG/ACT inhaler Inhale 1-2 puffs into the lungs every 6 (six) hours as needed for wheezing or shortness of breath. 18 g 6 Past Week at Unknown time  . albuterol (PROVENTIL) (2.5 MG/3ML) 0.083% nebulizer solution Take 3 mLs (2.5 mg total) by nebulization every 6 (six) hours as needed for wheezing or shortness of breath. 150 mL 1 Past Week at Unknown time  . amLODipine (NORVASC) 5 MG tablet TAKE 1 TABLET BY MOUTH ONCE DAILY 90 tablet 3 09/25/2017 at 0500  . atorvastatin (LIPITOR) 40 MG tablet Take 1 tablet (40 mg total) by mouth daily. 90 tablet 3 Past Week at Unknown time  . beclomethasone (QVAR) 80 MCG/ACT inhaler Inhale 1 puff into the lungs 2 (two) times daily. 1 Inhaler 12 Past Week at Unknown time  . glyBURIDE (DIABETA) 5 MG tablet Take 1 tablet (5 mg total) by mouth 2 (two) times daily. 180 tablet 3 Past Week at Unknown time  . lisinopril (PRINIVIL,ZESTRIL) 20 MG tablet TAKE 1 TABLET BY MOUTH ONCE DAILY 90 tablet 3 09/25/2017 at 0500  . pantoprazole (PROTONIX) 40 MG tablet Take 40 mg by mouth daily.   Past Week at Unknown time  . metFORMIN (GLUCOPHAGE-XR) 500 MG 24 hr tablet Take 2 tablets (1,000 mg total) by mouth daily with supper. 180 tablet 3 Taking  . pioglitazone (ACTOS) 15 MG  tablet Take 1 tablet (15 mg total) by mouth daily. 90 tablet 3 Taking  . polyethylene glycol powder (GLYCOLAX/MIRALAX) powder Take 17 g by mouth 2 (two) times daily as needed. 3350 g 1 Taking     Allergies  Allergen Reactions  . Januvia [Sitagliptin] Rash     Past Medical History:  Diagnosis Date  . Asthma   . Chicken pox   . Colon polyps   . Diabetes mellitus without complication ([place])   . Diverticulosis   . Hyperlipidemia   . Hypertension     Review of systems:  Otherwise negative.    Physical Exam  Gen: Alert, oriented. Appears stated age.  HEENT: [place]/AT. PERRLA. Lungs: CTA, no wheezes. CV: RR nl S1, S2. Abd: soft, benign, no masses. BS+ Ext: No edema. Pulses 2+    Planned procedures: Proceed with EGD and colonoscopy. The patient understands the nature of the planned procedure, indications, risks, alternatives and potential complications including but not limited to bleeding, infection, perforation, damage to internal organs and possible oversedation/side effects from anesthesia. The patient agrees and gives consent to proceed.  Please refer to procedure notes for findings, recommendations and patient disposition/instructions.     Fischer Halley K. Alice Reichert, M.D. Gastroenterology 09/25/2017  8:38 AM

## 2017-09-25 NOTE — Op Note (Signed)
[facility] Gastroenterology Patient Name: Felicia Roberts Procedure Date: 09/25/2017 8:47 AM MRN: [id number] Account #: [id number] Date of Birth: 1940-11-23 Admit Type: Outpatient Age: 77 Room: [facility] ENDO ROOM 3 Gender: Female Note Status: Finalized Procedure:            Upper GI endoscopy Indications:          Epigastric abdominal pain, Abdominal pain in the left                        lower quadrant, Nausea Providers:            Benay Pike. Alice Reichert MD, MD Referring MD:         Angela Adam. Caryl Bis (Referring MD) Medicines:            Propofol per Anesthesia Complications:        No immediate complications. Procedure:            Pre-Anesthesia Assessment:                       - The risks and benefits of the procedure and the                        sedation options and risks were discussed with the                        patient. All questions were answered and informed                        consent was obtained.                       - Patient identification and proposed procedure were                        verified prior to the procedure by the nurse. The                        procedure was verified in the procedure room.                       - ASA Grade Assessment: III - A patient with severe                        systemic disease.                       - After reviewing the risks and benefits, the patient                        was deemed in satisfactory condition to undergo the                        procedure.                       After obtaining informed consent, the endoscope was                        passed under direct vision. Throughout the procedure,  the patient's blood pressure, pulse, and oxygen                        saturations were monitored continuously. The Endoscope                        was introduced through the mouth, and advanced to the                        third part of duodenum. The upper GI endoscopy was                         accomplished without difficulty. The patient tolerated                        the procedure well. Findings:      The Z-line was irregular and was found at the gastroesophageal junction.       Mucosa was biopsied with a cold forceps for histology. One specimen       bottle was sent to pathology.      The entire examined stomach was normal. Biopsies were taken with a cold       forceps for Helicobacter pylori testing.      The examined duodenum was normal.      The exam was otherwise without abnormality. Impression:           - Z-line irregular, at the gastroesophageal junction.                        Biopsied.                       - Normal stomach. Biopsied.                       - Normal examined duodenum.                       - The examination was otherwise normal. Recommendation:       - Await pathology results.                       - Proceed with colonoscopy Procedure Code(s):    --- Professional ---                       [phone], Esophagogastroduodenoscopy, flexible, transoral;                        with biopsy, single or multiple Diagnosis Code(s):    --- Professional ---                       K22.8, Other specified diseases of esophagus                       R10.13, Epigastric pain                       R10.32, Left lower quadrant pain                       R11.0, Nausea CPT copyright 2017 American Medical Association. All rights reserved. The codes documented in this report are preliminary  and upon coder review may  be revised to meet current compliance requirements. Efrain Sella MD, MD 09/25/2017 8:59:13 AM This report has been signed electronically. Number of Addenda: 0 Note Initiated On: 09/25/2017 8:47 AM      [facility]

## 2017-09-25 NOTE — Anesthesia Post-op Follow-up Note (Signed)
Anesthesia QCDR form completed.        

## 2017-09-25 NOTE — Op Note (Signed)
[facility] Gastroenterology Patient Name: Felicia Roberts Procedure Date: 09/25/2017 8:46 AM MRN: [id number] Account #: [id number] Date of Birth: 02-Jan-1941 Admit Type: Outpatient Age: 77 Room: [facility] ENDO ROOM 3 Gender: Female Note Status: Finalized Procedure:            Colonoscopy Indications:          High risk colon cancer surveillance: Personal history                        of colonic polyps Providers:            Benay Pike. Alice Reichert MD, MD Referring MD:         Angela Adam. Caryl Bis (Referring MD) Medicines:            Propofol per Anesthesia Complications:        No immediate complications. Procedure:            Pre-Anesthesia Assessment:                       - The risks and benefits of the procedure and the                        sedation options and risks were discussed with the                        patient. All questions were answered and informed                        consent was obtained.                       - Patient identification and proposed procedure were                        verified prior to the procedure by the nurse. The                        procedure was verified in the procedure room.                       - ASA Grade Assessment: II - A patient with mild                        systemic disease.                       - After reviewing the risks and benefits, the patient                        was deemed in satisfactory condition to undergo the                        procedure.                       After obtaining informed consent, the colonoscope was                        passed under direct vision. Throughout the procedure,  the patient's blood pressure, pulse, and oxygen                        saturations were monitored continuously. The                        Colonoscope was introduced through the anus and                        advanced to the the cecum, identified by appendiceal   orifice and ileocecal valve. The colonoscopy was                        performed without difficulty. The patient tolerated the                        procedure well. The quality of the bowel preparation                        was good. The ileocecal valve, appendiceal orifice, and                        rectum were photographed. Findings:      The perianal and digital rectal examinations were normal. Pertinent       negatives include normal sphincter tone and no palpable rectal lesions.      Many small-mouthed diverticula were found in the sigmoid colon.      A 3 mm polyp was found in the sigmoid colon. The polyp was sessile. The       polyp was removed with a cold snare. Resection and retrieval were       complete.      A 3 mm polyp was found in the rectum. The polyp was sessile. The polyp       was removed with a jumbo cold forceps. Resection and retrieval were       complete.      Non-bleeding internal hemorrhoids were found during retroflexion. The       hemorrhoids were Grade I (internal hemorrhoids that do not prolapse).      The exam was otherwise without abnormality. Impression:           - Diverticulosis in the sigmoid colon.                       - One 3 mm polyp in the sigmoid colon, removed with a                        cold snare. Resected and retrieved.                       - One 3 mm polyp in the rectum, removed with a jumbo                        cold forceps. Resected and retrieved.                       - Non-bleeding internal hemorrhoids.                       - The examination was otherwise normal. Recommendation:       - Patient  has a contact number available for                        emergencies. The signs and symptoms of potential                        delayed complications were discussed with the patient.                        Return to normal activities tomorrow. Written discharge                        instructions were provided to the patient.                        - Await pathology results from EGD, also performed                        today.                       - Resume previous diet.                       - Continue present medications.                       - Repeat colonoscopy in 5 years for surveillance.                       - Return to GI clinic PRN.                       - The findings and recommendations were discussed with                        the patient and their family. Procedure Code(s):    --- Professional ---                       [id number], Colonoscopy, flexible; with removal of tumor(s),                        polyp(s), or other lesion(s) by snare technique                       45380, 64, Colonoscopy, flexible; with biopsy, single                        or multiple Diagnosis Code(s):    --- Professional ---                       K57.30, Diverticulosis of large intestine without                        perforation or abscess without bleeding                       K62.1, Rectal polyp                       D12.5, Benign neoplasm of sigmoid colon  K64.0, First degree hemorrhoids                       Z86.010, Personal history of colonic polyps CPT copyright 2017 American Medical Association. All rights reserved. The codes documented in this report are preliminary and upon coder review may  be revised to meet current compliance requirements. Efrain Sella MD, MD 09/25/2017 9:26:01 AM This report has been signed electronically. Number of Addenda: 0 Note Initiated On: 09/25/2017 8:46 AM Scope Withdrawal Time: 0 hours 17 minutes 45 seconds  Total Procedure Duration: 0 hours 21 minutes 54 seconds       [facility]

## 2017-09-25 NOTE — Transfer of Care (Signed)
Immediate Anesthesia Transfer of Care Note  Patient: Felicia Roberts  Procedure(s) Performed: ESOPHAGOGASTRODUODENOSCOPY (EGD) WITH PROPOFOL (N/A ) COLONOSCOPY WITH PROPOFOL (N/A )  Patient Location: PACU  Anesthesia Type:MAC  Level of Consciousness: awake, alert  and oriented  Airway & Oxygen Therapy: Patient Spontanous Breathing  Post-op Assessment: Report given to RN and Post -op Vital signs reviewed and stable  Post vital signs: stable  Last Vitals:  Vitals Value Taken Time  BP    Temp    Pulse    Resp    SpO2      Last Pain:  Vitals:   09/25/17 0726  TempSrc: Tympanic  PainSc: 0-No pain         Complications: No apparent anesthesia complications

## 2017-09-25 NOTE — Interval H&P Note (Signed)
History and Physical Interval Note:  09/25/2017 8:40 AM  Felicia Roberts  has presented today for surgery, with the diagnosis of LLQ PAIN, P/H COLONIC POLYPS, EPIGASTRIC PAIN, NAUSEA  The various methods of treatment have been discussed with the patient and family. After consideration of risks, benefits and other options for treatment, the patient has consented to  Procedure(s): ESOPHAGOGASTRODUODENOSCOPY (EGD) WITH PROPOFOL (N/A) COLONOSCOPY WITH PROPOFOL (N/A) as a surgical intervention .  The patient's history has been reviewed, patient examined, no change in status, stable for surgery.  I have reviewed the patient's chart and labs.  Questions were answered to the patient's satisfaction.     [place], [place]

## 2017-09-26 NOTE — Anesthesia Postprocedure Evaluation (Signed)
Anesthesia Post Note  Patient: Felicia Roberts  Procedure(s) Performed: ESOPHAGOGASTRODUODENOSCOPY (EGD) WITH PROPOFOL (N/A ) COLONOSCOPY WITH PROPOFOL (N/A )  Patient location during evaluation: Endoscopy Anesthesia Type: General Level of consciousness: awake and alert Pain management: pain level controlled Vital Signs Assessment: post-procedure vital signs reviewed and stable Respiratory status: spontaneous breathing, nonlabored ventilation, respiratory function stable and patient connected to nasal cannula oxygen Cardiovascular status: blood pressure returned to baseline and stable Postop Assessment: no apparent nausea or vomiting Anesthetic complications: no     Last Vitals:  Vitals:   09/25/17 0935 09/25/17 0945  BP: (!) 92/42 (!) 106/52  Pulse: 62 64  Resp: 19 14  Temp:    SpO2: 95% 98%    Last Pain:  Vitals:   09/25/17 0945  TempSrc:   PainSc: 0-No pain                 Martha Clan

## 2017-09-27 LAB — SURGICAL PATHOLOGY

## 2017-09-30 ENCOUNTER — Encounter: Payer: Self-pay | Admitting: Internal Medicine

## 2017-10-03 ENCOUNTER — Encounter: Payer: Self-pay | Admitting: Family Medicine

## 2017-10-10 DIAGNOSIS — R1032 Left lower quadrant pain: Secondary | ICD-10-CM | POA: Diagnosis not present

## 2017-10-10 DIAGNOSIS — R300 Dysuria: Secondary | ICD-10-CM | POA: Diagnosis not present

## 2017-10-11 DIAGNOSIS — R1032 Left lower quadrant pain: Secondary | ICD-10-CM | POA: Diagnosis not present

## 2017-10-11 DIAGNOSIS — R300 Dysuria: Secondary | ICD-10-CM | POA: Diagnosis not present

## 2017-10-18 ENCOUNTER — Encounter: Payer: Self-pay | Admitting: Family Medicine

## 2017-10-18 ENCOUNTER — Ambulatory Visit (INDEPENDENT_AMBULATORY_CARE_PROVIDER_SITE_OTHER): Payer: Medicare HMO | Admitting: Family Medicine

## 2017-10-18 VITALS — BP 132/84 | HR 86 | Temp 98.0°F | Ht 59.0 in | Wt 132.1 lb

## 2017-10-18 DIAGNOSIS — H6123 Impacted cerumen, bilateral: Secondary | ICD-10-CM | POA: Diagnosis not present

## 2017-10-18 DIAGNOSIS — E1165 Type 2 diabetes mellitus with hyperglycemia: Secondary | ICD-10-CM | POA: Diagnosis not present

## 2017-10-18 DIAGNOSIS — R0789 Other chest pain: Secondary | ICD-10-CM

## 2017-10-18 DIAGNOSIS — E1129 Type 2 diabetes mellitus with other diabetic kidney complication: Secondary | ICD-10-CM | POA: Diagnosis not present

## 2017-10-18 DIAGNOSIS — I10 Essential (primary) hypertension: Secondary | ICD-10-CM

## 2017-10-18 DIAGNOSIS — IMO0002 Reserved for concepts with insufficient information to code with codable children: Secondary | ICD-10-CM

## 2017-10-18 NOTE — Assessment & Plan Note (Addendum)
Patient with typical and atypical features.  Symptoms could be cardiac related and be an atypical presentation.  Could be reflux related.  Could be muscular.  Unlikely VTE given stable vital signs and lack of risk factors.  She has been asymptomatic over the last 2 days.  We will refer to cardiology for evaluation given her risk factors and family history.  She is given return precautions.

## 2017-10-18 NOTE — Progress Notes (Signed)
Tommi Rumps, MD Phone: [phone]  Felicia Roberts is a 77 y.o. female who presents today for f/u.  CC: dm, htn, cerumen build up, chest pain  DIABETES Disease Monitoring: Blood Sugar ranges-85-160 Polyuria/phagia/dipsia- no      Optho- due, patient will schedule exam Medications: Compliance- taking glyburide, metfomrin, actos Hypoglycemic symptoms- no  HYPERTENSION  Disease Monitoring  Home BP Monitoring 130/80 Chest pain- see belowDyspnea- see below Medications  Compliance-  Taking amlodipine, lisinopril.   Edema- no  Chest pain: Patient notes 2 nights ago she woke up with a centralized chest discomfort around 3 AM.  Notes it was sharp in the middle.  Did not feel like reflux.  Did radiate down her right arm with some tingling.  She did have some shortness of breath.  Exertion made it better.  She went back to sleep and it was gone in the morning.  It has not recurred.  She notes no hemoptysis, leg swelling, or history of blood clot.  She does have a strong family history of MI in multiple siblings.  No recent surgeries.  No travel.  Asymptomatic over the last 2 days.  Cerumen buildup: Patient notes recurrent issues with this.  She had been using a steroid eardrop to stop the wax.  She did see ENT and they were able to get wax out of her years.  Social History   Tobacco Use  Smoking Status Never Smoker  Smokeless Tobacco Never Used     ROS see history of present illness  Objective  Physical Exam Vitals:   10/18/17 1026  BP: 132/84  Pulse: 86  Temp: 98 F (36.7 C)  SpO2: 97%    BP Readings from Last 3 Encounters:  10/18/17 132/84  09/25/17 (!) 106/52  07/19/17 124/70   Wt Readings from Last 3 Encounters:  10/18/17 132 lb 1.9 oz (59.9 kg)  09/25/17 130 lb (59 kg)  07/19/17 133 lb 9.6 oz (60.6 kg)    Physical Exam  Constitutional: No distress.  Cardiovascular: Normal rate, regular rhythm and normal heart sounds.  Pulmonary/Chest: Effort normal and  breath sounds normal. She exhibits no tenderness.  Musculoskeletal: She exhibits no edema.  Neurological: She is alert.  Skin: Skin is warm and dry. She is not diaphoretic.   EKG: Normal sinus rhythm, rate 72, T wave flattening in lead I inverted T wave in aVL, inversion in aVL appears similar to prior  Assessment/Plan: Please see individual problem list.  Essential hypertension Well-controlled for age.  She had recent lab work through endocrinology.  Continue current regimen.  DM (diabetes mellitus), type 2, uncontrolled, with renal complications (HCC) Improving control.  She had a recent A1c through endocrinology.  She will continue to follow with them.  Atypical chest pain Patient with typical and atypical features.  Symptoms could be cardiac related and be an atypical presentation.  Could be reflux related.  Could be muscular.  Unlikely VTE given stable vital signs and lack of risk factors.  She has been asymptomatic over the last 2 days.  We will refer to cardiology for evaluation given her risk factors and family history.  She is given return precautions.  Excessive cerumen in both ear canals None at this time.  Discussed Debrox for cerumen buildup.   Orders Placed This Encounter  Procedures  . Ambulatory referral to Cardiology    Referral Priority:   Routine    Referral Type:   Consultation    Referral Reason:   Specialty Services Required  Requested Specialty:   Cardiology    Number of Visits Requested:   1  . EKG 12-Lead    No orders of the defined types were placed in this encounter.    Tommi Rumps, MD [place]

## 2017-10-18 NOTE — Assessment & Plan Note (Signed)
Well-controlled for age.  She had recent lab work through endocrinology.  Continue current regimen.

## 2017-10-18 NOTE — Assessment & Plan Note (Signed)
None at this time.  Discussed Debrox for cerumen buildup.

## 2017-10-18 NOTE — Assessment & Plan Note (Signed)
Improving control.  She had a recent A1c through endocrinology.  She will continue to follow with them.

## 2017-10-18 NOTE — Patient Instructions (Signed)
Nice to see you. We will have you see cardiology given your recent episode of chest discomfort. You can use Debrox over-the-counter for your earwax. If you develop recurrent chest pain, shortness of breath, or any new or changing symptoms please seek medical attention immediately.

## 2017-10-21 DIAGNOSIS — R69 Illness, unspecified: Secondary | ICD-10-CM | POA: Diagnosis not present

## 2017-11-07 DIAGNOSIS — K5909 Other constipation: Secondary | ICD-10-CM | POA: Diagnosis not present

## 2017-11-07 DIAGNOSIS — R1032 Left lower quadrant pain: Secondary | ICD-10-CM | POA: Diagnosis not present

## 2017-11-07 DIAGNOSIS — R300 Dysuria: Secondary | ICD-10-CM | POA: Diagnosis not present

## 2017-11-08 DIAGNOSIS — E11649 Type 2 diabetes mellitus with hypoglycemia without coma: Secondary | ICD-10-CM | POA: Diagnosis not present

## 2017-11-15 DIAGNOSIS — E11649 Type 2 diabetes mellitus with hypoglycemia without coma: Secondary | ICD-10-CM | POA: Diagnosis not present

## 2017-11-15 DIAGNOSIS — E1122 Type 2 diabetes mellitus with diabetic chronic kidney disease: Secondary | ICD-10-CM | POA: Diagnosis not present

## 2017-11-15 DIAGNOSIS — N183 Chronic kidney disease, stage 3 (moderate): Secondary | ICD-10-CM | POA: Diagnosis not present

## 2017-11-22 DIAGNOSIS — R69 Illness, unspecified: Secondary | ICD-10-CM | POA: Diagnosis not present

## 2017-11-29 ENCOUNTER — Encounter: Payer: Self-pay | Admitting: Emergency Medicine

## 2017-11-29 ENCOUNTER — Emergency Department
Admission: EM | Admit: 2017-11-29 | Discharge: 2017-11-29 | Disposition: A | Discharge status: Home / Self Care | Payer: Medicare HMO | Attending: Emergency Medicine | Admitting: Emergency Medicine

## 2017-11-29 ENCOUNTER — Emergency Department: Payer: Medicare HMO

## 2017-11-29 ENCOUNTER — Other Ambulatory Visit: Payer: Self-pay

## 2017-11-29 DIAGNOSIS — N183 Chronic kidney disease, stage 3 (moderate): Secondary | ICD-10-CM | POA: Insufficient documentation

## 2017-11-29 DIAGNOSIS — M1811 Unilateral primary osteoarthritis of first carpometacarpal joint, right hand: Secondary | ICD-10-CM | POA: Insufficient documentation

## 2017-11-29 DIAGNOSIS — J45909 Unspecified asthma, uncomplicated: Secondary | ICD-10-CM | POA: Insufficient documentation

## 2017-11-29 DIAGNOSIS — E1122 Type 2 diabetes mellitus with diabetic chronic kidney disease: Secondary | ICD-10-CM | POA: Diagnosis not present

## 2017-11-29 DIAGNOSIS — Z7984 Long term (current) use of oral hypoglycemic drugs: Secondary | ICD-10-CM | POA: Diagnosis not present

## 2017-11-29 DIAGNOSIS — M79641 Pain in right hand: Secondary | ICD-10-CM | POA: Diagnosis not present

## 2017-11-29 DIAGNOSIS — Z79899 Other long term (current) drug therapy: Secondary | ICD-10-CM | POA: Diagnosis not present

## 2017-11-29 DIAGNOSIS — I129 Hypertensive chronic kidney disease with stage 1 through stage 4 chronic kidney disease, or unspecified chronic kidney disease: Secondary | ICD-10-CM | POA: Insufficient documentation

## 2017-11-29 MED ORDER — MELOXICAM 15 MG PO TABS: 15.0000 mg | ORAL_TABLET | Freq: Every day | ORAL | 0 refills | Status: AC

## 2017-11-29 NOTE — ED Notes (Signed)
See triage note  Presents with pain to right hand  States pain is mainly to right thumb into wrist  No deformity  Denies any injury  Good pulses grip is weaker on the right

## 2017-11-29 NOTE — Discharge Instructions (Addendum)
Please wear thumb brace for 1 week, you may remove brace to shower.  Take meloxicam 1 tablet daily with food for 10 days.  Do not take Aleve or ibuprofen.  You may use Tylenol for additional pain relief.  Follow-up with your primary care provider or orthopedics in 1 week if no relief.

## 2017-11-29 NOTE — ED Provider Notes (Signed)
[facility] EMERGENCY DEPARTMENT Provider Note   CSN: [id number] Arrival date & time: 11/29/17  1119     History   Chief Complaint Chief Complaint  Patient presents with  . Hand Pain    HPI Felicia Roberts is a 77 y.o. female presents to the emergency department evaluation right thumb pain.  She denies any trauma or injury.  States she woke up with her hand underneath her body and noticed pain along the base of the thumb joint.  No catching triggering locking but she notices pain and swelling.  No numbness or tingling.  She is able to make a fist.  No history of thumb pain or hand pain in the past.  She has not had any medication pain.  Pain is moderate.  She gets relief with not moving it but has pain with grasping and gripping  HPI  Past Medical History:  Diagnosis Date  . Asthma   . Chicken pox   . Colon polyps   . Diabetes mellitus without complication ([place])   . Diverticulosis   . Hyperlipidemia   . Hypertension     Patient Active Problem List   Diagnosis Date Noted  . Atypical chest pain 10/18/2017  . Excessive cerumen in both ear canals 10/18/2017  . Tinnitus of both ears 07/19/2017  . History of colon polyps 01/18/2017  . Epigastric pain 04/06/2016  . CKD (chronic kidney disease) stage 3, GFR 30-59 ml/min (HCC) 06/20/2015  . Constipation 06/20/2015  . Essential hypertension 06/19/2015  . Hyperlipidemia 04/18/2015  . DM (diabetes mellitus), type 2, uncontrolled, with renal complications ([place]) 65/78/4696  . Asthma 04/18/2015    Past Surgical History:  Procedure Laterality Date  . BLADDER SURGERY    . COLONOSCOPY WITH PROPOFOL N/A 09/25/2017   Procedure: COLONOSCOPY WITH PROPOFOL;  Surgeon: Toledo, Benay Pike, MD;  Location: [facility] ENDOSCOPY;  Service: Gastroenterology;  Laterality: N/A;  . ESOPHAGOGASTRODUODENOSCOPY (EGD) WITH PROPOFOL N/A 09/25/2017   Procedure: ESOPHAGOGASTRODUODENOSCOPY (EGD) WITH PROPOFOL;  Surgeon: Toledo, Benay Pike, MD;   Location: [facility] ENDOSCOPY;  Service: Gastroenterology;  Laterality: N/A;     OB History    Gravida  9   Para  9   Term      Preterm      AB      Living  9     SAB      TAB      Ectopic      Multiple      Live Births           Obstetric Comments  1st Menstrual Cycle: 13 1st Pregnancy:  20         Home Medications    Prior to Admission medications   Medication Sig Start Date End Date Taking? Authorizing Provider  albuterol (PROAIR HFA) 108 (90 Base) MCG/ACT inhaler Inhale 1-2 puffs into the lungs every 6 (six) hours as needed for wheezing or shortness of breath. 01/04/16   Thersa Salt G, DO  albuterol (PROVENTIL) (2.5 MG/3ML) 0.083% nebulizer solution Take 3 mLs (2.5 mg total) by nebulization every 6 (six) hours as needed for wheezing or shortness of breath. 01/04/16   Thersa Salt G, DO  amLODipine (NORVASC) 5 MG tablet TAKE 1 TABLET BY MOUTH ONCE DAILY 05/03/17   Leone Haven, MD  atorvastatin (LIPITOR) 40 MG tablet Take 1 tablet (40 mg total) by mouth daily. 09/04/17   Leone Haven, MD  beclomethasone (QVAR) 80 MCG/ACT inhaler Inhale 1 puff into the lungs  2 (two) times daily. 07/19/17   Leone Haven, MD  Fluocinolone Acetonide 0.01 % OIL Place in ear(s).    [provider]  glyBURIDE (DIABETA) 5 MG tablet Take 1 tablet (5 mg total) by mouth 2 (two) times daily. 04/26/15   Mar Daring, PA-C  lisinopril (PRINIVIL,ZESTRIL) 20 MG tablet TAKE 1 TABLET BY MOUTH ONCE DAILY 05/03/17   Leone Haven, MD  meloxicam (MOBIC) 15 MG tablet Take 1 tablet (15 mg total) by mouth daily. 11/29/17 11/29/18  Duanne Guess, PA-C  metFORMIN (GLUCOPHAGE-XR) 500 MG 24 hr tablet Take 2 tablets (1,000 mg total) by mouth daily with supper. 04/06/16 10/18/17  Coral Spikes, DO  nortriptyline (PAMELOR) 25 MG capsule Take 25 mg by mouth. 10/10/17 10/10/18  [provider]  pantoprazole (PROTONIX) 40 MG tablet Take 40 mg by mouth daily.    [provider]  pioglitazone (ACTOS) 15 MG tablet Take 1 tablet (15 mg total) by mouth daily. 04/06/16 10/18/17  Coral Spikes, DO  polyethylene glycol powder (GLYCOLAX/MIRALAX) powder Take 17 g by mouth 2 (two) times daily as needed. 06/17/15   Coral Spikes, DO    Family History Family History  Problem Relation Age of Onset  . Cancer Father        prostate  . Diabetes Sister   . Hypertension Sister   . AAA (abdominal aortic aneurysm) Brother   . Diabetes Sister   . Hypertension Sister   . Diabetes Sister   . Hypertension Sister   . Diabetes Sister   . Hypertension Sister   . Hypertension Mother   . Heart disease Mother   . Diabetes Mother   . Breast cancer Neg Hx     Social History Social History   Tobacco Use  . Smoking status: Never Smoker  . Smokeless tobacco: Never Used  Substance Use Topics  . Alcohol use: No  . Drug use: No     Allergies   Januvia [sitagliptin]   Review of Systems Review of Systems  Constitutional: Negative for fever.  Musculoskeletal: Positive for arthralgias and joint swelling. Negative for myalgias, neck pain and neck stiffness.  Skin: Negative for rash and wound.  Neurological: Negative for numbness and headaches.     Physical Exam Updated Vital Signs BP 113/82 (BP Location: Left Arm)   Pulse 100   Temp 98 F (36.7 C) (Oral)   Resp 20   Ht 5\' 1"  (1.549 m)   SpO2 97%   BMI 24.96 kg/m   Physical Exam  Constitutional: She is oriented to person, place, and time. She appears well-developed and well-nourished.  HENT:  Head: Normocephalic and atraumatic.  Eyes: Conjunctivae are normal.  Neck: Normal range of motion.  Cardiovascular: Normal rate.  Pulmonary/Chest: Effort normal. No respiratory distress.  Musculoskeletal: Normal range of motion.  Right hand shows patient is full composite fist.  Positive CMC grind test with tenderness along the base of the thumb at the first [facility] joint.  No catching triggering locking.  She has no  ulnar collateral or radial collateral ligament stability of the thumb.  She is neurovascular intact with 2+ cap refill and 2+ radial pulse.  No significant swelling warmth erythema noted throughout the hand or digits.  Neurological: She is alert and oriented to person, place, and time.  Skin: Skin is warm. No rash noted.  Psychiatric: She has a normal mood and affect. Her behavior is normal. Thought content normal.     ED  Treatments / Results  Labs (all labs ordered are listed, but only abnormal results are displayed) Labs Reviewed - No data to display  EKG None  Radiology Dg Hand Complete Right  Result Date: 11/29/2017 CLINICAL DATA:  Patient awakened with pain in the hand especially in the thumb having had the hand underneath her body while sleeping. Patient reports being unable to grasp objects with the right hand. EXAM: RIGHT HAND - COMPLETE 3+ VIEW COMPARISON:  None. FINDINGS: The bones are subjectively adequately mineralized. The phalanges and metacarpals are intact. The joint spaces are well maintained with exception of the first [facility] joint where there is minimal narrowing. There are arterial calcifications present in the wrist. IMPRESSION: There is no acute or significant chronic bony abnormality of the right hand. Arterial calcifications are present consistent with the patient's known diabetes. Electronically Signed   By: David  Martinique M.D.   On: 11/29/2017 12:33    Procedures Procedures (including critical care time)  Medications Ordered in ED Medications - No data to display   Initial Impression / Assessment and Plan / ED Course  I have reviewed the triage vital signs and the nursing notes.  Pertinent labs & imaging results that were available during my care of the patient were reviewed by me and considered in my medical decision making (see chart for details).     77 year old female with right hand and thumb pain.  Exam and x-rays that were ordered and reviewed by me  today show signs of CMC osteoarthritis.  She is placed into a thumb spica splint.  She will start meloxicam daily for 10 days.  She will follow-up with orthopedics or PCP if no improvement today's.  She understands signs symptoms return to the ED for.  Final Clinical Impressions(s) / ED Diagnoses   Final diagnoses:  Right hand pain  Primary osteoarthritis of first carpometacarpal joint of right hand    ED Discharge Orders         Ordered    meloxicam (MOBIC) 15 MG tablet  Daily     11/29/17 1251           Duanne Guess, PA-C 11/29/17 1255    Delman Kitten, MD 12/01/17 1545

## 2017-11-29 NOTE — ED Triage Notes (Signed)
Pt reports that she slept with her arm under her body last night, when she goes to grasp or move her hand it hurts in from her thumb to her wrist. Hand is slightly swollen.

## 2017-12-05 ENCOUNTER — Encounter: Payer: Self-pay | Admitting: Urology

## 2017-12-05 ENCOUNTER — Ambulatory Visit (INDEPENDENT_AMBULATORY_CARE_PROVIDER_SITE_OTHER): Payer: Medicare HMO | Admitting: Urology

## 2017-12-05 VITALS — BP 145/84 | HR 67 | Resp 14 | Ht 59.0 in | Wt 129.8 lb

## 2017-12-05 DIAGNOSIS — N361 Urethral diverticulum: Secondary | ICD-10-CM | POA: Diagnosis not present

## 2017-12-05 DIAGNOSIS — N952 Postmenopausal atrophic vaginitis: Secondary | ICD-10-CM | POA: Diagnosis not present

## 2017-12-05 DIAGNOSIS — R300 Dysuria: Secondary | ICD-10-CM | POA: Diagnosis not present

## 2017-12-05 LAB — URINALYSIS, COMPLETE
Bilirubin, UA: NEGATIVE
Glucose, UA: NEGATIVE
KETONES UA: NEGATIVE
Leukocytes, UA: NEGATIVE
Nitrite, UA: NEGATIVE
RBC UA: NEGATIVE
SPEC GRAV UA: 1.025 (ref 1.005–1.030)
Urobilinogen, Ur: 0.2 mg/dL (ref 0.2–1.0)
pH, UA: 5.5 (ref 5.0–7.5)

## 2017-12-05 LAB — MICROSCOPIC EXAMINATION: RBC MICROSCOPIC, UA: NONE SEEN /HPF (ref 0–2)

## 2017-12-05 LAB — BLADDER SCAN AMB NON-IMAGING: Scan Result: 34

## 2017-12-05 MED ORDER — ESTRADIOL 0.1 MG/GM VA CREA: TOPICAL_CREAM | VAGINAL | 12 refills | Status: DC

## 2017-12-05 MED ORDER — ESTROGENS, CONJUGATED 0.625 MG/GM VA CREA: TOPICAL_CREAM | VAGINAL | 12 refills | Status: DC

## 2017-12-05 NOTE — Patient Instructions (Signed)
I have given you two prescriptions for a vaginal estrogen cream.  Estrace and Premarin.  Please take these to your pharmacy and see which one your insurance covers.  If both are too expensive, please call the office at [phone] for an alternative.  You are given a sample of vaginal estrogen cream Premarin and instructed to apply 0.5mg (pea-sized amount)  just inside the vaginal introitus with a finger-tip on Monday, Wednesday and Friday nights,     

## 2017-12-05 NOTE — Progress Notes (Signed)
12/05/2017 10:35 AM   Felicia Roberts 1940/04/01 [id number]  Referring provider: Leone Haven, MD [address] [place], [place] [address]  Chief Complaint  Patient presents with  . New Patient (Initial Visit)    dysuria    HPI: Patient is a 77 year old Hispanic female who presents today as a referral from [place], [place] with a complaints of vaginal dryness and burning in the suprapubic and vaginal area.   Patient states that she has had the sensation of burning in the suprapubic region and vaginal area for the last several weeks.  She has a history of a urethral diverticulum that was surgically treated in 1984.  She states the symptoms are somewhat similar and she is fearful as she went into urinary retention with the urethral diverticulum.  She states she has not been having recurrent urinary tract infections.  She denies any urinary incontinence.  Patient denies any gross hematuria, dysuria or suprapubic/flank pain.  Patient denies any fevers, chills, nausea or vomiting.   She also states that her vagina is dry.   Her UA today is positive for moderate bacteria   Her PVR is  34 mL.  Previous urine culture was negative at [facility] office.    Contrast CT in 08/2017 by GI for left lower quadrant pain and nausea revealed negative adrenals. No hydronephrosis or stone. Unremarkable bladder.    PMH: Past Medical History:  Diagnosis Date  . Asthma   . Chicken pox   . Colon polyps   . Diabetes mellitus without complication ([place])   . Diverticulosis   . Hyperlipidemia   . Hypertension     Surgical History: Past Surgical History:  Procedure Laterality Date  . BLADDER SURGERY    . COLONOSCOPY WITH PROPOFOL N/A 09/25/2017   Procedure: COLONOSCOPY WITH PROPOFOL;  Surgeon: Toledo, Benay Pike, MD;  Location: [facility] ENDOSCOPY;  Service: Gastroenterology;  Laterality: N/A;  . ESOPHAGOGASTRODUODENOSCOPY (EGD) WITH PROPOFOL N/A 09/25/2017   Procedure:  ESOPHAGOGASTRODUODENOSCOPY (EGD) WITH PROPOFOL;  Surgeon: Toledo, Benay Pike, MD;  Location: [facility] ENDOSCOPY;  Service: Gastroenterology;  Laterality: N/A;  . URETHRAL DIVERTICULUM REPAIR  1980    Home Medications:  Allergies as of 12/05/2017      Reactions   Januvia [sitagliptin] Rash      Medication List        Accurate as of 12/05/17 10:35 AM. Always use your most recent med list.          albuterol (2.5 MG/3ML) 0.083% nebulizer solution Commonly known as:  PROVENTIL Take 3 mLs (2.5 mg total) by nebulization every 6 (six) hours as needed for wheezing or shortness of breath.   albuterol 108 (90 Base) MCG/ACT inhaler Commonly known as:  PROVENTIL HFA;VENTOLIN HFA Inhale 1-2 puffs into the lungs every 6 (six) hours as needed for wheezing or shortness of breath.   amLODipine 5 MG tablet Commonly known as:  NORVASC TAKE 1 TABLET BY MOUTH ONCE DAILY   atorvastatin 40 MG tablet Commonly known as:  LIPITOR Take 1 tablet (40 mg total) by mouth daily.   beclomethasone 80 MCG/ACT inhaler Commonly known as:  QVAR Inhale 1 puff into the lungs 2 (two) times daily.   glyBURIDE 5 MG tablet Commonly known as:  DIABETA Take 1 tablet (5 mg total) by mouth 2 (two) times daily.   lisinopril 20 MG tablet Commonly known as:  PRINIVIL,ZESTRIL TAKE 1 TABLET BY MOUTH ONCE DAILY   meloxicam 15 MG tablet Commonly known as:  MOBIC  Take 1 tablet (15 mg total) by mouth daily.   metFORMIN 500 MG 24 hr tablet Commonly known as:  GLUCOPHAGE-XR Take 1,000 mg by mouth daily after supper.   nortriptyline 25 MG capsule Commonly known as:  PAMELOR Take 25 mg by mouth.   pioglitazone 15 MG tablet Commonly known as:  ACTOS Take 15 mg by mouth daily.   Semaglutide(0.25 or 0.5MG /DOS) 2 MG/1.5ML Sopn Inject into the skin.       Allergies:  Allergies  Allergen Reactions  . Januvia [Sitagliptin] Rash    Family History: Family History  Problem Relation Age of Onset  . Cancer Father         prostate  . Diabetes Sister   . Hypertension Sister   . AAA (abdominal aortic aneurysm) Brother   . Diabetes Sister   . Hypertension Sister   . Diabetes Sister   . Hypertension Sister   . Diabetes Sister   . Hypertension Sister   . Hypertension Mother   . Heart disease Mother   . Diabetes Mother   . Breast cancer Neg Hx     Social History:  reports that she has never smoked. She has never used smokeless tobacco. She reports that she does not drink alcohol or use drugs.  ROS: UROLOGY Frequent Urination?: No Hard to postpone urination?: No Burning/pain with urination?: No Get up at night to urinate?: No Leakage of urine?: Yes Urine stream starts and stops?: No Trouble starting stream?: No Do you have to strain to urinate?: No Blood in urine?: No Urinary tract infection?: No Sexually transmitted disease?: No Injury to kidneys or bladder?: No Painful intercourse?: No Weak stream?: No Currently pregnant?: No Vaginal bleeding?: No Last menstrual period?: n  Gastrointestinal Nausea?: No Vomiting?: No Indigestion/heartburn?: No Diarrhea?: No Constipation?: Yes  Constitutional Fever: No Night sweats?: No Weight loss?: No Fatigue?: No  Skin Skin rash/lesions?: No Itching?: No  Eyes Blurred vision?: No Double vision?: Yes  Ears/Nose/Throat Sore throat?: No Sinus problems?: No  Hematologic/Lymphatic Swollen glands?: No Easy bruising?: No  Cardiovascular Leg swelling?: No Chest pain?: No  Respiratory Cough?: No Shortness of breath?: No  Endocrine Excessive thirst?: No  Musculoskeletal Back pain?: No Joint pain?: No  Neurological Headaches?: No Dizziness?: No  Psychologic Depression?: No Anxiety?: No  Physical Exam: Resp 14   Ht 4\' 11"  (1.499 m)   Wt 129 lb 12.8 oz (58.9 kg)   BMI 26.22 kg/m   Constitutional:  Well nourished. Alert and oriented, No acute distress. HEENT: [place] AT, moist mucus membranes.  Trachea midline, no  masses. Cardiovascular: No clubbing, cyanosis, or edema. Respiratory: Normal respiratory effort, no increased work of breathing. GI: Abdomen is soft, non tender, non distended, no abdominal masses. Liver and spleen not palpable.  No hernias appreciated.  Stool sample for occult testing is not indicated.   GU: No CVA tenderness.  No bladder fullness or masses.  Atrophic external genitalia, normal pubic hair distribution, no lesions.  Small urethra caruncle noted.  Urethra was tender to palpation and indurated.  No discharge from the urethra with palpation.  Bladder was tender to palpation.  Pale vagina mucosa, poor estrogen effect, no discharge, no lesions, poor pelvic support, grade I cystocele and no rectocele noted.  No cervical motion tenderness.  Uterus is freely mobile and non-fixed.  No adnexal/parametria masses or tenderness noted.  Anus and perineum are without rashes or lesions.    Skin: No rashes, bruises or suspicious lesions. Lymph: No cervical or inguinal adenopathy.  Neurologic: Grossly intact, no focal deficits, moving all 4 extremities. Psychiatric: Normal mood and affect.  Laboratory Data: Lab Results  Component Value Date   WBC 8.1 04/06/2016   HGB 14.2 04/06/2016   HCT 42.6 04/06/2016   MCV 91.7 04/06/2016   PLT 387.0 04/06/2016    Lab Results  Component Value Date   CREATININE 1.09 04/06/2016    No results found for: PSA  No results found for: TESTOSTERONE  Lab Results  Component Value Date   HGBA1C 9.5 (H) 04/06/2016    Lab Results  Component Value Date   TSH 2.340 04/19/2015       Component Value Date/Time   CHOL 179 09/19/2015 1116   CHOL 215 (H) 04/19/2015 1050   HDL 78.20 09/19/2015 1116   HDL 74 04/19/2015 1050   CHOLHDL 2 09/19/2015 1116   VLDL 25.8 09/19/2015 1116   LDLCALC 75 09/19/2015 1116   LDLCALC 110 (H) 04/19/2015 1050    Lab Results  Component Value Date   AST 21 04/06/2016   Lab Results  Component Value Date   ALT 25  04/06/2016   No components found for: ALKALINEPHOPHATASE No components found for: BILIRUBINTOTAL  No results found for: ESTRADIOL  Urinalysis Moderate bacteria.  See Epic.   I have reviewed the labs.   Pertinent Imaging: CLINICAL DATA:  Left lower abdominal pain since April.  Nausea  EXAM: CT ABDOMEN AND PELVIS WITH CONTRAST  TECHNIQUE: Multidetector CT imaging of the abdomen and pelvis was performed using the standard protocol following bolus administration of intravenous contrast.  CONTRAST:  41mL ISOVUE-300 IOPAMIDOL (ISOVUE-300) INJECTION 61%  COMPARISON:  None.  FINDINGS: Lower chest:  Negative.  Hepatobiliary: No focal liver abnormality.No evidence of biliary obstruction or stone.  Pancreas: Unremarkable.  Spleen: Unremarkable.  Adrenals/Urinary Tract: Negative adrenals. No hydronephrosis or stone. Unremarkable bladder.  Stomach/Bowel: No obstruction. No appendicitis. Mild distal colonic diverticulosis.  Vascular/Lymphatic: No acute vascular abnormality. Mild atherosclerotic calcification, especially for age. There is bilateral SFA calcified plaque. No mass or adenopathy.  Reproductive:Coarse calcifications in the uterus consistent with hyalinized fibroids.  Other: No ascites or pneumoperitoneum.  Musculoskeletal: No acute abnormalities.  IMPRESSION: 1. No specific explanation for symptoms. 2. Mild distal colonic diverticulosis. 3.  Aortic Atherosclerosis (ICD10-I70.0), mild   Electronically Signed   By: Monte Fantasia M.D.   On: 09/09/2017 14:38 I have independently reviewed the films.    Assessment & Plan:    1. Dysuria - Urinalysis, Complete - will send for culture as she will be schedule for cystoscopy - CULTURE, URINE COMPREHENSIVE  2. History of urethral diverticulum Patient with pain in suprapubic region, dysuria and indurated painful urethra Will undergo cystoscopy for further evaluation I have explained to the  patient that they will  be scheduled for a cystoscopy in our office to evaluate their bladder.  The cystoscopy consists of passing a tube with a lens up through their urethra and into their urinary bladder.   We will inject the urethra with a lidocaine gel prior to introducing the cystoscope to help with any discomfort during the procedure.   After the procedure, they might experience blood in the urine and discomfort with urination.  This will abate after the first few voids.  I have  encouraged the patient to increase water intake  during this time.  Patient denies any allergies to lidocaine.    3. Vaginal atrophy Patient was given a sample of vaginal estrogen cream (Premarin vaginal cream) and instructed to apply 0.5mg  (  pea-sized amount)  just inside the vaginal introitus with a finger-tip on Monday, Wednesday and Friday nights.  I explained to the patient that vaginally administered estrogen, which causes only a slight increase in the blood estrogen levels, have fewer contraindications and adverse systemic effects that oral HT. I have also given prescriptions for the Estrace cream and Premarin cream, so that the patient may carry them to the pharmacy to see which one of the branded creams would be most economical for her.  If she finds both medications cost prohibitive, she is instructed to call the office.  We can then call in a compounded vaginal estrogen cream for the patient that may be more affordable.   She will follow up in three months for an exam.           Return for cystoscopy for dysuria with Dr. Erlene Quan (patient wants a female provider).  These notes generated with voice recognition software. I apologize for typographical errors.  Zara Council, PA-C  [facility] [address]  [place], [place] [address] [phone]

## 2017-12-08 LAB — CULTURE, URINE COMPREHENSIVE

## 2017-12-19 ENCOUNTER — Other Ambulatory Visit: Payer: Self-pay | Admitting: Family Medicine

## 2017-12-19 NOTE — Telephone Encounter (Signed)
Patient requesting refill on albuterol inhaler previously prescribed by Dr. Lacinda Axon last Visit with you on 10/18/17 DX of Asthma ok to fill rescue inhaler.

## 2017-12-20 ENCOUNTER — Encounter: Payer: Self-pay | Admitting: Cardiovascular Disease

## 2017-12-20 ENCOUNTER — Ambulatory Visit (INDEPENDENT_AMBULATORY_CARE_PROVIDER_SITE_OTHER): Payer: Medicare HMO | Admitting: Cardiovascular Disease

## 2017-12-20 VITALS — BP 132/78 | HR 92 | Ht 60.0 in | Wt 127.8 lb

## 2017-12-20 DIAGNOSIS — E785 Hyperlipidemia, unspecified: Secondary | ICD-10-CM

## 2017-12-20 DIAGNOSIS — R079 Chest pain, unspecified: Secondary | ICD-10-CM

## 2017-12-20 DIAGNOSIS — I10 Essential (primary) hypertension: Secondary | ICD-10-CM

## 2017-12-20 NOTE — Progress Notes (Signed)
Cardiology Office Note   Date:  12/20/2017   ID:  Felicia Roberts, DOB [birth year], MRN [id number]  PCP:  Leone Haven, MD  Cardiologist:  Kathlyn Sacramento, MD   Chief Complaint  Patient presents with  . New Patient (Initial Visit)    Chest Pain woke one day with it.Went away that day then had it another day. Medications reviewed verbally.       History of Present Illness: Felicia Roberts is a 77 y.o. female who was referred by Dr. Caryl Bis for evaluation of chest pain.  She has no prior cardiac history.  She has multiple chronic medical conditions including prolonged history of uncontrolled type 2 diabetes, hypertension, hyperlipidemia and asthma.  She also has strong family history of premature coronary artery disease.  Her mother died at the age of 51 of myocardial infarction and brother died at the age of 39 of myocardial infarction.  No previous cardiac testing.  The patient is not a smoker. She had an episode of chest pain in July around 3:00 in the morning which woke her up from sleep.  It was described as sharp discomfort radiating to the right side of the chest.  She did not seek medical attention at that time.  She had another episode in August but not as intense.  Both these episodes happen at rest.  She did not have any heartburn.  She describes exertional dyspnea but no exertional chest pain.    Past Medical History:  Diagnosis Date  . Asthma   . Chicken pox   . Colon polyps   . Diabetes mellitus without complication ([place])   . Diverticulosis   . Hyperlipidemia   . Hypertension     Past Surgical History:  Procedure Laterality Date  . BLADDER SURGERY    . COLONOSCOPY WITH PROPOFOL N/A 09/25/2017   Procedure: COLONOSCOPY WITH PROPOFOL;  Surgeon: Toledo, Benay Pike, MD;  Location: [facility] ENDOSCOPY;  Service: Gastroenterology;  Laterality: N/A;  . ESOPHAGOGASTRODUODENOSCOPY (EGD) WITH PROPOFOL N/A 09/25/2017   Procedure: ESOPHAGOGASTRODUODENOSCOPY (EGD) WITH  PROPOFOL;  Surgeon: Toledo, Benay Pike, MD;  Location: [facility] ENDOSCOPY;  Service: Gastroenterology;  Laterality: N/A;  . URETHRAL DIVERTICULUM REPAIR  1980     Current Outpatient Medications  Medication Sig Dispense Refill  . albuterol (PROVENTIL HFA;VENTOLIN HFA) 108 (90 Base) MCG/ACT inhaler INHALE ONE TO TWO PUFFS BY MOUTH EVERY 6 HOURS AS NEEDED FOR WHEEZING OR  SHORTNESS  OF  BREATH 18 each 6  . albuterol (PROVENTIL) (2.5 MG/3ML) 0.083% nebulizer solution Take 3 mLs (2.5 mg total) by nebulization every 6 (six) hours as needed for wheezing or shortness of breath. 150 mL 1  . amLODipine (NORVASC) 5 MG tablet TAKE 1 TABLET BY MOUTH ONCE DAILY 90 tablet 3  . atorvastatin (LIPITOR) 40 MG tablet Take 1 tablet (40 mg total) by mouth daily. 90 tablet 3  . beclomethasone (QVAR) 80 MCG/ACT inhaler Inhale 1 puff into the lungs 2 (two) times daily. 1 Inhaler 12  . conjugated estrogens (PREMARIN) vaginal cream Apply 0.5mg  (pea-sized amount)  just inside the vaginal introitus with a finger-tip on  Monday, Wednesday and Friday nights. 30 g 12  . estradiol (ESTRACE VAGINAL) 0.1 MG/GM vaginal cream Apply 0.5mg  (pea-sized amount)  just inside the vaginal introitus with a finger-tip on Monday, Wednesday and Friday nights. 30 g 12  . glyBURIDE (DIABETA) 5 MG tablet Take 1 tablet (5 mg total) by mouth 2 (two) times daily. 180 tablet 3  . lisinopril (PRINIVIL,ZESTRIL) 20 MG  tablet TAKE 1 TABLET BY MOUTH ONCE DAILY 90 tablet 3  . meloxicam (MOBIC) 15 MG tablet Take 1 tablet (15 mg total) by mouth daily. 10 tablet 0  . metFORMIN (GLUCOPHAGE-XR) 500 MG 24 hr tablet Take 1,000 mg by mouth daily after supper.     . nortriptyline (PAMELOR) 25 MG capsule Take 25 mg by mouth.    . pioglitazone (ACTOS) 15 MG tablet Take 15 mg by mouth daily.    . Semaglutide,0.25 or 0.5MG /DOS, 2 MG/1.5ML SOPN Inject into the skin.     No current facility-administered medications for this visit.     Allergies:   Januvia [sitagliptin]     Social History:  The patient  reports that she has never smoked. She has never used smokeless tobacco. She reports that she does not drink alcohol or use drugs.   Family History:  The patient's family history includes AAA (abdominal aortic aneurysm) in her brother; Cancer in her father; Diabetes in her mother, sister, sister, sister, and sister; Heart disease in her mother; Hypertension in her mother, sister, sister, sister, and sister.    ROS:  Please see the history of present illness.   Otherwise, review of systems are positive for none.   All other systems are reviewed and negative.    PHYSICAL EXAM: VS:  BP 132/78   Pulse 92   Ht 5' (1.524 m)   Wt 127 lb 12.8 oz (58 kg)   BMI 24.96 kg/m  , BMI Body mass index is 24.96 kg/m. GEN: Well nourished, well developed, in no acute distress  HEENT: normal  Neck: no JVD, carotid bruits, or masses Cardiac: RRR; no murmurs, rubs, or gallops,no edema  Respiratory:  clear to auscultation bilaterally, normal work of breathing GI: soft, nontender, nondistended, + BS MS: no deformity or atrophy  Skin: warm and dry, no rash Neuro:  Strength and sensation are intact Psych: euthymic mood, full affect   EKG:  EKG is ordered today. The ekg ordered today demonstrates normal sinus rhythm with sinus arrhythmia and low voltage.   Recent Labs: No results found for requested labs within last 8760 hours.    Lipid Panel    Component Value Date/Time   CHOL 179 09/19/2015 1116   CHOL 215 (H) 04/19/2015 1050   TRIG 129.0 09/19/2015 1116   HDL 78.20 09/19/2015 1116   HDL 74 04/19/2015 1050   CHOLHDL 2 09/19/2015 1116   VLDL 25.8 09/19/2015 1116   LDLCALC 75 09/19/2015 1116   LDLCALC 110 (H) 04/19/2015 1050      Wt Readings from Last 3 Encounters:  12/20/17 127 lb 12.8 oz (58 kg)  12/05/17 129 lb 12.8 oz (58.9 kg)  10/18/17 132 lb 1.9 oz (59.9 kg)        No flowsheet data found.    ASSESSMENT AND PLAN:  1.  Atypical chest  pain: Multiple risk factors for coronary artery disease with no previous ischemic cardiac evaluation.  I requested a Lexiscan Myoview.  She is not able to exercise on a treadmill. Continue aggressive treatment of risk factors as she is at high risk for atherosclerotic cardiovascular disease.  2.  Essential hypertension: Blood pressures controlled on current medications.  3.  Hyperlipidemia: Currently on atorvastatin 40 mg daily.  No recent lipid profile.  Recommend a target LDL of less than 70 given that she is diabetic.   Disposition:   FU with me as needed  Signed,  Kathlyn Sacramento, MD  12/20/2017 12:00 PM  [facility]

## 2017-12-20 NOTE — Patient Instructions (Signed)
Medication Instructions: Your physician recommends that you continue on your current medications as directed. Please refer to the Current Medication list given to you today.  If you need a refill on your cardiac medications before your next appointment, please call your pharmacy.   Procedures/Testing: Your physician has requested that you have a lexiscan myoview. For further information please visit [email or web address]. Please follow instruction sheet, as given.  Follow-Up: Your physician wants you to follow-up as needed with Dr. Fletcher Anon.   Special Instructions:   [place]  Your provider has ordered a Stress Test with nuclear imaging. The purpose of this test is to evaluate the blood supply to your heart muscle. This procedure is referred to as a "Non-Invasive Stress Test." This is because other than having an IV started in your vein, nothing is inserted or "invades" your body. Cardiac stress tests are done to find areas of poor blood flow to the heart by determining the extent of coronary artery disease (CAD). Some patients exercise on a treadmill, which naturally increases the blood flow to your heart, while others who are unable to walk on a treadmill due to physical limitations have a pharmacologic/chemical stress agent called Lexiscan . This medicine will mimic walking on a treadmill by temporarily increasing your coronary blood flow.   Please note: these test may take anywhere between 2-4 hours to complete  PLEASE REPORT TO [facility] AT THE FIRST DESK WILL DIRECT YOU WHERE TO GO  Date of Procedure:_____________________________________  Arrival Time for Procedure:______________________________  Instructions regarding medication:   __X__ : Hold diabetes medication morning of procedure  PLEASE NOTIFY THE OFFICE AT LEAST 24 HOURS IN ADVANCE IF YOU ARE UNABLE TO KEEP YOUR APPOINTMENT.  [phone] AND  PLEASE NOTIFY NUCLEAR MEDICINE AT [facility] AT  LEAST 24 HOURS IN ADVANCE IF YOU ARE UNABLE TO KEEP YOUR APPOINTMENT. [phone]  How to prepare for your Myoview test:  1. Do not eat or drink after midnight 2. No caffeine for 24 hours prior to test 3. No smoking 24 hours prior to test. 4. Your medication may be taken with water.  If your doctor stopped a medication because of this test, do not take that medication. 5. Ladies, please do not wear dresses.  Skirts or pants are appropriate. Please wear a short sleeve shirt. 6. No perfume, cologne or lotion. 7. Wear comfortable walking shoes. No heels!    Thank you for choosing [facility] at [facility]!

## 2017-12-20 NOTE — Addendum Note (Signed)
Addended by: Alba Destine on: 12/20/2017 03:37 PM   Modules accepted: Orders

## 2017-12-26 ENCOUNTER — Encounter
Admission: RE | Admit: 2017-12-26 | Discharge: 2017-12-26 | Disposition: A | Discharge status: Home / Self Care | Payer: Medicare HMO | Source: Ambulatory Visit | Attending: Cardiovascular Disease | Admitting: Cardiovascular Disease

## 2017-12-26 DIAGNOSIS — R079 Chest pain, unspecified: Secondary | ICD-10-CM | POA: Diagnosis not present

## 2017-12-26 LAB — NM MYOCAR MULTI W/SPECT W/WALL MOTION / EF
CHL CUP NUCLEAR SDS: 0
CHL CUP RESTING HR STRESS: 66 {beats}/min
CSEPEDS: 0 s
CSEPEW: 1 METS
CSEPHR: 79 %
Exercise duration (min): 0 min
LV sys vol: 12 mL
LVDIAVOL: 41 mL (ref 46–106)
MPHR: 144 {beats}/min
Peak HR: 115 {beats}/min
SRS: 5
SSS: 5
TID: 0.65

## 2017-12-26 MED ORDER — TECHNETIUM TC 99M TETROFOSMIN IV KIT
32.0900 | PACK | Freq: Once | INTRAVENOUS | Status: AC | PRN
  Administered 2017-12-26: 32.09 via INTRAVENOUS

## 2017-12-26 MED ORDER — REGADENOSON 0.4 MG/5ML IV SOLN
0.4000 mg | Freq: Once | INTRAVENOUS | Status: AC
  Administered 2017-12-26: 0.4 mg via INTRAVENOUS

## 2017-12-26 MED ORDER — TECHNETIUM TC 99M TETROFOSMIN IV KIT
10.0000 | PACK | Freq: Once | INTRAVENOUS | Status: AC | PRN
  Administered 2017-12-26: 8.89 via INTRAVENOUS

## 2017-12-28 DIAGNOSIS — R69 Illness, unspecified: Secondary | ICD-10-CM | POA: Diagnosis not present

## 2017-12-31 ENCOUNTER — Ambulatory Visit (INDEPENDENT_AMBULATORY_CARE_PROVIDER_SITE_OTHER): Payer: Medicare HMO | Admitting: Urology

## 2017-12-31 ENCOUNTER — Encounter: Payer: Self-pay | Admitting: Urology

## 2017-12-31 VITALS — BP 145/75 | HR 73 | Ht 60.0 in | Wt 127.0 lb

## 2017-12-31 DIAGNOSIS — R300 Dysuria: Secondary | ICD-10-CM | POA: Diagnosis not present

## 2017-12-31 DIAGNOSIS — N952 Postmenopausal atrophic vaginitis: Secondary | ICD-10-CM

## 2017-12-31 LAB — URINALYSIS, COMPLETE
Bilirubin, UA: NEGATIVE
GLUCOSE, UA: NEGATIVE
Ketones, UA: NEGATIVE
Leukocytes, UA: NEGATIVE
Nitrite, UA: NEGATIVE
PROTEIN UA: NEGATIVE
UUROB: 0.2 mg/dL (ref 0.2–1.0)
pH, UA: 5.5 (ref 5.0–7.5)

## 2017-12-31 LAB — MICROSCOPIC EXAMINATION

## 2017-12-31 MED ORDER — LIDOCAINE HCL URETHRAL/MUCOSAL 2 % EX GEL
1.0000 "application " | Freq: Once | CUTANEOUS | Status: AC
  Administered 2017-12-31: 1 via URETHRAL

## 2017-12-31 NOTE — Progress Notes (Signed)
   12/31/17  CC:  Chief Complaint  Patient presents with  . Cysto    HPI: 77 year old female with vaginal dryness, burning in the suprapubic and vaginal area who presents today for cystoscopy for further evaluation of this.  She was seen and evaluated by Zara Council and started on topical estrogen cream which she is using.  She is only on this medication for a few weeks.  She notices some mild improvement in her symptoms.  She also mentions today that her urinary stream is slow and she feels like she is not emptying her bladder completely.  Her PVR in the office at the time of initial evaluation was minimal.  Blood pressure (!) 145/75, pulse 73, height 5' (1.524 m), weight 127 lb (57.6 kg). NED. A&Ox3.   No respiratory distress   Abd soft, NT, ND Normal external genitalia with patent urethral meatus, atrophic mucosa appreciated  Cystoscopy Procedure Note  Patient identification was confirmed, informed consent was obtained, and patient was prepped using Betadine solution.  Lidocaine jelly was administered per urethral meatus.    Procedure: - Flexible cystoscope introduced, without any difficulty.   - Thorough search of the bladder revealed:    normal urethral meatus    normal urothelium    no stones    no ulcers     no tumors    no urethral polyps    no trabeculation  - Ureteral orifices were normal in position and appearance.  Post-Procedure: - Patient tolerated the procedure well  Assessment/ Plan:  1. Dysuria Cystoscopy today unremarkable, no concern for underlying anatomic issues involving the bladder No evidence of UTI Etiology of suprapubic "burning" unclear - Urinalysis, Complete - lidocaine (XYLOCAINE) 2 % jelly 1 application  2. Vaginal atrophy Recommend continuation of topical estrogen cream    Return in about 3 months (around 04/02/2018) for [facility] for PVR, symptoms recheck.  Hollice Espy, MD

## 2018-01-10 DIAGNOSIS — R69 Illness, unspecified: Secondary | ICD-10-CM | POA: Diagnosis not present

## 2018-01-29 DIAGNOSIS — R69 Illness, unspecified: Secondary | ICD-10-CM | POA: Diagnosis not present

## 2018-02-07 ENCOUNTER — Ambulatory Visit: Payer: Medicare HMO | Admitting: Family Medicine

## 2018-02-19 DIAGNOSIS — E11649 Type 2 diabetes mellitus with hypoglycemia without coma: Secondary | ICD-10-CM | POA: Diagnosis not present

## 2018-02-28 DIAGNOSIS — E1122 Type 2 diabetes mellitus with diabetic chronic kidney disease: Secondary | ICD-10-CM | POA: Diagnosis not present

## 2018-02-28 DIAGNOSIS — E11649 Type 2 diabetes mellitus with hypoglycemia without coma: Secondary | ICD-10-CM | POA: Diagnosis not present

## 2018-02-28 DIAGNOSIS — N183 Chronic kidney disease, stage 3 (moderate): Secondary | ICD-10-CM | POA: Diagnosis not present

## 2018-04-02 NOTE — Progress Notes (Signed)
04/04/2018 9:23 AM   Felicia Roberts 01/26/1941 [id number]  Referring provider: Leone Haven, MD [address] [place], [place] [address]  Chief Complaint  Patient presents with   Follow-up    HPI: Patient is a 78 year old Hispanic female who presents for PVR and a symptom check.  Background History Patient was a referral from Laurine Blazer, [place] with a complaints of vaginal dryness and burning in the suprapubic and vaginal area.   Patient stated that she has had the sensation of burning in the suprapubic region and vaginal area for the last several weeks during her 12/05/2017 visit.  She has a history of a urethral diverticulum that was surgically treated in 1984.  She states the symptoms are somewhat similar and she is fearful as she went into urinary retention with the urethral diverticulum.  She stated she has not been having recurrent urinary tract infections.  She denied any urinary incontinence.  Patient denied any gross hematuria, dysuria or suprapubic/flank pain.  Patient denied any fevers, chills, nausea or vomiting.   She also stated that her vagina was dry.   Her UA on 12/05/2017 was positive for moderate bacteria and her PVR was 34 mL.  Previous urine culture was negative at [facility] office.    Contrast CT in 08/2017 by GI for left lower quadrant pain and nausea revealed negative adrenals. No hydronephrosis or stone. Unremarkable bladder.    Cytoscopy on 12/31/2017 was unremarkable, with no evidence of UTI and no concern for underlying anatomic issues involving the bladder.  On 04/04/2018, patient reports that she feels still full despite having emptied her bladder, and that feels like she has to run to the bathroom all the time.  Nothing helps or makes the urgency worse.  Patient reports she is using the vaginal cream 3 times a week (MWF).   Her PVR is 0 mL.  Patient denies any gross hematuria, dysuria or suprapubic/flank pain.  Patient denies any fevers,  chills, nausea or vomiting.   PMH: Past Medical History:  Diagnosis Date   Asthma    Chicken pox    Colon polyps    Diabetes mellitus without complication ([place])    Diverticulosis    Hyperlipidemia    Hypertension     Surgical History: Past Surgical History:  Procedure Laterality Date   BLADDER SURGERY     COLONOSCOPY WITH PROPOFOL N/A 09/25/2017   Procedure: COLONOSCOPY WITH PROPOFOL;  Surgeon: Toledo, Benay Pike, MD;  Location: [facility] ENDOSCOPY;  Service: Gastroenterology;  Laterality: N/A;   ESOPHAGOGASTRODUODENOSCOPY (EGD) WITH PROPOFOL N/A 09/25/2017   Procedure: ESOPHAGOGASTRODUODENOSCOPY (EGD) WITH PROPOFOL;  Surgeon: Toledo, Benay Pike, MD;  Location: [facility] ENDOSCOPY;  Service: Gastroenterology;  Laterality: N/A;   URETHRAL DIVERTICULUM REPAIR  1980    Home Medications:  Allergies as of 04/04/2018      Reactions   Januvia [sitagliptin] Rash      Medication List       Accurate as of April 04, 2018  9:23 AM. Always use your most recent med list.        albuterol (2.5 MG/3ML) 0.083% nebulizer solution Commonly known as:  PROVENTIL Take 3 mLs (2.5 mg total) by nebulization every 6 (six) hours as needed for wheezing or shortness of breath.   albuterol 108 (90 Base) MCG/ACT inhaler Commonly known as:  PROVENTIL HFA;VENTOLIN HFA INHALE ONE TO TWO PUFFS BY MOUTH EVERY 6 HOURS AS NEEDED FOR WHEEZING OR  SHORTNESS  OF  BREATH   amLODipine 5  MG tablet Commonly known as:  NORVASC TAKE 1 TABLET BY MOUTH ONCE DAILY   atorvastatin 40 MG tablet Commonly known as:  LIPITOR Take 1 tablet (40 mg total) by mouth daily.   beclomethasone 80 MCG/ACT inhaler Commonly known as:  QVAR Inhale 1 puff into the lungs 2 (two) times daily.   conjugated estrogens vaginal cream Commonly known as:  PREMARIN Apply 0.5mg  (pea-sized amount)  just inside the vaginal introitus with a finger-tip on  Monday, Wednesday and Friday nights.   estradiol 0.1 MG/GM vaginal cream Commonly  known as:  ESTRACE VAGINAL Apply 0.5mg  (pea-sized amount)  just inside the vaginal introitus with a finger-tip on Monday, Wednesday and Friday nights.   fesoterodine 4 MG Tb24 tablet Commonly known as:  TOVIAZ Take 1 tablet (4 mg total) by mouth daily.   glyBURIDE 5 MG tablet Commonly known as:  DIABETA Take 1 tablet (5 mg total) by mouth 2 (two) times daily.   lisinopril 20 MG tablet Commonly known as:  PRINIVIL,ZESTRIL TAKE 1 TABLET BY MOUTH ONCE DAILY   meloxicam 15 MG tablet Commonly known as:  MOBIC Take 1 tablet (15 mg total) by mouth daily.   metFORMIN 500 MG 24 hr tablet Commonly known as:  GLUCOPHAGE-XR Take 1,000 mg by mouth daily after supper.   nortriptyline 25 MG capsule Commonly known as:  PAMELOR Take 25 mg by mouth.   pioglitazone 15 MG tablet Commonly known as:  ACTOS Take 15 mg by mouth daily.   Semaglutide(0.25 or 0.5MG /DOS) 2 MG/1.5ML Sopn Inject into the skin.       Allergies:  Allergies  Allergen Reactions   Januvia [Sitagliptin] Rash    Family History: Family History  Problem Relation Age of Onset   Cancer Father        prostate   Diabetes Sister    Hypertension Sister    AAA (abdominal aortic aneurysm) Brother    Diabetes Sister    Hypertension Sister    Diabetes Sister    Hypertension Sister    Diabetes Sister    Hypertension Sister    Hypertension Mother    Heart disease Mother    Diabetes Mother    Breast cancer Neg Hx     Social History:  reports that she has never smoked. She has never used smokeless tobacco. She reports that she does not drink alcohol or use drugs.  ROS: UROLOGY Frequent Urination?: No Hard to postpone urination?: No Burning/pain with urination?: No Get up at night to urinate?: No Leakage of urine?: No Urine stream starts and stops?: No Trouble starting stream?: No Do you have to strain to urinate?: No Blood in urine?: No Urinary tract infection?: No Sexually transmitted  disease?: No Injury to kidneys or bladder?: No Painful intercourse?: No Weak stream?: No Currently pregnant?: No Vaginal bleeding?: No Last menstrual period?: n  Gastrointestinal Nausea?: No Vomiting?: No Indigestion/heartburn?: No Diarrhea?: No Constipation?: No  Constitutional Fever: No Night sweats?: No Weight loss?: No Fatigue?: No  Skin Skin rash/lesions?: No Itching?: No  Eyes Blurred vision?: No Double vision?: No  Ears/Nose/Throat Sore throat?: No Sinus problems?: No  Hematologic/Lymphatic Swollen glands?: No Easy bruising?: No  Cardiovascular Leg swelling?: No Chest pain?: No  Respiratory Cough?: No Shortness of breath?: No  Endocrine Excessive thirst?: No  Musculoskeletal Back pain?: No Joint pain?: No  Neurological Headaches?: No Dizziness?: No  Psychologic Depression?: No Anxiety?: No  Physical Exam: BP (!) 170/95    Pulse 89    Ht 5' (  1.524 m)    Wt 130 lb (59 kg)    BMI 25.39 kg/m   Constitutional: Well nourished. Alert and oriented, No acute distress. Cardiovascular: No clubbing, cyanosis, or edema. Respiratory: Normal respiratory effort, no increased work of breathing. GU: No CVA tenderness.  No bladder fullness or masses.  Atrophic external genitalia, sparce pubic hair distribution, no lesions.  Normal urethral meatus, no lesions, no prolapse, no discharge.   No urethral masses, tenderness and/or tenderness. No bladder fullness, tenderness or masses. Pale vagina mucosa, fair estrogen effect, no discharge, no lesions, fair pelvic support, grade 1 cystocele and no rectocele noted.  No cervical motion tenderness.  Uterus is freely mobile and non-fixed.  No adnexal/parametria masses or tenderness noted.  Anus and perineum are without rashes or lesions.  Skin: No rashes, bruises or suspicious lesions. Lymph: No cervical or inguinal adenopathy. Neurologic: Grossly intact, no focal deficits, moving all 4 extremities. Psychiatric: Normal  mood and affect.   Laboratory Data: Lab Results  Component Value Date   WBC 8.1 04/06/2016   HGB 14.2 04/06/2016   HCT 42.6 04/06/2016   MCV 91.7 04/06/2016   PLT 387.0 04/06/2016    Lab Results  Component Value Date   CREATININE 1.09 04/06/2016    No results found for: PSA  No results found for: TESTOSTERONE  Lab Results  Component Value Date   HGBA1C 9.5 (H) 04/06/2016    Lab Results  Component Value Date   TSH 2.340 04/19/2015       Component Value Date/Time   CHOL 179 09/19/2015 1116   CHOL 215 (H) 04/19/2015 1050   HDL 78.20 09/19/2015 1116   HDL 74 04/19/2015 1050   CHOLHDL 2 09/19/2015 1116   VLDL 25.8 09/19/2015 1116   LDLCALC 75 09/19/2015 1116   LDLCALC 110 (H) 04/19/2015 1050    Lab Results  Component Value Date   AST 21 04/06/2016   Lab Results  Component Value Date   ALT 25 04/06/2016   No components found for: ALKALINEPHOPHATASE No components found for: BILIRUBINTOTAL  No results found for: ESTRADIOL  I have reviewed the labs.  Assessment & Plan:    1. Urgency - Patient has been given Toviaz 4 mg daily. I have advised patient of the side effects of Toviaz, such as: Dry eyes, dry mouth, constipation, mental confusion and/or urinary retention. - Myrbetriq could not be used due contraindications with nortriptyline  - She will follow up in 3 weeks for OAB questionnaire and PVR.  2. History of urethral diverticulum -  Cystoscopy underwent at 12/31/2017, NED.  3. Vaginal atrophy - Continue vaginal estrogen cream (Premarin vaginal cream) Monday, Wednesday and Friday nights.  Return in about 3 weeks (around 04/25/2018) for PVR and OAB questionnaire.  I, Adele Schilder, am acting as a Education administrator for [employer], PA-C.   I have reviewed the above documentation for accuracy and completeness, and I agree with the above.    Zara Council, PA-C  [facility] [address]  [place], [place]  [address] [phone]

## 2018-04-04 ENCOUNTER — Ambulatory Visit (INDEPENDENT_AMBULATORY_CARE_PROVIDER_SITE_OTHER): Payer: Medicare HMO | Admitting: Urology

## 2018-04-04 ENCOUNTER — Encounter: Payer: Self-pay | Admitting: Urology

## 2018-04-04 VITALS — BP 170/95 | HR 89 | Ht 60.0 in | Wt 130.0 lb

## 2018-04-04 DIAGNOSIS — R3915 Urgency of urination: Secondary | ICD-10-CM | POA: Diagnosis not present

## 2018-04-04 DIAGNOSIS — N952 Postmenopausal atrophic vaginitis: Secondary | ICD-10-CM

## 2018-04-04 LAB — BLADDER SCAN AMB NON-IMAGING: SCAN RESULT: 0

## 2018-04-04 MED ORDER — FESOTERODINE FUMARATE ER 4 MG PO TB24: 4.0000 mg | ORAL_TABLET | Freq: Every day | ORAL | 0 refills | Status: DC

## 2018-04-07 ENCOUNTER — Other Ambulatory Visit: Payer: Self-pay | Admitting: Ophthalmology

## 2018-04-07 DIAGNOSIS — H5712 Ocular pain, left eye: Secondary | ICD-10-CM

## 2018-04-07 LAB — HM DIABETES EYE EXAM

## 2018-04-11 ENCOUNTER — Ambulatory Visit: Payer: Medicare HMO

## 2018-04-11 ENCOUNTER — Ambulatory Visit
Admission: RE | Admit: 2018-04-11 | Discharge: 2018-04-11 | Disposition: A | Discharge status: Home / Self Care | Payer: Medicare HMO | Source: Ambulatory Visit | Attending: Ophthalmology | Admitting: Ophthalmology

## 2018-04-11 DIAGNOSIS — H5712 Ocular pain, left eye: Secondary | ICD-10-CM | POA: Diagnosis not present

## 2018-04-11 LAB — POCT I-STAT CREATININE: Creatinine, Ser: 1.2 mg/dL — ABNORMAL HIGH (ref 0.44–1.00)

## 2018-04-11 MED ORDER — IOPAMIDOL (ISOVUE-300) INJECTION 61%
60.0000 mL | Freq: Once | INTRAVENOUS | Status: AC | PRN
  Administered 2018-04-11: 60 mL via INTRAVENOUS

## 2018-04-21 ENCOUNTER — Other Ambulatory Visit: Payer: Self-pay | Admitting: Family Medicine

## 2018-04-21 DIAGNOSIS — I10 Essential (primary) hypertension: Secondary | ICD-10-CM

## 2018-04-24 ENCOUNTER — Other Ambulatory Visit: Payer: Self-pay | Admitting: Internal Medicine

## 2018-04-24 DIAGNOSIS — E7800 Pure hypercholesterolemia, unspecified: Secondary | ICD-10-CM | POA: Insufficient documentation

## 2018-04-24 DIAGNOSIS — N952 Postmenopausal atrophic vaginitis: Secondary | ICD-10-CM | POA: Insufficient documentation

## 2018-04-24 DIAGNOSIS — Z1231 Encounter for screening mammogram for malignant neoplasm of breast: Secondary | ICD-10-CM

## 2018-04-24 NOTE — Progress Notes (Signed)
04/25/2018  10:12 AM   Felicia Roberts 1940/04/21 [id number]  Referring provider: Leone Haven, MD [address] [place], [place] [address]  Chief Complaint  Patient presents with  . Urinary Urgency    HPI: Felicia Roberts is a 78 y.o. Hispanic female who presents for PVR and an OAB questionaire.  Background History Patient was a referral from Laurine Blazer, [place] with a complaints of vaginal dryness and burning in the suprapubic and vaginal area.   Patient stated that she has had the sensation of burning in the suprapubic region and vaginal area for the last several weeks during her 12/05/2017 visit.  She has a history of a urethral diverticulum that was surgically treated in 1984.  She states the symptoms are somewhat similar and she is fearful as she went into urinary retention with the urethral diverticulum.  She stated she has not been having recurrent urinary tract infections.  She denied any urinary incontinence.  Patient denied any gross hematuria, dysuria or suprapubic/flank pain.  Patient denied any fevers, chills, nausea or vomiting.   She also stated that her vagina was dry.   Her UA on 12/05/2017 was positive for moderate bacteria and her PVR was 34 mL.  Previous urine culture was negative at [facility] office.    Contrast CT in 08/2017 by GI for left lower quadrant pain and nausea revealed negative adrenals. No hydronephrosis or stone. Unremarkable bladder.    Cytoscopy on 12/31/2017 was unremarkable, with no evidence of UTI and no concern for underlying anatomic issues involving the bladder.  On 04/04/2018, patient reported that she felt still full despite having emptied her bladder, and that she felt like she has to run to the bathroom all the time.  Nothing helped or made the urgency worse.  Patient reported that she is using the vaginal cream 3 times a week (MWF).   Her PVR was 0 mL.  Patient denied any gross hematuria, dysuria or suprapubic/flank pain.   Patient denied any fevers, chills, nausea or vomiting.  On today's visit (04/25/2018) the patient is experiencing urgency x >8, frequency x >8, is restricting fluids to avoid visits to the restroom 4-7, is engaging in toilet mapping, incontinence x 0-3 and nocturia x 4-7.   Her BP is 124/82.   Her PVR is 5 mL.    Patient reports that the Toviaz caused memory loss.  Patient says her A1c has improved lately, and reports that her urinary symptoms did improve some.  Discussed PTNS as an option as patient cannot tolerate Toviaz.  Patient is agreeable; office will check with patient's insurance regarding coverage.  PMH: Past Medical History:  Diagnosis Date  . Asthma   . Chicken pox   . Colon polyps   . Diabetes mellitus without complication ([place])   . Diverticulosis   . Hyperlipidemia   . Hypertension     Surgical History: Past Surgical History:  Procedure Laterality Date  . BLADDER SURGERY    . COLONOSCOPY WITH PROPOFOL N/A 09/25/2017   Procedure: COLONOSCOPY WITH PROPOFOL;  Surgeon: Toledo, Benay Pike, MD;  Location: [facility] ENDOSCOPY;  Service: Gastroenterology;  Laterality: N/A;  . ESOPHAGOGASTRODUODENOSCOPY (EGD) WITH PROPOFOL N/A 09/25/2017   Procedure: ESOPHAGOGASTRODUODENOSCOPY (EGD) WITH PROPOFOL;  Surgeon: Toledo, Benay Pike, MD;  Location: [facility] ENDOSCOPY;  Service: Gastroenterology;  Laterality: N/A;  . URETHRAL DIVERTICULUM REPAIR  1980    Home Medications:  Allergies as of 04/25/2018      Reactions   Toviaz [fesoterodine Fumarate Er]  Other (See Comments)   Memory loss   Januvia [sitagliptin] Rash      Medication List       Accurate as of April 25, 2018 10:12 AM. Always use your most recent med list.        albuterol (2.5 MG/3ML) 0.083% nebulizer solution Commonly known as:  PROVENTIL Take 3 mLs (2.5 mg total) by nebulization every 6 (six) hours as needed for wheezing or shortness of breath.   albuterol 108 (90 Base) MCG/ACT inhaler Commonly known as:  PROVENTIL  HFA;VENTOLIN HFA INHALE ONE TO TWO PUFFS BY MOUTH EVERY 6 HOURS AS NEEDED FOR WHEEZING OR  SHORTNESS  OF  BREATH   amLODipine 5 MG tablet Commonly known as:  NORVASC TAKE 1 TABLET BY MOUTH ONCE DAILY   atorvastatin 40 MG tablet Commonly known as:  LIPITOR Take 1 tablet (40 mg total) by mouth daily.   beclomethasone 80 MCG/ACT inhaler Commonly known as:  QVAR Inhale 1 puff into the lungs 2 (two) times daily.   conjugated estrogens vaginal cream Commonly known as:  PREMARIN Apply 0.5mg  (pea-sized amount)  just inside the vaginal introitus with a finger-tip on  Monday, Wednesday and Friday nights.   estradiol 0.1 MG/GM vaginal cream Commonly known as:  ESTRACE VAGINAL Apply 0.5mg  (pea-sized amount)  just inside the vaginal introitus with a finger-tip on Monday, Wednesday and Friday nights.   fesoterodine 4 MG Tb24 tablet Commonly known as:  TOVIAZ Take 1 tablet (4 mg total) by mouth daily.   glyBURIDE 5 MG tablet Commonly known as:  DIABETA Take 1 tablet (5 mg total) by mouth 2 (two) times daily.   lisinopril 20 MG tablet Commonly known as:  PRINIVIL,ZESTRIL TAKE 1 TABLET BY MOUTH ONCE DAILY   meloxicam 15 MG tablet Commonly known as:  MOBIC Take 1 tablet (15 mg total) by mouth daily.   metFORMIN 500 MG 24 hr tablet Commonly known as:  GLUCOPHAGE-XR Take 1,000 mg by mouth daily after supper.   nortriptyline 25 MG capsule Commonly known as:  PAMELOR Take 25 mg by mouth.   pioglitazone 15 MG tablet Commonly known as:  ACTOS Take 15 mg by mouth daily.   QVAR REDIHALER 80 MCG/ACT inhaler Generic drug:  beclomethasone Inhale 1 puff into the lungs 2 (two) times daily.   Semaglutide(0.25 or 0.5MG /DOS) 2 MG/1.5ML Sopn Inject into the skin.       Allergies:  Allergies  Allergen Reactions  . Toviaz [Fesoterodine Fumarate Er] Other (See Comments)    Memory loss  . Januvia [Sitagliptin] Rash    Family History: Family History  Problem Relation Age of Onset  .  Cancer Father        prostate  . Diabetes Sister   . Hypertension Sister   . AAA (abdominal aortic aneurysm) Brother   . Diabetes Sister   . Hypertension Sister   . Diabetes Sister   . Hypertension Sister   . Diabetes Sister   . Hypertension Sister   . Hypertension Mother   . Heart disease Mother   . Diabetes Mother   . Breast cancer Neg Hx     Social History:  reports that she has never smoked. She has never used smokeless tobacco. She reports that she does not drink alcohol or use drugs.  ROS: UROLOGY Frequent Urination?: No Hard to postpone urination?: No Burning/pain with urination?: No Get up at night to urinate?: No Leakage of urine?: No Urine stream starts and stops?: No Trouble starting stream?: No Do  you have to strain to urinate?: No Blood in urine?: No Urinary tract infection?: No Sexually transmitted disease?: No Injury to kidneys or bladder?: No Painful intercourse?: No Weak stream?: No Currently pregnant?: No Vaginal bleeding?: No Last menstrual period?: n  Gastrointestinal Nausea?: No Vomiting?: No Indigestion/heartburn?: No Diarrhea?: No Constipation?: No  Constitutional Fever: No Night sweats?: No Weight loss?: No Fatigue?: No  Skin Skin rash/lesions?: No Itching?: No  Eyes Blurred vision?: No Double vision?: No  Ears/Nose/Throat Sore throat?: No Sinus problems?: No  Hematologic/Lymphatic Swollen glands?: No Easy bruising?: No  Cardiovascular Leg swelling?: No Chest pain?: No  Respiratory Cough?: No Shortness of breath?: No  Endocrine Excessive thirst?: No  Musculoskeletal Back pain?: No Joint pain?: No  Neurological Headaches?: No Dizziness?: No  Psychologic Depression?: No Anxiety?: No  Physical Exam: BP 124/82 (BP Location: Left Arm, Patient Position: Sitting)   Pulse 82   Ht 5' (1.524 m)   Wt 128 lb (58.1 kg)   BMI 25.00 kg/m   Constitutional:  Well nourished. Alert and oriented, No acute  distress. Cardiovascular: No clubbing, cyanosis, or edema. Respiratory: Normal respiratory effort, no increased work of breathing. Skin: No rashes, bruises or suspicious lesions. Neurologic: Grossly intact, no focal deficits, moving all 4 extremities. Psychiatric: Normal mood and affect.  Laboratory Data:  Lab Results  Component Value Date   CREATININE 1.20 (H) 04/11/2018   PVR Results for Felicia Roberts, Felicia Roberts (MRN [id number]) as of 04/25/2018 09:53  Ref. Range 12/05/2017 10:36 04/04/2018 08:52 04/25/2018 09:52  Scan Result Unknown 34 0 3ml   I have reviewed the labs.  Assessment & Plan:    1. Urgency - Patient was unable to tolerate Toviaz due to side effects - Myrbetriq could not be used due contraindications with nortriptyline - Explained the PTNS provides treatment by indirectly providing electrical stimulation to the nerves responsible for bladder and pelvic floor function - a needle electrode generates an adjustable electrical pulse that travels to the sacral plexus via the tibial nerve which is located in the ankle, among other functions, the sacral nerve plexus regulates bladder and pelvic floor function - treatment protocol requires once-a-week treatments for 12 weeks, 30 minutes per session and many patients begin to see improvements by the 6th treatment. Patients who respond to treatment may require occasional treatments (~ once every 3 weeks) to sustain improvements. PTNS is a low-risk procedure. The most common side-effects with PTNS treatment are temporary and minor, resulting from the placement of the needle electrode. They include minor bleeding, mild pain and skin inflammation and patients have seen up to an 80% success rate with this form of treatment - Office will check with patient's insurance regarding coverage and call patient to schelude  2. History of urethral diverticulum -  Cystoscopy underwent at 12/31/2017, NED.  3. Vaginal atrophy - Continue vaginal estrogen cream  (Premarin vaginal cream) Monday, Wednesday and Friday nights.  Return for schedule PTNS - will need to check insurance first .  I, Adele Schilder, am acting as a Education administrator for [employer], PA-C.  I have reviewed the above documentation for accuracy and completeness, and I agree with the above.    Zara Council, PA-C

## 2018-04-25 ENCOUNTER — Ambulatory Visit (INDEPENDENT_AMBULATORY_CARE_PROVIDER_SITE_OTHER): Payer: Medicare HMO | Admitting: Urology

## 2018-04-25 ENCOUNTER — Encounter: Payer: Self-pay | Admitting: Urology

## 2018-04-25 VITALS — BP 124/82 | HR 82 | Ht 60.0 in | Wt 128.0 lb

## 2018-04-25 DIAGNOSIS — R3915 Urgency of urination: Secondary | ICD-10-CM | POA: Diagnosis not present

## 2018-04-25 LAB — BLADDER SCAN AMB NON-IMAGING

## 2018-04-30 ENCOUNTER — Telehealth: Payer: Self-pay | Admitting: Urology

## 2018-04-30 NOTE — Telephone Encounter (Signed)
Faxed clinicals for PTNS Waiting for PA   [facility]

## 2018-05-09 ENCOUNTER — Ambulatory Visit: Payer: Medicare HMO

## 2018-05-09 ENCOUNTER — Telehealth: Payer: Self-pay

## 2018-05-09 NOTE — Telephone Encounter (Signed)
Called [name] and left a detailed VM advised to call back if needed. Will try to recall again on Monday to make sure she did get my VM. Did leave pt's name and DOB for liz.

## 2018-05-09 NOTE — Telephone Encounter (Signed)
Copied from [place] [id number]. Topic: General - Other >> May 09, 2018 11:39 AM Carolyn Stare wrote:  [name] with [facility] call to ask if it is ok for the pt to take Augmentin [phone] since she has kidney disease 1 pill 2 times a day for 10 days     Liz [address]

## 2018-05-09 NOTE — Telephone Encounter (Signed)
Based on a Creatinine clearance of 36 the patient does not need renally dosed augmentin and should be ok to proceed with the dose that is listed in the prior message.

## 2018-05-09 NOTE — Telephone Encounter (Signed)
Sent to PCP to advise 

## 2018-05-12 NOTE — Telephone Encounter (Signed)
Called Felicia Roberts and left a VM to call back.

## 2018-05-12 NOTE — Telephone Encounter (Signed)
Left a message to call back due to [name] calling back and was unable to speak at that time.

## 2018-05-12 NOTE — Telephone Encounter (Signed)
Called [name] and left a VM to call back. CRM created and sent to [employer] [facility] pool.

## 2018-05-12 NOTE — Telephone Encounter (Signed)
Felicia Roberts calling back again to speak with Felicia Roberts. She states a detailed message may be left with a yes or no for the medication.

## 2018-05-12 NOTE — Telephone Encounter (Signed)
Felicia Roberts from [facility] calling you back

## 2018-05-13 NOTE — Telephone Encounter (Signed)
Called [name] and left a detailed VM.

## 2018-05-20 NOTE — Telephone Encounter (Signed)
No I checked on it the other day and it is still pending   Sharyn Lull

## 2018-05-20 NOTE — Telephone Encounter (Signed)
Any news on her PTNS PA?

## 2018-08-29 ENCOUNTER — Other Ambulatory Visit: Payer: Self-pay

## 2018-08-29 ENCOUNTER — Ambulatory Visit (INDEPENDENT_AMBULATORY_CARE_PROVIDER_SITE_OTHER): Payer: Medicare HMO

## 2018-08-29 DIAGNOSIS — R3915 Urgency of urination: Secondary | ICD-10-CM

## 2018-08-29 NOTE — Progress Notes (Signed)
PTNS  Session # 1  Health & Social Factors: same Caffeine: 1-2 Alcohol: 0 Daytime voids #per day: 7 Night-time voids #per night: 3 Urgency: strong Incontinence Episodes #per day: 0 Ankle used: left Treatment Setting: 8 Feeling/ Response: sensory Comments: patient states she sometimes has to strain to urinate  Preformed By: Fonnie Jarvis, CMA   Follow Up: 1 week

## 2018-09-05 ENCOUNTER — Other Ambulatory Visit: Payer: Self-pay

## 2018-09-05 ENCOUNTER — Ambulatory Visit (INDEPENDENT_AMBULATORY_CARE_PROVIDER_SITE_OTHER): Payer: Medicare HMO

## 2018-09-05 DIAGNOSIS — R3915 Urgency of urination: Secondary | ICD-10-CM | POA: Diagnosis not present

## 2018-09-05 NOTE — Progress Notes (Signed)
PTNS  Session # 2  Health & Social Factors: No Change Caffeine: 1 coffee Alcohol: None Daytime voids #per day: 8-9 Night-time voids #per night: 3 Urgency: Strong Incontinence Episodes #per day: 0 Ankle used: Right Treatment Setting: 8 Feeling/ Response: Sensory Comments: N/A  Preformed By: Gordy Clement, CMA  Follow Up: RTC as scheduled

## 2018-09-12 ENCOUNTER — Ambulatory Visit (INDEPENDENT_AMBULATORY_CARE_PROVIDER_SITE_OTHER): Payer: Medicare HMO

## 2018-09-12 ENCOUNTER — Other Ambulatory Visit: Payer: Self-pay

## 2018-09-12 DIAGNOSIS — R3915 Urgency of urination: Secondary | ICD-10-CM

## 2018-09-12 NOTE — Progress Notes (Signed)
PTNS  Session # 3  Health & Social Factors: No change Caffeine: 1 cup  Alcohol: none Daytime voids #per day: 7 Night-time voids #per night: 4 Urgency: strong Incontinence Episodes #per day: 0 Ankle used: Left Treatment Setting: 7 Feeling/ Response: both Comments: Patient tolerated well  Preformed By: Shawnie Dapper, CMA  Follow Up: as scheduled

## 2018-09-12 NOTE — Addendum Note (Signed)
Addended by: Feliberto Gottron on: 09/12/2018 02:11 PM   Modules accepted: Level of Service

## 2018-09-19 ENCOUNTER — Ambulatory Visit (INDEPENDENT_AMBULATORY_CARE_PROVIDER_SITE_OTHER): Payer: Medicare HMO

## 2018-09-19 ENCOUNTER — Other Ambulatory Visit: Payer: Self-pay

## 2018-09-19 DIAGNOSIS — R3915 Urgency of urination: Secondary | ICD-10-CM | POA: Diagnosis not present

## 2018-09-19 NOTE — Progress Notes (Signed)
PTNS  Session # 4  Health & Social Factors: no change Caffeine: 1 cup Alcohol: none Daytime voids #per day: 7 Night-time voids #per night: 4 Urgency: strong Incontinence Episodes #per day: 0 Ankle used: left Treatment Setting: 15 Feeling/ Response: sensory Comments: patient tolerated well  Preformed By: Shawnie Dapper, CMA  Follow Up: as scheduled

## 2018-09-25 ENCOUNTER — Other Ambulatory Visit: Payer: Self-pay

## 2018-09-25 ENCOUNTER — Ambulatory Visit (INDEPENDENT_AMBULATORY_CARE_PROVIDER_SITE_OTHER): Payer: Medicare HMO | Admitting: *Deleted

## 2018-09-25 DIAGNOSIS — R3915 Urgency of urination: Secondary | ICD-10-CM | POA: Diagnosis not present

## 2018-09-25 NOTE — Progress Notes (Signed)
PTNS  Session # 5  Health & Social Factors: no change Caffeine: 1 cup Alcohol: none Daytime voids #per day: 5 Night-time voids #per night: 3 Urgency: strong Incontinence Episodes #per day: 0 Ankle used: left Treatment Setting: 4 Feeling/ Response: sensory Comments: Tolerated well  Performed by: Evonnie Pat and Yun Gutierrez,CMA  Follow Up:As scheduled

## 2018-10-03 ENCOUNTER — Ambulatory Visit (INDEPENDENT_AMBULATORY_CARE_PROVIDER_SITE_OTHER): Payer: Medicare HMO

## 2018-10-03 ENCOUNTER — Other Ambulatory Visit: Payer: Self-pay

## 2018-10-03 DIAGNOSIS — R3915 Urgency of urination: Secondary | ICD-10-CM | POA: Diagnosis not present

## 2018-10-03 NOTE — Progress Notes (Signed)
  PTNS  Session # 6  Health & Social Factors: no change Caffeine: 1 cup Alcohol: 0 Daytime voids #per day: 5 Night-time voids #per night: 2 Urgency: strong Incontinence Episodes #per day: 0 Ankle used: right Treatment Setting: 4 Feeling/ Response: both Comments: Patient tolerated well.  Preformed By: Shawnie Dapper, CMA  Follow Up: 1 week

## 2018-10-10 ENCOUNTER — Ambulatory Visit: Payer: Medicare HMO

## 2018-10-17 ENCOUNTER — Ambulatory Visit (INDEPENDENT_AMBULATORY_CARE_PROVIDER_SITE_OTHER): Payer: Medicare HMO | Admitting: *Deleted

## 2018-10-17 ENCOUNTER — Other Ambulatory Visit: Payer: Self-pay

## 2018-10-17 DIAGNOSIS — R3915 Urgency of urination: Secondary | ICD-10-CM

## 2018-10-17 NOTE — Progress Notes (Signed)
PTNS  Session # 7  Health & Social Factors: no change Caffeine: 1 cup  Alcohol: 0 Daytime voids #per day: 5 Night-time voids #per night: 3 Urgency: Mild Incontinence Episodes #per day: 0 Ankle used: left Treatment Setting: 15 Feeling/ Response: Srong   Preformed By: Blondell Reveal   Follow Up: 1 week

## 2018-10-24 ENCOUNTER — Ambulatory Visit (INDEPENDENT_AMBULATORY_CARE_PROVIDER_SITE_OTHER): Payer: Medicare HMO

## 2018-10-24 ENCOUNTER — Other Ambulatory Visit: Payer: Self-pay

## 2018-10-24 DIAGNOSIS — R300 Dysuria: Secondary | ICD-10-CM

## 2018-10-24 DIAGNOSIS — R3915 Urgency of urination: Secondary | ICD-10-CM | POA: Diagnosis not present

## 2018-10-24 NOTE — Progress Notes (Signed)
PTNS  Session # 8  Health & Social Factors: no change Caffeine: 1 Alcohol: 0 Daytime voids #per day: 4 Night-time voids #per night: 2 Urgency: mild Incontinence Episodes #per day: 0 Ankle used: right Treatment Setting: 8 Feeling/ Response: both Comments: patient tolerated well  Preformed By: Shawnie Dapper, CMA  Follow Up: as scheduled

## 2018-10-31 ENCOUNTER — Other Ambulatory Visit: Payer: Self-pay

## 2018-10-31 ENCOUNTER — Ambulatory Visit (INDEPENDENT_AMBULATORY_CARE_PROVIDER_SITE_OTHER): Payer: Medicare HMO

## 2018-10-31 DIAGNOSIS — R3915 Urgency of urination: Secondary | ICD-10-CM | POA: Diagnosis not present

## 2018-10-31 NOTE — Progress Notes (Signed)
PTNS  Session # 9  Health & Social Factors: As been busy babysitting great grandchildren, has not been as diligent with voiding diary Caffeine: 1 Alcohol: 0 Daytime voids #per day: 5-6 Night-time voids #per night: 0 Urgency: Mild Incontinence Episodes #per day: 0 Ankle used: Left Treatment Setting: 7 Feeling/ Response: Sensory & Toe Flex Comments: N/A  Preformed By: Gordy Clement, CMA  Follow Up: RTC as scheduled

## 2018-11-07 ENCOUNTER — Ambulatory Visit (INDEPENDENT_AMBULATORY_CARE_PROVIDER_SITE_OTHER): Payer: Medicare HMO | Admitting: Physician Assistant

## 2018-11-07 ENCOUNTER — Other Ambulatory Visit: Payer: Self-pay

## 2018-11-07 DIAGNOSIS — R3915 Urgency of urination: Secondary | ICD-10-CM | POA: Diagnosis not present

## 2018-11-07 NOTE — Progress Notes (Signed)
PTNS  Session # 10  Health & Social Factors: no change Caffeine: 1 Alcohol: 0 Daytime voids #per day: 4 Night-time voids #per night: 2 Urgency: mild Incontinence Episodes #per day: 0 Ankle used: right Treatment Setting: 6 Feeling/ Response: both Comments: Patient tolerated well  Performed By: Debroah Loop, PA-C  Assistant: Fonnie Jarvis, CMA  Follow Up: 1 week for [id number]

## 2018-11-12 IMAGING — CT CT ABD-PELV W/ CM
2 of 5 series · 16 of 46 positions shown, 18 images · IV contrast (iopamidol)
Comparison: None.

CLINICAL DATA: Left lower abdominal pain since [place].  Nausea

EXAM:
CT ABDOMEN AND PELVIS WITH CONTRAST
TECHNIQUE: Multidetector CT imaging of the abdomen and pelvis was performed
using the standard protocol following bolus administration of
intravenous contrast.
CONTRAST:  80mL [id number] IOPAMIDOL ([id number]) INJECTION 61%

[Series 2: abd pelvis · axial · 0.70mm/px · z∈[-1546,-1141]mm · 13 of 91 slices shown, 15 images (1 of 2)]
[im 5/91  soft-tissue]
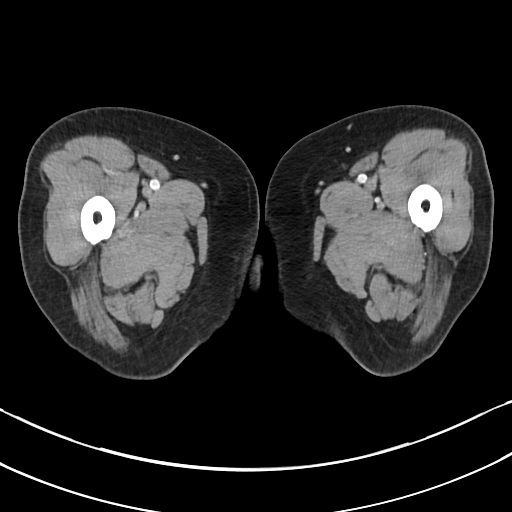
[im 5/91  bone]
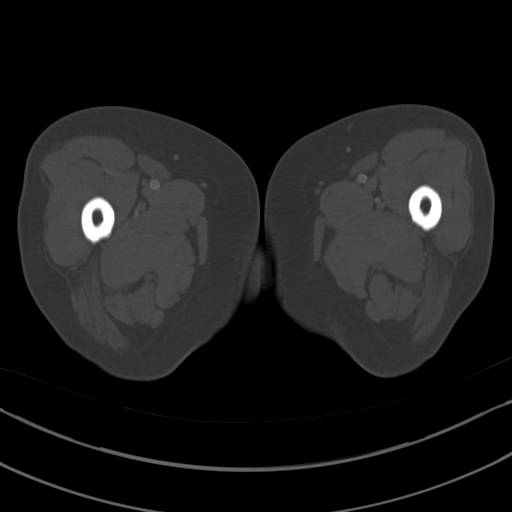
[im 14/91  soft-tissue]
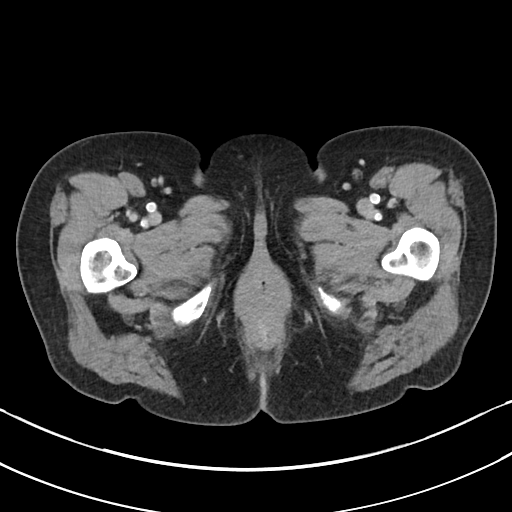
[im 19/91  soft-tissue]
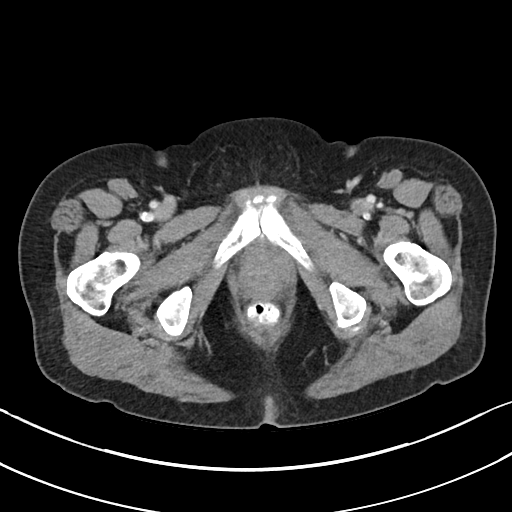
[im 28/91  soft-tissue]
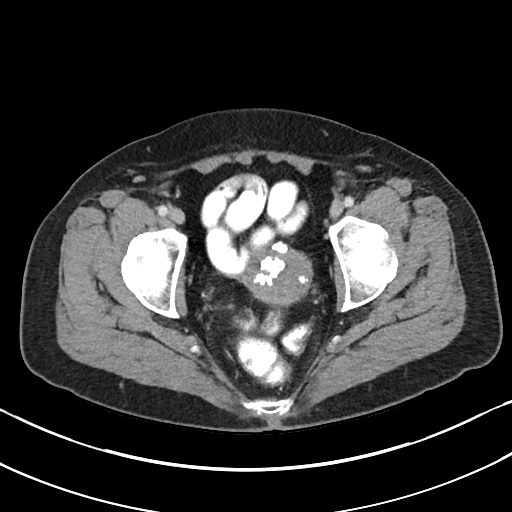
[im 32/91  soft-tissue]
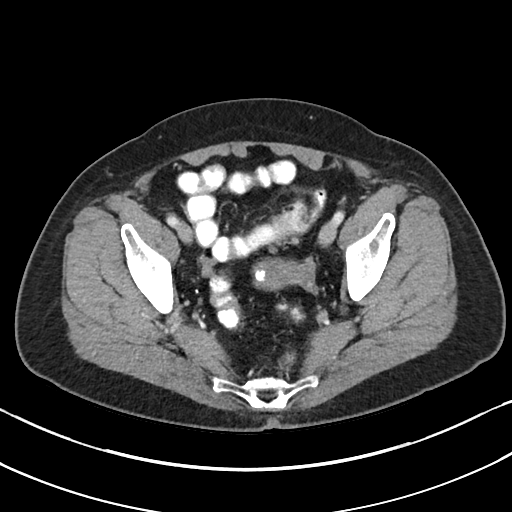
[im 41/91  soft-tissue]
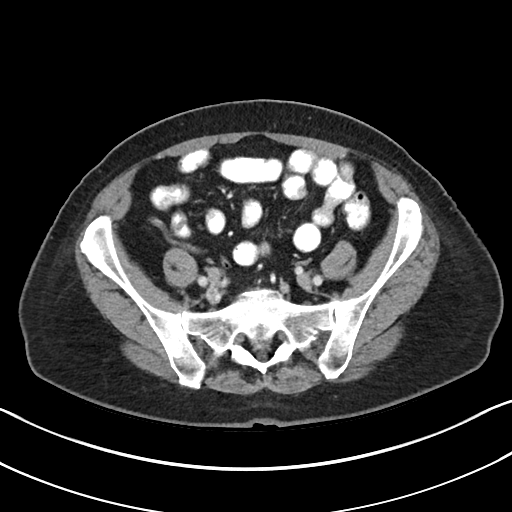
[im 46/91  soft-tissue]
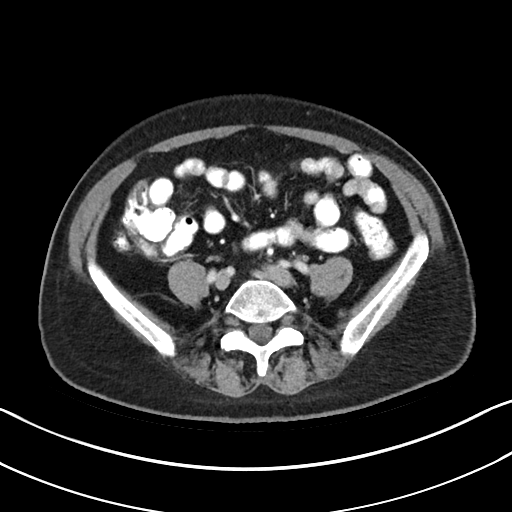
[im 50/91  soft-tissue]
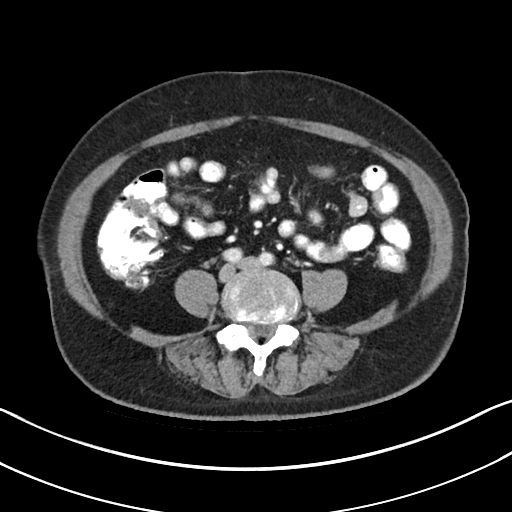
[im 59/91  soft-tissue]
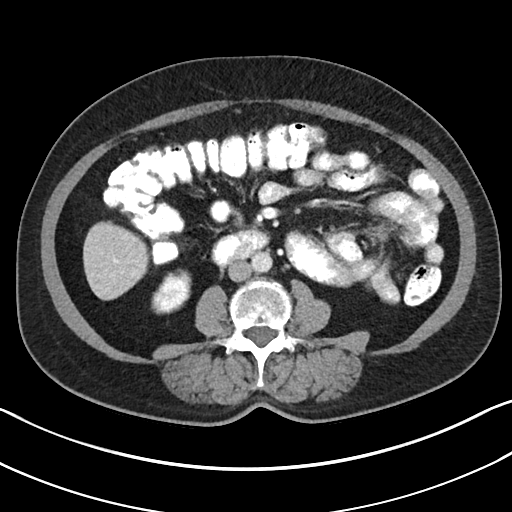
[im 59/91  bone]
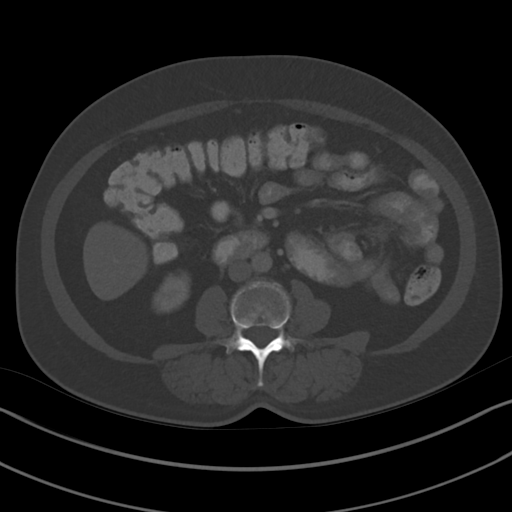
[im 64/91  soft-tissue]
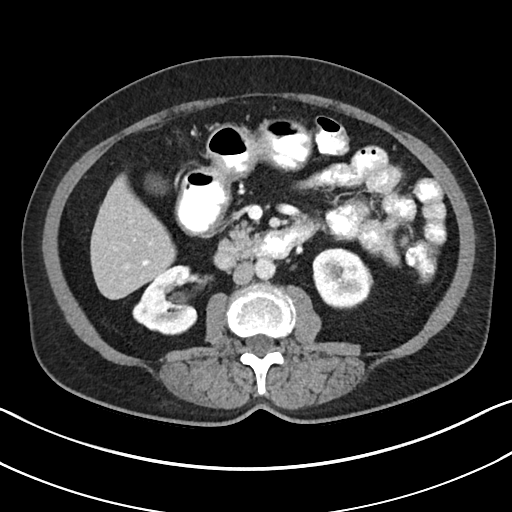
[im 73/91  soft-tissue]
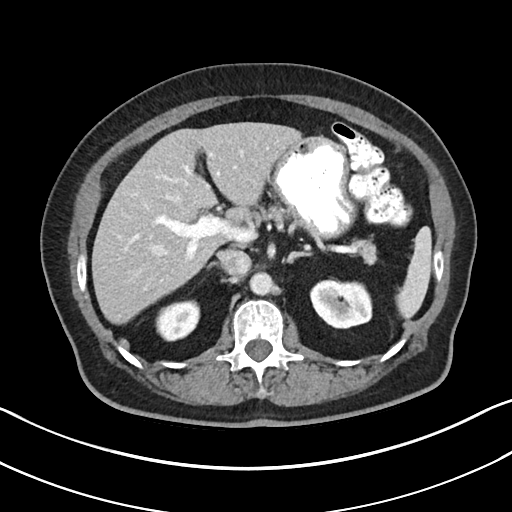
[im 77/91  soft-tissue]
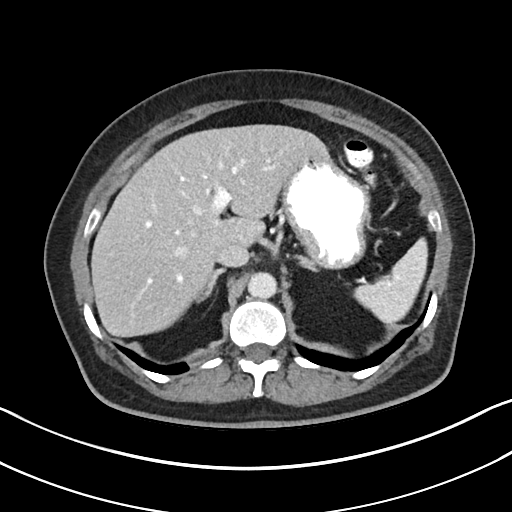
[im 86/91  soft-tissue]
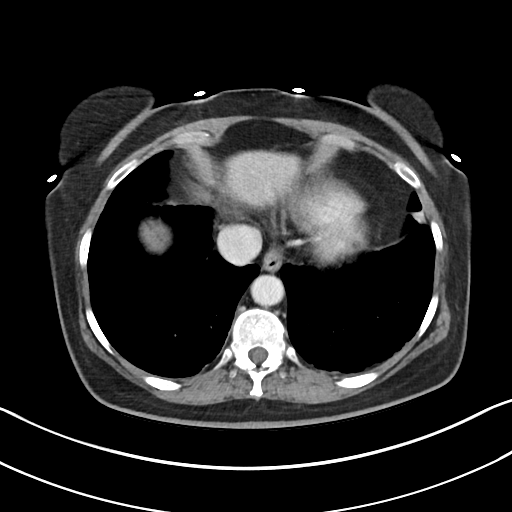

[Series 4: abd pelvis · coronal · 0.69mm/px · 3 of 133 slices shown (2 of 2)]
[im 45/133  soft-tissue]
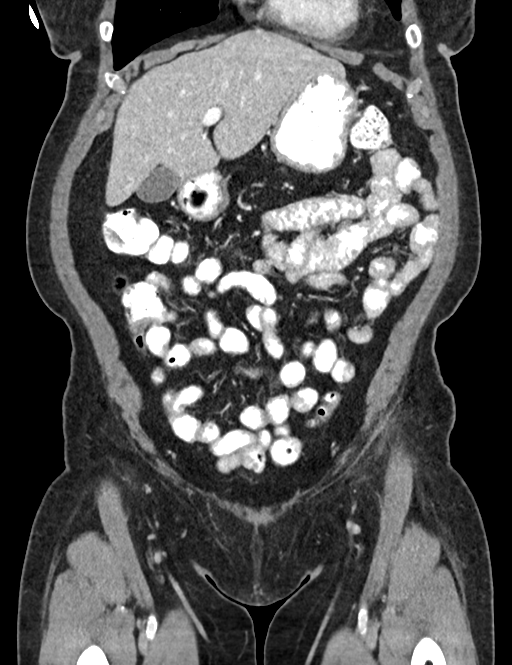
[im 59/133  soft-tissue]
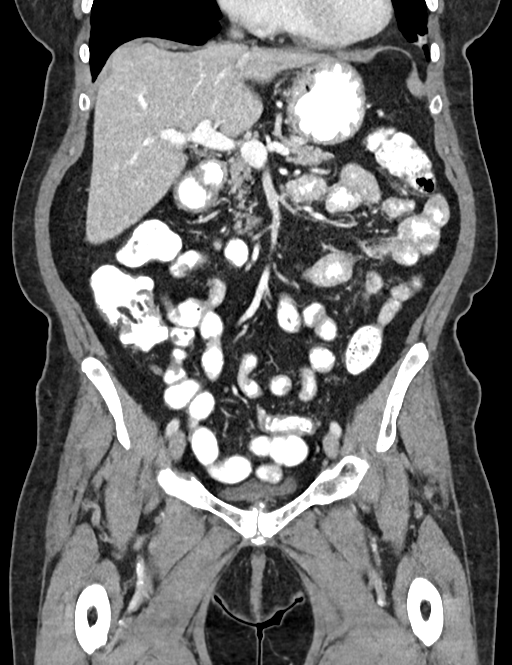
[im 74/133  soft-tissue]
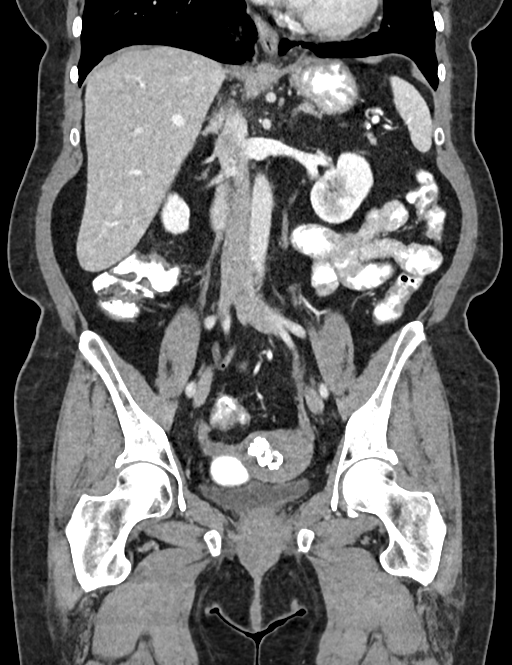

[16 of 46 positions shown; findings below may reference images not displayed]

FINDINGS: Lower chest:  Negative.

Hepatobiliary: No focal liver abnormality.No evidence of biliary
obstruction or stone.

Pancreas: Unremarkable.

Spleen: Unremarkable.

Adrenals/Urinary Tract: Negative adrenals. No hydronephrosis or
stone. Unremarkable bladder.

Stomach/Bowel: No obstruction. No appendicitis. Mild distal colonic
diverticulosis.

Vascular/Lymphatic: No acute vascular abnormality. Mild
atherosclerotic calcification, especially for age. There is
bilateral SFA calcified plaque. No mass or adenopathy.

Reproductive:Coarse calcifications in the uterus consistent with
hyalinized fibroids.

Other: No ascites or pneumoperitoneum.

Musculoskeletal: No acute abnormalities.
IMPRESSION: 1. No specific explanation for symptoms.
2. Mild distal colonic diverticulosis.
3.  Aortic Atherosclerosis ([id number]), mild

## 2018-11-14 ENCOUNTER — Ambulatory Visit (INDEPENDENT_AMBULATORY_CARE_PROVIDER_SITE_OTHER): Payer: Medicare HMO | Admitting: *Deleted

## 2018-11-14 ENCOUNTER — Other Ambulatory Visit: Payer: Self-pay

## 2018-11-14 DIAGNOSIS — R3915 Urgency of urination: Secondary | ICD-10-CM

## 2018-11-14 NOTE — Progress Notes (Signed)
PTNS  Session # 11  Health & Social Factors: No change Caffeine: 1 Alcohol: 0 Daytime voids #per day: 4 Night-time voids #per night: 2 Urgency: Mild Incontinence Episodes #per day: 0 Ankle used: Right Treatment Setting: 18 Feeling/ Response: Both Comments: Tolerated well  Performed By: Verlene Mayer, CMA  Follow Up: One week

## 2018-11-21 ENCOUNTER — Ambulatory Visit (INDEPENDENT_AMBULATORY_CARE_PROVIDER_SITE_OTHER): Payer: Medicare HMO | Admitting: *Deleted

## 2018-11-21 ENCOUNTER — Other Ambulatory Visit: Payer: Self-pay

## 2018-11-21 DIAGNOSIS — R3915 Urgency of urination: Secondary | ICD-10-CM | POA: Diagnosis not present

## 2018-11-21 NOTE — Progress Notes (Signed)
PTNS  Session # 12  Health & Social Factors: No change Caffeine: 1 Alcohol: 0 Daytime voids #per day: 5 Night-time voids #per night: 2-3 Urgency: mild Incontinence Episodes #per day 0 Ankle used: Right Treatment Setting: 19 Feeling/ Response: Both   Performed [id number] Morris,CMA  Follow Up: One month

## 2018-11-28 ENCOUNTER — Other Ambulatory Visit: Payer: Self-pay | Admitting: Internal Medicine

## 2018-11-28 DIAGNOSIS — Z1231 Encounter for screening mammogram for malignant neoplasm of breast: Secondary | ICD-10-CM

## 2018-12-12 ENCOUNTER — Encounter: Payer: Self-pay | Admitting: Urology

## 2018-12-12 ENCOUNTER — Ambulatory Visit: Payer: Medicare HMO | Admitting: Urology

## 2019-01-21 ENCOUNTER — Ambulatory Visit: Payer: Medicare HMO

## 2019-01-22 NOTE — Progress Notes (Signed)
04/25/2018  9:25 PM   Jerrye Bushy [birth year] [id number]  Referring provider: Leone Haven, MD [address] [place],  [place] [address]  Chief Complaint  Patient presents with  . Follow-up    HPI: Dionte Preece is a 78 y.o. female who presents for follow up after completing 12 weeks of PTNS.    She has a history of a urethral diverticulum that was surgically treated in 1984.    Contrast CT in 08/2017 by GI for left lower quadrant pain and nausea revealed negative adrenals. No hydronephrosis or stone. Unremarkable bladder.    Cytoscopy on 12/31/2017 was unremarkable, with no evidence of UTI and no concern for underlying anatomic issues involving the bladder.  She has completed 12 weekly treatments of PTNS.    On today's visit (01/23/2019) the patient is experiencing urgency x 4-7 (improved), frequency x 4-7 (improved), is restricting fluids to avoid visits to the restroom 4-7, is engaging in toilet mapping, incontinence x 0-3 (stable) and nocturia x 4-7 (stable).   Her BP is 107/72.   Her PVR is 16 mL.    Patient reports that the Toviaz caused memory loss.  Myrbetriq could not be used due contraindications with nortriptyline.  She feels that the PTNS has improved her urinary symptoms.  She would like to continue.  PMH: Past Medical History:  Diagnosis Date  . Asthma   . Chicken pox   . Colon polyps   . Diabetes mellitus without complication ([place])   . Diverticulosis   . Hyperlipidemia   . Hypertension     Surgical History: Past Surgical History:  Procedure Laterality Date  . BLADDER SURGERY    . COLONOSCOPY WITH PROPOFOL N/A 09/25/2017   Procedure: COLONOSCOPY WITH PROPOFOL;  Surgeon: Toledo, Benay Pike, MD;  Location: [facility] ENDOSCOPY;  Service: Gastroenterology;  Laterality: N/A;  . ESOPHAGOGASTRODUODENOSCOPY (EGD) WITH PROPOFOL N/A 09/25/2017   Procedure: ESOPHAGOGASTRODUODENOSCOPY (EGD) WITH PROPOFOL;  Surgeon: Toledo, Benay Pike, MD;  Location:  [facility] ENDOSCOPY;  Service: Gastroenterology;  Laterality: N/A;  . URETHRAL DIVERTICULUM REPAIR  1980    Home Medications:  Allergies as of 01/23/2019      Reactions   Toviaz [fesoterodine Fumarate Er] Other (See Comments)   Memory loss   Januvia [sitagliptin] Rash      Medication List       Accurate as of January 23, 2019 11:59 PM. If you have any questions, ask your nurse or doctor.        albuterol (2.5 MG/3ML) 0.083% nebulizer solution Commonly known as: PROVENTIL Take 3 mLs (2.5 mg total) by nebulization every 6 (six) hours as needed for wheezing or shortness of breath.   albuterol 108 (90 Base) MCG/ACT inhaler Commonly known as: VENTOLIN HFA INHALE ONE TO TWO PUFFS BY MOUTH EVERY 6 HOURS AS NEEDED FOR WHEEZING OR  SHORTNESS  OF  BREATH   amLODipine 5 MG tablet Commonly known as: NORVASC TAKE 1 TABLET BY MOUTH ONCE DAILY   atorvastatin 40 MG tablet Commonly known as: LIPITOR Take 1 tablet (40 mg total) by mouth daily.   beclomethasone 80 MCG/ACT inhaler Commonly known as: QVAR Inhale 1 puff into the lungs 2 (two) times daily.   conjugated estrogens vaginal cream Commonly known as: Premarin Apply 0.5mg  (pea-sized amount)  just inside the vaginal introitus with a finger-tip on  Monday, Wednesday and Friday nights.   estradiol 0.1 MG/GM vaginal cream Commonly known as: ESTRACE VAGINAL Apply 0.5mg  (pea-sized amount)  just inside the vaginal introitus  with a finger-tip on Monday, Wednesday and Friday nights.   fesoterodine 4 MG Tb24 tablet Commonly known as: TOVIAZ Take 1 tablet (4 mg total) by mouth daily.   glyBURIDE 5 MG tablet Commonly known as: DIABETA Take 1 tablet (5 mg total) by mouth 2 (two) times daily.   lisinopril 20 MG tablet Commonly known as: ZESTRIL TAKE 1 TABLET BY MOUTH ONCE DAILY   metFORMIN 500 MG 24 hr tablet Commonly known as: GLUCOPHAGE-XR Take 1,000 mg by mouth daily after supper.   nortriptyline 25 MG capsule Commonly known as:  PAMELOR Take 25 mg by mouth.   pioglitazone 15 MG tablet Commonly known as: ACTOS Take 15 mg by mouth daily.   Qvar RediHaler 80 MCG/ACT inhaler Generic drug: beclomethasone Inhale 1 puff into the lungs 2 (two) times daily.   Semaglutide(0.25 or 0.5MG /DOS) 2 MG/1.5ML Sopn Inject into the skin.       Allergies:  Allergies  Allergen Reactions  . Toviaz [Fesoterodine Fumarate Er] Other (See Comments)    Memory loss  . Januvia [Sitagliptin] Rash    Family History: Family History  Problem Relation Age of Onset  . Cancer Father        prostate  . Diabetes Sister   . Hypertension Sister   . AAA (abdominal aortic aneurysm) Brother   . Diabetes Sister   . Hypertension Sister   . Diabetes Sister   . Hypertension Sister   . Diabetes Sister   . Hypertension Sister   . Hypertension Mother   . Heart disease Mother   . Diabetes Mother   . Breast cancer Neg Hx     Social History:  reports that she has never smoked. She has never used smokeless tobacco. She reports that she does not drink alcohol or use drugs.  ROS: UROLOGY Frequent Urination?: No Hard to postpone urination?: No Burning/pain with urination?: No Get up at night to urinate?: No Leakage of urine?: No Urine stream starts and stops?: No Trouble starting stream?: No Do you have to strain to urinate?: No Blood in urine?: No Urinary tract infection?: No Sexually transmitted disease?: No Injury to kidneys or bladder?: No Painful intercourse?: No Weak stream?: No Currently pregnant?: No Vaginal bleeding?: No Last menstrual period?: n  Gastrointestinal Nausea?: No Vomiting?: No Indigestion/heartburn?: No Diarrhea?: No Constipation?: No  Constitutional Fever: No Night sweats?: No Weight loss?: No Fatigue?: No  Skin Skin rash/lesions?: No Itching?: No  Eyes Blurred vision?: No Double vision?: No  Ears/Nose/Throat Sore throat?: No Sinus problems?: No  Hematologic/Lymphatic Swollen  glands?: No Easy bruising?: No  Cardiovascular Leg swelling?: No Chest pain?: No  Respiratory Cough?: No Shortness of breath?: No  Endocrine Excessive thirst?: No  Musculoskeletal Back pain?: No Joint pain?: No  Neurological Headaches?: No Dizziness?: No  Psychologic Depression?: No Anxiety?: No  Physical Exam: BP 107/72   Pulse 88   Ht 5' (1.524 m)   Wt 135 lb (61.2 kg)   BMI 26.37 kg/m   Constitutional:  Well nourished. Alert and oriented, No acute distress. HEENT: [place] AT, moist mucus membranes.  Trachea midline, no masses. Cardiovascular: No clubbing, cyanosis, or edema. Respiratory: Normal respiratory effort, no increased work of breathing. Neurologic: Grossly intact, no focal deficits, moving all 4 extremities. Psychiatric: Normal mood and affect.   Laboratory Data:  Lab Results  Component Value Date   CREATININE 1.20 (H) 04/11/2018   PVR Results for TEMPESTT, FAGERSTROM (MRN [id number]) as of 01/23/2019 11:18  Ref. Range 01/23/2019 11:13  Scan  Result Unknown 31mL    I have reviewed the labs.  Assessment & Plan:    1. Urgency - Patient was unable to tolerate Toviaz due to side effects - Myrbetriq could not be used due contraindications with nortriptyline - Feels PTNS has improved her urinary frequency  2. History of urethral diverticulum -  Cystoscopy underwent at 12/31/2017, NED.  3. Vaginal atrophy - Continue vaginal estrogen cream (Premarin vaginal cream) Monday, Wednesday and Friday nights.  Return in about 1 month (around 02/23/2019) for Maintenance PTNS.

## 2019-01-23 ENCOUNTER — Encounter: Payer: Self-pay | Admitting: Urology

## 2019-01-23 ENCOUNTER — Ambulatory Visit (INDEPENDENT_AMBULATORY_CARE_PROVIDER_SITE_OTHER): Payer: Medicare HMO | Admitting: Urology

## 2019-01-23 ENCOUNTER — Other Ambulatory Visit: Payer: Self-pay

## 2019-01-23 VITALS — BP 107/72 | HR 88 | Ht 60.0 in | Wt 135.0 lb

## 2019-01-23 DIAGNOSIS — R102 Pelvic and perineal pain: Secondary | ICD-10-CM

## 2019-01-23 DIAGNOSIS — N952 Postmenopausal atrophic vaginitis: Secondary | ICD-10-CM

## 2019-01-23 DIAGNOSIS — R3915 Urgency of urination: Secondary | ICD-10-CM

## 2019-01-23 DIAGNOSIS — Z86018 Personal history of other benign neoplasm: Secondary | ICD-10-CM | POA: Diagnosis not present

## 2019-01-23 LAB — BLADDER SCAN AMB NON-IMAGING

## 2019-01-27 ENCOUNTER — Other Ambulatory Visit: Payer: Self-pay | Admitting: Internal Medicine

## 2019-01-27 DIAGNOSIS — N644 Mastodynia: Secondary | ICD-10-CM

## 2019-01-29 ENCOUNTER — Ambulatory Visit (INDEPENDENT_AMBULATORY_CARE_PROVIDER_SITE_OTHER): Payer: Medicare HMO | Admitting: Obstetrics and Gynecology

## 2019-01-29 ENCOUNTER — Other Ambulatory Visit: Payer: Self-pay

## 2019-01-29 ENCOUNTER — Encounter: Payer: Self-pay | Admitting: Obstetrics and Gynecology

## 2019-01-29 VITALS — BP 129/74 | HR 89 | Ht 60.0 in | Wt 135.9 lb

## 2019-01-29 DIAGNOSIS — N952 Postmenopausal atrophic vaginitis: Secondary | ICD-10-CM

## 2019-01-29 DIAGNOSIS — R3915 Urgency of urination: Secondary | ICD-10-CM | POA: Diagnosis not present

## 2019-01-29 NOTE — Progress Notes (Signed)
Patient comes in today for NP appointment. She was sent by urology for vaginal burning and dryness.

## 2019-01-29 NOTE — Progress Notes (Signed)
HPI:      [name] is a 78 y.o. G1P1 who LMP was No LMP recorded. Patient is postmenopausal.  Subjective:   She presents today referred from urology because she has been experiencing urgency of urination and bladder burning pain.  She has undergone a fairly extensive work-up and no significant abnormalities of the urinary tract were noted. Upon questioning today she has no vaginal or vulvar symptoms.  She was previously prescribed estrogen vaginal cream but is not using it.  She reports no itching or burning of the labia vulva or vagina.      Hx: The following portions of the patient's history were reviewed and updated as appropriate:             She  has a past medical history of Asthma, Chicken pox, Colon polyps, Diabetes mellitus without complication ([place]), Diverticulosis, Hyperlipidemia, and Hypertension. She does not have any pertinent problems on file. She  has a past surgical history that includes Bladder surgery; Esophagogastroduodenoscopy (egd) with propofol (N/A, 09/25/2017); Colonoscopy with propofol (N/A, 09/25/2017); and Urethral diverticulum repair (1980). Her family history includes AAA (abdominal aortic aneurysm) in her brother; Cancer in her father; Diabetes in her mother, sister, sister, sister, and sister; Heart disease in her mother; Hypertension in her mother, sister, sister, sister, and sister. She  reports that she has never smoked. She has never used smokeless tobacco. She reports that she does not drink alcohol or use drugs. She has a current medication list which includes the following prescription(s): albuterol, albuterol, amlodipine, atorvastatin, beclomethasone, fesoterodine, glyburide, lisinopril, metformin, pioglitazone, qvar redihaler, semaglutide(0.25 or 0.5mg /dos), conjugated estrogens, estradiol, and nortriptyline. She is allergic to [place] [fesoterodine fumarate er] and [place] [sitagliptin].       Review of Systems:  Review of Systems  Constitutional:  Denied constitutional symptoms, night sweats, recent illness, fatigue, fever, insomnia and weight loss.  Eyes: Denied eye symptoms, eye pain, photophobia, vision change and visual disturbance.  Ears/Nose/Throat/Neck: Denied ear, nose, throat or neck symptoms, hearing loss, nasal discharge, sinus congestion and sore throat.  Cardiovascular: Denied cardiovascular symptoms, arrhythmia, chest pain/pressure, edema, exercise intolerance, orthopnea and palpitations.  Respiratory: Denied pulmonary symptoms, asthma, pleuritic pain, productive sputum, cough, dyspnea and wheezing.  Gastrointestinal: Denied, gastro-esophageal reflux, melena, nausea and vomiting.  Genitourinary: See HPI for additional information.  Musculoskeletal: Denied musculoskeletal symptoms, stiffness, swelling, muscle weakness and myalgia.  Dermatologic: Denied dermatology symptoms, rash and scar.  Neurologic: Denied neurology symptoms, dizziness, headache, neck pain and syncope.  Psychiatric: Denied psychiatric symptoms, anxiety and depression.  Endocrine: Denied endocrine symptoms including hot flashes and night sweats.   Meds:   Current Outpatient Medications on File Prior to Visit  Medication Sig Dispense Refill  . albuterol (PROVENTIL HFA;VENTOLIN HFA) 108 (90 Base) MCG/ACT inhaler INHALE ONE TO TWO PUFFS BY MOUTH EVERY 6 HOURS AS NEEDED FOR WHEEZING OR  SHORTNESS  OF  BREATH 18 each 6  . albuterol (PROVENTIL) (2.5 MG/3ML) 0.083% nebulizer solution Take 3 mLs (2.5 mg total) by nebulization every 6 (six) hours as needed for wheezing or shortness of breath. 150 mL 1  . amLODipine (NORVASC) 5 MG tablet TAKE 1 TABLET BY MOUTH ONCE DAILY 90 tablet 0  . atorvastatin (LIPITOR) 40 MG tablet Take 1 tablet (40 mg total) by mouth daily. 90 tablet 3  . beclomethasone (QVAR) 80 MCG/ACT inhaler Inhale 1 puff into the lungs 2 (two) times daily. 1 Inhaler 12  . fesoterodine (TOVIAZ) 4 MG TB24 tablet Take 1 tablet (4 mg  total) by mouth daily. 28  tablet 0  . glyBURIDE (DIABETA) 5 MG tablet Take 1 tablet (5 mg total) by mouth 2 (two) times daily. 180 tablet 3  . lisinopril (PRINIVIL,ZESTRIL) 20 MG tablet TAKE 1 TABLET BY MOUTH ONCE DAILY 90 tablet 0  . metFORMIN (GLUCOPHAGE-XR) 500 MG 24 hr tablet Take 1,000 mg by mouth daily after supper.     . pioglitazone (ACTOS) 15 MG tablet Take 15 mg by mouth daily.    Marland Kitchen QVAR REDIHALER 80 MCG/ACT inhaler Inhale 1 puff into the lungs 2 (two) times daily.    . Semaglutide,0.25 or 0.5MG /DOS, 2 MG/1.5ML SOPN Inject into the skin.    Marland Kitchen conjugated estrogens (PREMARIN) vaginal cream Apply 0.5mg  (pea-sized amount)  just inside the vaginal introitus with a finger-tip on  Monday, Wednesday and Friday nights. (Patient not taking: Reported on 01/29/2019) 30 g 12  . estradiol (ESTRACE VAGINAL) 0.1 MG/GM vaginal cream Apply 0.5mg  (pea-sized amount)  just inside the vaginal introitus with a finger-tip on Monday, Wednesday and Friday nights. (Patient not taking: Reported on 01/29/2019) 30 g 12  . nortriptyline (PAMELOR) 25 MG capsule Take 25 mg by mouth.     No current facility-administered medications on file prior to visit.     Objective:     Vitals:   01/29/19 1021  BP: 129/74  Pulse: 89              Physical examination   Pelvic:   Vulva: Normal appearance.  No lesions.  Atrophic  Vagina: No lesions or abnormalities noted.  Atrophic vaginitis  Support: Normal pelvic support.  No significant rectocele or cystocele  Urethra No masses tenderness or scarring.  Meatus Normal size without lesions or prolapse.  Cervix: Normal appearance.  No lesions.  Anus: Normal exam.  No lesions.  Perineum: Normal exam.  No lesions.        Bimanual   Uterus: Normal size.  Non-tender.  Mobile.  AV.  Adnexae: No masses.  Non-tender to palpation.  Cul-de-sac: Negative for abnormality.   Patient has a significant midline pelvic pain in the area of the bladder.  This pain is very localized to the upper anterior vagina  with the internal hand midline suprapubically with the external hand.  It does not localize to the uterine body.   Assessment:    G1P1 Patient Active Problem List   Diagnosis Date Noted  . Perimenopausal atrophic vaginitis 04/24/2018  . Pure hypercholesterolemia 04/24/2018  . Atypical chest pain 10/18/2017  . Excessive cerumen in both ear canals 10/18/2017  . Tinnitus of both ears 07/19/2017  . History of colon polyps 01/18/2017  . Epigastric pain 04/06/2016  . CKD (chronic kidney disease) stage 3, GFR 30-59 ml/min 06/20/2015  . Constipation 06/20/2015  . Essential hypertension 06/19/2015  . Hyperlipidemia 04/18/2015  . DM (diabetes mellitus), type 2, uncontrolled, with renal complications ([place]) [id number]  . Asthma 04/18/2015     1. Urgency of urination   2. Atrophic vulvovaginitis     I believe her pain localizes to the bladder area.  No significant abnormalities are noted in the vagina with no evidence of vaginal support issues.  Her atrophy remains but she is disinclined to use the cream.   Plan:            1.  I have discussed all the above with her in detail and have recommended that she continue her work-up with urology. Orders No orders of the defined types were placed in this  encounter.   No orders of the defined types were placed in this encounter.     F/U  Return for Pt to contact us if symptoms worsen. I spent 22 minutes involved in the care of this patient of which greater than 50% was spent discussing pain, urinary urgency, vaginal atrophy, use of estrogen, continued possible work-up.  All questions were answered.  Finis Bud, M.D. 01/29/2019 10:50 AM

## 2019-02-06 ENCOUNTER — Ambulatory Visit
Admission: RE | Admit: 2019-02-06 | Discharge: 2019-02-06 | Disposition: A | Discharge status: Home / Self Care | Payer: Medicare HMO | Source: Ambulatory Visit | Attending: Internal Medicine | Admitting: Internal Medicine

## 2019-02-06 DIAGNOSIS — N644 Mastodynia: Secondary | ICD-10-CM

## 2019-02-26 NOTE — Progress Notes (Deleted)
PTNS  Session # ***  Health & Social Factors: *** Caffeine: *** Alcohol: *** Daytime voids #per day: *** Night-time voids #per night: *** Urgency: *** Incontinence Episodes #per day: *** Ankle used: *** Treatment Setting: *** Feeling/ Response: *** Comments: ***  Preformed By: ***  Assistant: ***  Follow Up: ***  

## 2019-02-27 ENCOUNTER — Ambulatory Visit: Payer: Medicare HMO | Admitting: Urology

## 2019-05-08 ENCOUNTER — Ambulatory Visit: Payer: Medicare HMO | Attending: Internal Medicine

## 2019-06-25 ENCOUNTER — Ambulatory Visit: Payer: Medicare HMO | Attending: Internal Medicine

## 2019-06-25 ENCOUNTER — Other Ambulatory Visit: Payer: Self-pay

## 2019-06-25 DIAGNOSIS — Z23 Encounter for immunization: Secondary | ICD-10-CM

## 2019-06-25 NOTE — Progress Notes (Signed)
   Covid-19 Vaccination Clinic  Name:  Shanquita Mcneese    MRN: [id number] DOB: Oct 08, 1940  06/25/2019  Ms. Roller was observed post Covid-19 immunization for 15 minutes without incident. She was provided with Vaccine Information Sheet and instruction to access the V-Safe system.   Ms. Mahn was instructed to call 911 with any severe reactions post vaccine: Marland Kitchen Difficulty breathing  . Swelling of face and throat  . A fast heartbeat  . A bad rash all over body  . Dizziness and weakness   Immunizations Administered    Name Date Dose VIS Date Route   Pfizer COVID-19 Vaccine 06/25/2019  9:47 AM 0.3 mL 03/06/2019 Intramuscular   Manufacturer: [place]   Lot: [id number]   [place]: [id number]

## 2019-07-22 ENCOUNTER — Ambulatory Visit: Payer: Medicare HMO | Attending: Internal Medicine

## 2019-07-22 DIAGNOSIS — Z23 Encounter for immunization: Secondary | ICD-10-CM

## 2019-07-22 NOTE — Progress Notes (Signed)
   Covid-19 Vaccination Clinic  Name:  Amaryah Bachmeier    MRN: [id number] DOB: [birth year]  07/22/2019  Ms. Glidden was observed post Covid-19 immunization for 15 minutes without incident. She was provided with Vaccine Information Sheet and instruction to access the V-Safe system.   Ms. Ringel was instructed to call 911 with any severe reactions post vaccine: Marland Kitchen Difficulty breathing  . Swelling of face and throat  . A fast heartbeat  . A bad rash all over body  . Dizziness and weakness   Immunizations Administered    Name Date Dose VIS Date Route   Pfizer COVID-19 Vaccine 07/22/2019  8:22 AM 0.3 mL 05/20/2018 Intramuscular   Manufacturer: [place]   Lot: [id number]   [place]: [id number]

## 2019-08-18 ENCOUNTER — Telehealth: Payer: Self-pay | Admitting: Family Medicine

## 2019-08-18 NOTE — Telephone Encounter (Signed)
Left message for patient to call back and schedule Medicare Annual Wellness Visit (AWV) either virtually or audio only.  Last AWV 05/03/17; please schedule at anytime with Denisa O'Brien-Blaney at [facility].

## 2019-11-06 LAB — HM DIABETES EYE EXAM

## 2019-11-26 ENCOUNTER — Other Ambulatory Visit: Payer: Self-pay | Admitting: Internal Medicine

## 2019-11-26 DIAGNOSIS — R102 Pelvic and perineal pain: Secondary | ICD-10-CM

## 2019-12-11 ENCOUNTER — Ambulatory Visit: Payer: Medicare HMO

## 2019-12-18 ENCOUNTER — Ambulatory Visit
Admission: RE | Admit: 2019-12-18 | Discharge: 2019-12-18 | Disposition: A | Discharge status: Home / Self Care | Payer: Medicare HMO | Source: Ambulatory Visit | Attending: Internal Medicine | Admitting: Internal Medicine

## 2019-12-18 ENCOUNTER — Other Ambulatory Visit: Payer: Self-pay

## 2019-12-18 DIAGNOSIS — R102 Pelvic and perineal pain: Secondary | ICD-10-CM | POA: Insufficient documentation

## 2019-12-18 LAB — POCT I-STAT CREATININE: Creatinine, Ser: 1.1 mg/dL — ABNORMAL HIGH (ref 0.44–1.00)

## 2019-12-18 MED ORDER — IOHEXOL 300 MG/ML  SOLN
85.0000 mL | Freq: Once | INTRAMUSCULAR | Status: AC | PRN
  Administered 2019-12-18: 85 mL via INTRAVENOUS

## 2020-01-20 DIAGNOSIS — M8580 Other specified disorders of bone density and structure, unspecified site: Secondary | ICD-10-CM | POA: Insufficient documentation

## 2020-01-22 ENCOUNTER — Other Ambulatory Visit: Payer: Self-pay | Admitting: Internal Medicine

## 2020-01-22 DIAGNOSIS — Z1231 Encounter for screening mammogram for malignant neoplasm of breast: Secondary | ICD-10-CM

## 2020-03-04 ENCOUNTER — Other Ambulatory Visit: Payer: Self-pay

## 2020-03-04 ENCOUNTER — Ambulatory Visit
Admission: RE | Admit: 2020-03-04 | Discharge: 2020-03-04 | Disposition: A | Discharge status: Home / Self Care | Payer: Medicare HMO | Source: Ambulatory Visit | Attending: Internal Medicine | Admitting: Internal Medicine

## 2020-03-04 DIAGNOSIS — Z1231 Encounter for screening mammogram for malignant neoplasm of breast: Secondary | ICD-10-CM | POA: Insufficient documentation

## 2020-05-24 ENCOUNTER — Other Ambulatory Visit: Payer: Self-pay | Admitting: Internal Medicine

## 2020-05-24 DIAGNOSIS — R202 Paresthesia of skin: Secondary | ICD-10-CM

## 2020-05-24 DIAGNOSIS — E1122 Type 2 diabetes mellitus with diabetic chronic kidney disease: Secondary | ICD-10-CM

## 2020-05-24 DIAGNOSIS — N1832 Chronic kidney disease, stage 3b: Secondary | ICD-10-CM

## 2020-05-27 ENCOUNTER — Other Ambulatory Visit: Payer: Self-pay

## 2020-05-27 ENCOUNTER — Ambulatory Visit
Admission: RE | Admit: 2020-05-27 | Discharge: 2020-05-27 | Disposition: A | Discharge status: Home / Self Care | Payer: Medicare HMO | Source: Ambulatory Visit | Attending: Internal Medicine | Admitting: Internal Medicine

## 2020-05-27 DIAGNOSIS — R202 Paresthesia of skin: Secondary | ICD-10-CM | POA: Diagnosis present

## 2020-05-27 DIAGNOSIS — N1832 Chronic kidney disease, stage 3b: Secondary | ICD-10-CM | POA: Diagnosis present

## 2020-05-27 DIAGNOSIS — E1122 Type 2 diabetes mellitus with diabetic chronic kidney disease: Secondary | ICD-10-CM | POA: Diagnosis present

## 2020-11-10 ENCOUNTER — Encounter: Payer: Self-pay | Admitting: Emergency Medicine

## 2020-11-10 ENCOUNTER — Observation Stay
Admission: EM | Admit: 2020-11-10 | Discharge: 2020-11-11 | Disposition: A | Discharge status: Home / Self Care | Payer: Medicare HMO | Attending: Obstetrics and Gynecology | Admitting: Obstetrics and Gynecology

## 2020-11-10 ENCOUNTER — Observation Stay: Payer: Medicare HMO

## 2020-11-10 ENCOUNTER — Emergency Department: Payer: Medicare HMO

## 2020-11-10 DIAGNOSIS — Z7984 Long term (current) use of oral hypoglycemic drugs: Secondary | ICD-10-CM | POA: Insufficient documentation

## 2020-11-10 DIAGNOSIS — E039 Hypothyroidism, unspecified: Secondary | ICD-10-CM | POA: Diagnosis not present

## 2020-11-10 DIAGNOSIS — G459 Transient cerebral ischemic attack, unspecified: Secondary | ICD-10-CM | POA: Diagnosis not present

## 2020-11-10 DIAGNOSIS — J45909 Unspecified asthma, uncomplicated: Secondary | ICD-10-CM | POA: Insufficient documentation

## 2020-11-10 DIAGNOSIS — Z8673 Personal history of transient ischemic attack (TIA), and cerebral infarction without residual deficits: Secondary | ICD-10-CM | POA: Diagnosis not present

## 2020-11-10 DIAGNOSIS — Z79899 Other long term (current) drug therapy: Secondary | ICD-10-CM | POA: Insufficient documentation

## 2020-11-10 DIAGNOSIS — E1122 Type 2 diabetes mellitus with diabetic chronic kidney disease: Secondary | ICD-10-CM | POA: Insufficient documentation

## 2020-11-10 DIAGNOSIS — N1832 Chronic kidney disease, stage 3b: Secondary | ICD-10-CM | POA: Insufficient documentation

## 2020-11-10 DIAGNOSIS — Z20822 Contact with and (suspected) exposure to covid-19: Secondary | ICD-10-CM | POA: Insufficient documentation

## 2020-11-10 DIAGNOSIS — I129 Hypertensive chronic kidney disease with stage 1 through stage 4 chronic kidney disease, or unspecified chronic kidney disease: Secondary | ICD-10-CM | POA: Diagnosis not present

## 2020-11-10 DIAGNOSIS — R200 Anesthesia of skin: Secondary | ICD-10-CM | POA: Diagnosis present

## 2020-11-10 LAB — COMPREHENSIVE METABOLIC PANEL
ALT: 19 U/L (ref 0–44)
AST: 29 U/L (ref 15–41)
Albumin: 4.9 g/dL (ref 3.5–5.0)
Alkaline Phosphatase: 91 U/L (ref 38–126)
Anion gap: 10 (ref 5–15)
BUN: 26 mg/dL — ABNORMAL HIGH (ref 8–23)
CO2: 24 mmol/L (ref 22–32)
Calcium: 10.4 mg/dL — ABNORMAL HIGH (ref 8.9–10.3)
Chloride: 104 mmol/L (ref 98–111)
Creatinine, Ser: 1.28 mg/dL — ABNORMAL HIGH (ref 0.44–1.00)
GFR, Estimated: 43 mL/min — ABNORMAL LOW (ref >60)
Glucose, Bld: 175 mg/dL — ABNORMAL HIGH (ref 70–99)
Potassium: 4.5 mmol/L (ref 3.5–5.1)
Sodium: 138 mmol/L (ref 135–145)
Total Bilirubin: 1.6 mg/dL — ABNORMAL HIGH (ref 0.3–1.2)
Total Protein: 8.1 g/dL (ref 6.5–8.1)

## 2020-11-10 LAB — DIFFERENTIAL
Abs Immature Granulocytes: 0.01 10*3/uL (ref 0.00–0.07)
Basophils Absolute: 0.1 10*3/uL (ref 0.0–0.1)
Basophils Relative: 1 %
Eosinophils Absolute: 0.5 10*3/uL (ref 0.0–0.5)
Eosinophils Relative: 8 %
Immature Granulocytes: 0 %
Lymphocytes Relative: 43 %
Lymphs Abs: 2.6 10*3/uL (ref 0.7–4.0)
Monocytes Absolute: 0.4 10*3/uL (ref 0.1–1.0)
Monocytes Relative: 7 %
Neutro Abs: 2.5 10*3/uL (ref 1.7–7.7)
Neutrophils Relative %: 41 %

## 2020-11-10 LAB — CBC
HCT: 40.5 % (ref 36.0–46.0)
Hemoglobin: 13.4 g/dL (ref 12.0–15.0)
MCH: 31.1 pg (ref 26.0–34.0)
MCHC: 33.1 g/dL (ref 30.0–36.0)
MCV: 94 fL (ref 80.0–100.0)
Platelets: 327 10*3/uL (ref 150–400)
RBC: 4.31 MIL/uL (ref 3.87–5.11)
RDW: 12.9 % (ref 11.5–15.5)
WBC: 5.9 10*3/uL (ref 4.0–10.5)
nRBC: 0 % (ref 0.0–0.2)

## 2020-11-10 LAB — PROTIME-INR
INR: 0.9 (ref 0.8–1.2)
Prothrombin Time: 12.5 seconds (ref 11.4–15.2)

## 2020-11-10 LAB — RESP PANEL BY RT-PCR (FLU A&B, COVID) ARPGX2
Influenza A by PCR: NEGATIVE
Influenza B by PCR: NEGATIVE
SARS Coronavirus 2 by RT PCR: NEGATIVE

## 2020-11-10 LAB — CBG MONITORING, ED: Glucose-Capillary: 177 mg/dL — ABNORMAL HIGH (ref 70–99)

## 2020-11-10 LAB — APTT: aPTT: 28 seconds (ref 24–36)

## 2020-11-10 MED ORDER — ATORVASTATIN CALCIUM 20 MG PO TABS: 40.0000 mg | ORAL_TABLET | Freq: Every day | ORAL | Status: DC

## 2020-11-10 MED ORDER — GABAPENTIN 100 MG PO CAPS
100.0000 mg | ORAL_CAPSULE | Freq: Every day | ORAL | Status: DC
  Administered 2020-11-10: 100 mg via ORAL
  Filled 2020-11-10: qty 1

## 2020-11-10 MED ORDER — INSULIN ASPART 100 UNIT/ML IJ SOLN: 0.0000 [IU] | Freq: Three times a day (TID) | INTRAMUSCULAR | Status: DC

## 2020-11-10 MED ORDER — ACETAMINOPHEN 325 MG PO TABS: 650.0000 mg | ORAL_TABLET | ORAL | Status: DC | PRN

## 2020-11-10 MED ORDER — ALBUTEROL SULFATE HFA 108 (90 BASE) MCG/ACT IN AERS: 2.0000 | INHALATION_SPRAY | Freq: Four times a day (QID) | RESPIRATORY_TRACT | Status: DC | PRN

## 2020-11-10 MED ORDER — MELATONIN 5 MG PO TABS
2.5000 mg | ORAL_TABLET | Freq: Every day | ORAL | Status: DC
  Administered 2020-11-10: 2.5 mg via ORAL
  Filled 2020-11-10: qty 1

## 2020-11-10 MED ORDER — HEPARIN SODIUM (PORCINE) 5000 UNIT/ML IJ SOLN
5000.0000 [IU] | Freq: Three times a day (TID) | INTRAMUSCULAR | Status: DC
  Administered 2020-11-10: 5000 [IU] via SUBCUTANEOUS
  Filled 2020-11-10: qty 1

## 2020-11-10 MED ORDER — FLUTICASONE FUROATE-VILANTEROL 200-25 MCG/INH IN AEPB
1.0000 | INHALATION_SPRAY | Freq: Every day | RESPIRATORY_TRACT | Status: DC
  Filled 2020-11-10: qty 28

## 2020-11-10 MED ORDER — STROKE: EARLY STAGES OF RECOVERY BOOK: Freq: Once | Status: DC

## 2020-11-10 MED ORDER — ACETAMINOPHEN 160 MG/5ML PO SOLN
650.0000 mg | ORAL | Status: DC | PRN
  Filled 2020-11-10: qty 20.3

## 2020-11-10 MED ORDER — INSULIN ASPART 100 UNIT/ML IJ SOLN: 0.0000 [IU] | Freq: Every day | INTRAMUSCULAR | Status: DC

## 2020-11-10 MED ORDER — LEVOTHYROXINE SODIUM 50 MCG PO TABS: 50.0000 ug | ORAL_TABLET | Freq: Every morning | ORAL | Status: DC

## 2020-11-10 MED ORDER — ACETAMINOPHEN 650 MG RE SUPP: 650.0000 mg | RECTAL | Status: DC | PRN

## 2020-11-10 MED ORDER — ASPIRIN EC 81 MG PO TBEC: 81.0000 mg | DELAYED_RELEASE_TABLET | Freq: Every day | ORAL | Status: DC

## 2020-11-10 MED ORDER — ALBUTEROL SULFATE (2.5 MG/3ML) 0.083% IN NEBU: 2.5000 mg | INHALATION_SOLUTION | Freq: Four times a day (QID) | RESPIRATORY_TRACT | Status: DC | PRN

## 2020-11-10 MED ORDER — SENNOSIDES-DOCUSATE SODIUM 8.6-50 MG PO TABS: 1.0000 | ORAL_TABLET | Freq: Every evening | ORAL | Status: DC | PRN

## 2020-11-10 MED ORDER — ASPIRIN EC 325 MG PO TBEC
325.0000 mg | DELAYED_RELEASE_TABLET | Freq: Once | ORAL | Status: AC
  Administered 2020-11-10: 325 mg via ORAL
  Filled 2020-11-10: qty 1

## 2020-11-10 NOTE — ED Notes (Signed)
Patient transported to MRI 

## 2020-11-10 NOTE — Progress Notes (Signed)
Error. ng 

## 2020-11-10 NOTE — H&P (Signed)
History and Physical    Felicia Roberts [id number] DOB: 12-16-1940 DOA: 11/10/2020  PCP: Tracie Harrier, MD  Chief Complaint: Numbness, weakness, tingling  HPI: Felicia Roberts is a 80 y.o. female with a past medical history of hypertension, non-insulin-dependent diabetes mellitus type 2, hyperlipidemia, asthma, hypothyroidism.  This patient presents to the emergency department due to awakening in the middle of the night at 2 AM with numbness and tingling in her whole body from the neck down.  It also went into her face.  She felt like she could not speak.  She was able to ambulate.  She stated that the numbness did improve and she was able to fall back asleep.  Awakened again at 5 AM with numbness and tingling now on the right side.  She also felt weak on her right upper extremity.  Daughter is present at bedside.  She stated a few weeks ago that she had similar symptomatology that was not as severe but went away on its own.  No known history of TIA/CVA.  He is not on any blood thinners or antiplatelet medication.  [place] is a mild headache.  No nausea.  No vomiting.  No abdominal pain.  No fevers or chills.  No recent trauma.  The ED provider did discuss the case with the neurologist who will be following along in consultation.  ED Course: CBC, CMP, PTT/INR, CT head without contrast, MRI without contrast brain pending, MRI without contrast C-spine pending, EKG.  Review of Systems: 14 point review of systems is negative except for what is mentioned above in the HPI.   Past Medical History:  Diagnosis Date   Asthma    Chicken pox    Colon polyps    Diabetes mellitus without complication ([place])    Diverticulosis    Hyperlipidemia    Hypertension     Past Surgical History:  Procedure Laterality Date   BLADDER SURGERY     COLONOSCOPY WITH PROPOFOL N/A 09/25/2017   Procedure: COLONOSCOPY WITH PROPOFOL;  Surgeon: Toledo, Benay Pike, MD;  Location: [facility] ENDOSCOPY;  Service:  Gastroenterology;  Laterality: N/A;   ESOPHAGOGASTRODUODENOSCOPY (EGD) WITH PROPOFOL N/A 09/25/2017   Procedure: ESOPHAGOGASTRODUODENOSCOPY (EGD) WITH PROPOFOL;  Surgeon: Toledo, Benay Pike, MD;  Location: [facility] ENDOSCOPY;  Service: Gastroenterology;  Laterality: N/A;   URETHRAL DIVERTICULUM REPAIR  1980    Social History   Socioeconomic History   Marital status: Widowed    Spouse name: Not on file   Number of children: Not on file   Years of education: Not on file   Highest education level: Not on file  Occupational History   Not on file  Tobacco Use   Smoking status: Never   Smokeless tobacco: Never  Vaping Use   Vaping Use: Never used  Substance and Sexual Activity   Alcohol use: No   Drug use: No   Sexual activity: Not on file  Other Topics Concern   Not on file  Social History Narrative   Not on file   Social Determinants of Health   Financial Resource Strain: Not on file  Food Insecurity: Not on file  Transportation Needs: Not on file  Physical Activity: Not on file  Stress: Not on file  Social Connections: Not on file  Intimate Partner Violence: Not on file    Allergies  Allergen Reactions   Farxiga [Dapagliflozin] Anaphylaxis   Toviaz [Fesoterodine Fumarate Er] Other (See Comments)    Memory loss   Januvia [Sitagliptin] Rash    Family History  Problem Relation Age of Onset   Cancer Father        prostate   Diabetes Sister    Hypertension Sister    AAA (abdominal aortic aneurysm) Brother    Diabetes Sister    Hypertension Sister    Diabetes Sister    Hypertension Sister    Diabetes Sister    Hypertension Sister    Hypertension Mother    Heart disease Mother    Diabetes Mother    Breast cancer Neg Hx     Prior to Admission medications   Medication Sig Start Date End Date Taking? Authorizing Provider  ADVAIR HFA 115-21 MCG/ACT inhaler Inhale 2 puffs into the lungs every 12 (twelve) hours. 06/06/20  Yes [provider]  albuterol  (PROVENTIL HFA;VENTOLIN HFA) 108 (90 Base) MCG/ACT inhaler INHALE ONE TO TWO PUFFS BY MOUTH EVERY 6 HOURS AS NEEDED FOR WHEEZING OR  SHORTNESS  OF  BREATH 12/19/17  Yes Leone Haven, MD  amLODipine (NORVASC) 5 MG tablet TAKE 1 TABLET BY MOUTH ONCE DAILY 04/21/18  Yes Leone Haven, MD  atorvastatin (LIPITOR) 40 MG tablet Take 1 tablet (40 mg total) by mouth daily. 09/04/17  Yes Leone Haven, MD  EUTHYROX 50 MCG tablet Take 50 mcg by mouth every morning. 10/21/20  Yes [provider]  gabapentin (NEURONTIN) 100 MG capsule Take 100 mg by mouth at bedtime. 08/17/20  Yes [provider]  glipiZIDE (GLUCOTROL XL) 5 MG 24 hr tablet Take 5 mg by mouth daily. 08/17/20  Yes [provider]  lisinopril (PRINIVIL,ZESTRIL) 20 MG tablet TAKE 1 TABLET BY MOUTH ONCE DAILY 04/21/18  Yes Leone Haven, MD  metFORMIN (GLUCOPHAGE-XR) 500 MG 24 hr tablet Take 1,000 mg by mouth daily after supper.    Yes [provider]  pioglitazone (ACTOS) 30 MG tablet Take 30 mg by mouth daily. 10/24/20  Yes [provider]  albuterol (PROVENTIL) (2.5 MG/3ML) 0.083% nebulizer solution Take 3 mLs (2.5 mg total) by nebulization every 6 (six) hours as needed for wheezing or shortness of breath. 01/04/16   Coral Spikes, DO    Physical Exam: Vitals:   11/10/20 1606 11/10/20 1630 11/10/20 1700 11/10/20 1800  BP: (!) 172/79 (!) 150/71 108/80 (!) 153/77  Pulse: (!) 58 60 65 60  Resp: '15 18 17 15  '$ Temp:      TempSrc:      SpO2: 99% 98% 97% 98%  Weight:      Height:         General:  Appears calm and comfortable and is in NAD Cardiovascular:  RRR, no m/r/g.  Respiratory:   CTA bilaterally with no wheezes/rales/rhonchi.  Normal respiratory effort. Abdomen:  soft, NT, ND, NABS Skin:  no rash or induration seen on limited exam Musculoskeletal:  grossly normal tone BUE/BLE, good ROM, no bony abnormality Lower extremity:  No LE edema.  Limited foot exam with no ulcerations.   2+ distal pulses. Psychiatric:  grossly normal mood and affect, speech fluent and appropriate, AOx3 Neurologic:  CN 2-12 grossly intact, moves all extremities in coordinated fashion, sensation intact.  5 out of 5 muscle strength bilateral upper and lower extremities.  No pronator drift.  No facial droop.  No slurred speech noted at bedside. No sensory deficits to bilateral lower extremities.    Radiological Exams on Admission: Independently reviewed - see discussion in A/P where applicable  CT HEAD WO CONTRAST  Result Date: 11/10/2020 CLINICAL DATA:  Neuro deficit, stroke suspected  EXAM: CT HEAD WITHOUT CONTRAST TECHNIQUE: Contiguous axial images were obtained from the base of the skull through the vertex without intravenous contrast. COMPARISON:  04/11/2018 CT orbits, 01/14/2013 CT head FINDINGS: Brain: No evidence of acute infarction, hemorrhage, hydrocephalus, extra-axial collection or mass lesion/mass effect. Bilateral basal ganglia calcifications. Vascular: No hyperdense vessel or unexpected calcification. Skull: Normal. Negative for fracture or focal lesion. Sinuses/Orbits: Mild mucosal thickening throughout the paranasal sinuses. No acute process in the orbits. Other: The mastoid air cells are well aerated. IMPRESSION: No acute intracranial process. Electronically Signed   By: Merilyn Baba M.D.   On: 11/10/2020 14:59    EKG: Independently reviewed.  NSR with rate 71   Labs on Admission: I have personally reviewed the available labs and imaging studies at the time of the admission.  Pertinent labs: BUN 26, creatinine 1.28, blood glucose 175, calcium 10.4, T bili 1.6.   Assessment/Plan: Subjective right upper extremity weakness/numbness and tingling secondary to TIA versus cervical radiculopathy: This patient will be admitted to the medical/surgical floor under observation status with cardiac monitoring.  Neurology aware of the patient and will be following in consultation.  CT head  showed no acute findings. Low clinical suspicion for acute CVA.  MRI of the brain without contrast and MRI of the C-spine without contrast have been ordered.  Obtain carotid duplex ultrasound and echocardiogram.  We will allow for permissive hypertension until acute CVA has been ruled out.  Neurochecks have been ordered.  Hypercalcemia: Initial calcium 10.4.  Will obtain intact PTH.  Chronic kidney disease stage IIIb: Appears at baseline.  Non-insulin-dependent diabetes mellitus type 2: Hold patient's home glipizide 5 mg daily, Glucophage XR 1000 mg daily and Actos 30 mg daily.  We will start sliding scale insulin with Accu-Cheks before meals and at bedtime.  Obtain A1c.  Hypertension: The patient's Prinivil 20 mg daily and amlodipine 5 mg daily are currently on hold.  Hyperlipidemia: Continue home patient's atorvastatin 40 mg daily.  Obtain lipid panel.  Hypothyroidism: Continue patient's Euthyrox 50 mcg daily.  Neuropathy: Continue home gabapentin 100 mg nightly.  Asthma: Continue patient's home Advair inhaler and Proventil HFA inhaler.  Level of Care: MedSurg DVT prophylaxis: Heparin subcu Code Status: Full code Consults: Neurology Admission status: Observation   Leslee Home DO [facility]   How to contact the [facility] Attending or Consulting provider [place] or covering provider during after hours [place], for this patient?  Check the care team in [facility] and look for a) attending/consulting [facility] provider listed and b) the [facility] team listed Log into [email or web address] and use [place]'s universal password to access. If you do not have the password, please contact the hospital operator. Locate the [facility] provider you are looking for under [facility] and page to a number that you can be directly reached. If you still have difficulty reaching the provider, please page the [facility] (Director on Call) for the Hospitalists listed on amion for assistance.   11/10/2020, 6:29 PM

## 2020-11-10 NOTE — ED Provider Notes (Signed)
[facility] Emergency Department Provider Note ____________________________________________   Event Date/Time   First MD Initiated Contact with Patient 11/10/20 1607     (approximate)  I have reviewed the triage vital signs and the nursing notes.   HISTORY  Chief Complaint Leg Pain and Numbness    HPI Janeice Welton is a 80 y.o. female with PMH as noted below including diabetes, hypertension and hyperlipidemia who presents with numbness initially to her whole body but now mainly on the right side.  The patient states that she awoke around 2 AM and felt numbness and tingling to her whole body from the neck down.  It also went into her face.  She felt like she could not speak.  She was able to get up and walk around, and states that the numbness started to improve.  It lasted an hour or 2 and she may have gone back to sleep.  She states that around 5 AM she awoke again with numbness and tingling just to the right side, mainly in the right upper arm.  She denies any associated weakness, either during the night or currently.  She denies any difficulty speaking right now and has had no vision changes.  She denies any prior history of similar episodes.    Past Medical History:  Diagnosis Date   Asthma    Chicken pox    Colon polyps    Diabetes mellitus without complication ([place])    Diverticulosis    Hyperlipidemia    Hypertension     Patient Active Problem List   Diagnosis Date Noted   Transient ischemic attack 11/10/2020   Perimenopausal atrophic vaginitis 04/24/2018   Pure hypercholesterolemia 04/24/2018   Atypical chest pain 10/18/2017   Excessive cerumen in both ear canals 10/18/2017   Tinnitus of both ears 07/19/2017   History of colon polyps 01/18/2017   Epigastric pain 04/06/2016   CKD (chronic kidney disease) stage 3, GFR 30-59 ml/min (HCC) 06/20/2015   Constipation 06/20/2015   Essential hypertension 06/19/2015   Hyperlipidemia 04/18/2015    DM (diabetes mellitus), type 2, uncontrolled, with renal complications ([place]) [id number]   Asthma 04/18/2015    Past Surgical History:  Procedure Laterality Date   BLADDER SURGERY     COLONOSCOPY WITH PROPOFOL N/A 09/25/2017   Procedure: COLONOSCOPY WITH PROPOFOL;  Surgeon: Toledo, Benay Pike, MD;  Location: [facility] ENDOSCOPY;  Service: Gastroenterology;  Laterality: N/A;   ESOPHAGOGASTRODUODENOSCOPY (EGD) WITH PROPOFOL N/A 09/25/2017   Procedure: ESOPHAGOGASTRODUODENOSCOPY (EGD) WITH PROPOFOL;  Surgeon: Toledo, Benay Pike, MD;  Location: [facility] ENDOSCOPY;  Service: Gastroenterology;  Laterality: N/A;   [place]    Prior to Admission medications   Medication Sig Start Date End Date Taking? Authorizing Provider  ADVAIR HFA 115-21 MCG/ACT inhaler Inhale 2 puffs into the lungs every 12 (twelve) hours. 06/06/20  Yes [provider]  albuterol (PROVENTIL HFA;VENTOLIN HFA) 108 (90 Base) MCG/ACT inhaler INHALE ONE TO TWO PUFFS BY MOUTH EVERY 6 HOURS AS NEEDED FOR WHEEZING OR  SHORTNESS  OF  BREATH 12/19/17  Yes Leone Haven, MD  amLODipine (NORVASC) 5 MG tablet TAKE 1 TABLET BY MOUTH ONCE DAILY 04/21/18  Yes Leone Haven, MD  atorvastatin (LIPITOR) 40 MG tablet Take 1 tablet (40 mg total) by mouth daily. 09/04/17  Yes Leone Haven, MD  EUTHYROX 50 MCG tablet Take 50 mcg by mouth every morning. 10/21/20  Yes [provider]  gabapentin (NEURONTIN) 100 MG capsule Take 100 mg by  mouth at bedtime. 08/17/20  Yes [provider]  glipiZIDE (GLUCOTROL XL) 5 MG 24 hr tablet Take 5 mg by mouth daily. 08/17/20  Yes [provider]  lisinopril (PRINIVIL,ZESTRIL) 20 MG tablet TAKE 1 TABLET BY MOUTH ONCE DAILY 04/21/18  Yes Leone Haven, MD  metFORMIN (GLUCOPHAGE-XR) 500 MG 24 hr tablet Take 1,000 mg by mouth daily after supper.    Yes [provider]  pioglitazone (ACTOS) 30 MG tablet Take 30 mg by mouth daily. 10/24/20  Yes [provider]  albuterol (PROVENTIL) (2.5 MG/3ML) 0.083% nebulizer solution Take 3 mLs (2.5 mg total) by nebulization every 6 (six) hours as needed for wheezing or shortness of breath. 01/04/16   Coral Spikes, DO    Allergies Farxiga [dapagliflozin], Toviaz [fesoterodine fumarate er], and Januvia [sitagliptin]  Family History  Problem Relation Age of Onset   Cancer Father        prostate   Diabetes Sister    Hypertension Sister    AAA (abdominal aortic aneurysm) Brother    Diabetes Sister    Hypertension Sister    Diabetes Sister    Hypertension Sister    Diabetes Sister    Hypertension Sister    Hypertension Mother    Heart disease Mother    Diabetes Mother    Breast cancer Neg Hx     Social History Social History   Tobacco Use   Smoking status: Never   Smokeless tobacco: Never  Vaping Use   Vaping Use: Never used  Substance Use Topics   Alcohol use: No   Drug use: No    Review of Systems  Constitutional: No fever/chills Eyes: No visual changes. ENT: No sore throat. Cardiovascular: Denies chest pain. Respiratory: Denies shortness of breath. Gastrointestinal: No vomiting or diarrhea.  Genitourinary: Negative for dysuria.  Musculoskeletal: Negative for back pain. Skin: Negative for rash. Neurological: Negative for headache.  Positive for right arm numbness/tingling.   ____________________________________________   PHYSICAL EXAM:  VITAL SIGNS: ED Triage Vitals  Enc Vitals Group     BP 11/10/20 1322 (!) 157/72     Pulse Rate 11/10/20 1322 64     Resp 11/10/20 1322 18     Temp 11/10/20 1322 98.6 F (37 C)     Temp Source 11/10/20 1322 Oral     SpO2 11/10/20 1322 97 %     Weight 11/10/20 1324 125 lb (56.7 kg)     Height 11/10/20 1324 5' (1.524 m)     Head Circumference --      Peak Flow --      Pain Score 11/10/20 1327 8     Pain Loc --      Pain Edu? --      Excl. in [place]? --     Constitutional: Alert and oriented. Well appearing and in no  acute distress. Eyes: Conjunctivae are normal.  EOMI.  PERRLA. Head: Atraumatic. Nose: No congestion/rhinnorhea. Mouth/Throat: Mucous membranes are moist.   Neck: Normal range of motion.  Cardiovascular: Normal rate, regular rhythm. Good peripheral circulation. Respiratory: Normal respiratory effort.  No retractions.  Gastrointestinal: No distention.  Musculoskeletal: No lower extremity edema.  No calf or popliteal swelling or tenderness.  Extremities warm and well perfused.  Neurologic:  Normal speech and language.  5/5 motor strength to bilateral upper and lower extremities.  No pronator drift.  No facial droop.  Numbness to proximal and dorsal right arm.  No sensory deficits to bilateral lower extremities. Skin:  Skin  is warm and dry. No rash noted. Psychiatric: Mood and affect are normal. Speech and behavior are normal.  ____________________________________________   LABS (all labs ordered are listed, but only abnormal results are displayed)  Labs Reviewed  COMPREHENSIVE METABOLIC PANEL - Abnormal; Notable for the following components:      Result Value   Glucose, Bld 175 (*)    BUN 26 (*)    Creatinine, Ser 1.28 (*)    Calcium 10.4 (*)    Total Bilirubin 1.6 (*)    GFR, Estimated 43 (*)    All other components within normal limits  CBG MONITORING, ED - Abnormal; Notable for the following components:   Glucose-Capillary 177 (*)    All other components within normal limits  RESP PANEL BY RT-PCR (FLU A&B, COVID) ARPGX2  PROTIME-INR  APTT  CBC  DIFFERENTIAL  HEMOGLOBIN A1C  LIPID PANEL   ____________________________________________  EKG  ED ECG REPORT I, Arta Silence, the attending physician, personally viewed and interpreted this ECG.  Date: 11/10/2020 EKG Time: 1330 Rate: 71 Rhythm: normal sinus rhythm QRS Axis: normal Intervals: normal ST/T Wave abnormalities: Nonspecific anterior T wave abnormalities Narrative Interpretation: no evidence of acute  ischemia; no significant change when compared to EKG of 12/20/2017  ____________________________________________  RADIOLOGY  CT head: No ICH or other acute abnormality  ____________________________________________   PROCEDURES  Procedure(s) performed: No  Procedures  Critical Care performed: No ____________________________________________   INITIAL IMPRESSION / ASSESSMENT AND PLAN / ED COURSE  Pertinent labs & imaging results that were available during my care of the patient were reviewed by me and considered in my medical decision making (see chart for details).   80 year old female with PMH as noted above including diabetes, hypertension, and hyperlipidemia presents with acute onset of bilateral numbness to her entire body last night, associated with difficulty speaking, and now with some numbness and tingling to the right arm remaining.  All other symptoms have resolved.  She reports some mild pain to her left leg but no numbness there.  I reviewed the past medical records in [place].  The patient has no recent ED visits or admissions.  She had an ABI ultrasound of bilateral lower extremities in March showing borderline peripheral arterial disease.  On exam currently, the patient is overall well-appearing.  Her vital signs are normal except for hypertension.  Neurologic exam is normal except for remaining numbness in a roughly C5/C6 distribution in the right arm.  She has no weakness, facial droop, or other neurologic deficits on exam currently.  All 4 extremities have strong pulses and cap refill.  Differential includes TIA, CVA, cervical radiculopathy, peripheral neuropathy, electrolyte abnormality.  I do not suspect vascular etiology given the distribution of the symptoms.  CT head was obtained from triage and is negative.  Initial lab work-up is also unremarkable.  I will consult neurology for further recommendations.  ----------------------------------------- 6:00 PM on  11/10/2020 -----------------------------------------  Dr. Quinn Axe from neurology was consulted and recommended MRI of the brain and C-spine and admission for TIA work-up.  I consulted Dr. Rowe Pavy from the hospitalist service for admission.  ____________________________________________   FINAL CLINICAL IMPRESSION(S) / ED DIAGNOSES  Final diagnoses:  Right sided numbness      NEW MEDICATIONS STARTED DURING THIS VISIT:  New Prescriptions   No medications on file     Note:  This document was prepared using Dragon voice recognition software and may include unintentional dictation errors.    Arta Silence, MD 11/10/20 2340

## 2020-11-10 NOTE — ED Notes (Signed)
Visitor at bedside.

## 2020-11-10 NOTE — ED Notes (Signed)
US at bedside at this time 

## 2020-11-10 NOTE — ED Triage Notes (Signed)
Pt comes into the ED via POV c/o right side numbness that she woke up with as well as bilateral leg pain.  Pts speech is normal, no drift noted at this time.  PT denies any blood thinner use.  Pt ambulatory to triage and in NAD currently.

## 2020-11-10 NOTE — ED Notes (Signed)
Pt ambulatory to and from RR at this time.

## 2020-11-11 DIAGNOSIS — R200 Anesthesia of skin: Secondary | ICD-10-CM

## 2020-11-11 DIAGNOSIS — G459 Transient cerebral ischemic attack, unspecified: Secondary | ICD-10-CM | POA: Diagnosis not present

## 2020-11-11 LAB — HEMOGLOBIN A1C
Hgb A1c MFr Bld: 9.3 % — ABNORMAL HIGH (ref 4.8–5.6)
Mean Plasma Glucose: 220.21 mg/dL

## 2020-11-11 LAB — LIPID PANEL
Cholesterol: 129 mg/dL (ref 0–200)
HDL: 66 mg/dL (ref >40)
LDL Cholesterol: 40 mg/dL (ref 0–99)
Total CHOL/HDL Ratio: 2 RATIO
Triglycerides: 117 mg/dL (ref <150)
VLDL: 23 mg/dL (ref 0–40)

## 2020-11-11 LAB — CBG MONITORING, ED: Glucose-Capillary: 120 mg/dL — ABNORMAL HIGH (ref 70–99)

## 2020-11-11 NOTE — ED Notes (Signed)
Phlebotomy at bedside.

## 2020-11-11 NOTE — Discharge Summary (Signed)
Felicia Roberts [id number] DOB: Jul 30, 1940 DOA: 11/10/2020  PCP: Tracie Harrier, MD  Admit date: 11/10/2020 Discharge date: 11/11/2020  Time spent: 35 minutes  Recommendations for Outpatient Follow-up:  Pcp f/u 1-2 weeks     Discharge Diagnoses:  Active Problems:   Transient ischemic attack   Discharge Condition: stable  Diet recommendation: carb controlled heart healthy  Filed Weights   11/10/20 1324  Weight: 56.7 kg    History of present illness:  From admission hpi: Felicia Roberts is a 80 y.o. female with a past medical history of hypertension, non-insulin-dependent diabetes mellitus type 2, hyperlipidemia, asthma, hypothyroidism.   This patient presents to the emergency department due to awakening in the middle of the night at 2 AM with numbness and tingling in her whole body from the neck down.  It also went into her face.  She felt like she could not speak.  She was able to ambulate.  She stated that the numbness did improve and she was able to fall back asleep.  Awakened again at 5 AM with numbness and tingling now on the right side.  She also felt weak on her right upper extremity.  Daughter is present at bedside.  She stated a few weeks ago that she had similar symptomatology that was not as severe but went away on its own.  No known history of TIA/CVA.  He is not on any blood thinners or antiplatelet medication.  Dors is a mild headache.  No nausea.  No vomiting.  No abdominal pain.  No fevers or chills.  No recent trauma.  The ED provider did discuss the case with the neurologist who will be following along in consultation.  Hospital Course:  Patient admitted for w/u of possible TIA. She tells me she suffered a sensation of whole body (bilateral) numbness that has resolved. CT and MRI of head show no signs CVA. MRI of neck w/o sig abnormality. Currently patient complaints of occasional lower extremity leg muscle cramps, otherwise no neurologic symptoms and neuro  exam is benign. Case discussed w/ neurology who agree this does not sound like a neurologic problem that requires continued inpatient evaluation. Will discharge with PCP f/u.  Procedures: none   Consultations: none  Discharge Exam: Vitals:   11/11/20 0216 11/11/20 0600  BP: (!) 106/58 109/61  Pulse: 60 (!) 50  Resp: 18 18  Temp:    SpO2: 94% 93%    General: NAD Cardiovascular: RRR, no murmur Respiratory: CTAB Neuro: cn2-12 grossly intact, normal finger to nose, speech fluent, 5/5 upper and lower strength, distal sensation to light touch intact and symmetric  Discharge Instructions   Discharge Instructions     Diet - low sodium heart healthy   Complete by: As directed    Increase activity slowly   Complete by: As directed       Allergies as of 11/11/2020       Reactions   Farxiga [dapagliflozin] Anaphylaxis   Toviaz [fesoterodine Fumarate Er] Other (See Comments)   Memory loss   Januvia [sitagliptin] Rash        Medication List     TAKE these medications    Advair HFA 115-21 MCG/ACT inhaler Generic drug: fluticasone-salmeterol Inhale 2 puffs into the lungs every 12 (twelve) hours.   albuterol (2.5 MG/3ML) 0.083% nebulizer solution Commonly known as: PROVENTIL Take 3 mLs (2.5 mg total) by nebulization every 6 (six) hours as needed for wheezing or shortness of breath.   albuterol 108 (90 Base) MCG/ACT inhaler Commonly known  as: VENTOLIN HFA INHALE ONE TO TWO PUFFS BY MOUTH EVERY 6 HOURS AS NEEDED FOR WHEEZING OR  SHORTNESS  OF  BREATH   amLODipine 5 MG tablet Commonly known as: NORVASC TAKE 1 TABLET BY MOUTH ONCE DAILY   atorvastatin 40 MG tablet Commonly known as: LIPITOR Take 1 tablet (40 mg total) by mouth daily.   Euthyrox 50 MCG tablet Generic drug: levothyroxine Take 50 mcg by mouth every morning.   gabapentin 100 MG capsule Commonly known as: NEURONTIN Take 100 mg by mouth at bedtime.   glipiZIDE 5 MG 24 hr tablet Commonly known as:  GLUCOTROL XL Take 5 mg by mouth daily.   lisinopril 20 MG tablet Commonly known as: ZESTRIL TAKE 1 TABLET BY MOUTH ONCE DAILY   metFORMIN 500 MG 24 hr tablet Commonly known as: GLUCOPHAGE-XR Take 1,000 mg by mouth daily after supper.   pioglitazone 30 MG tablet Commonly known as: ACTOS Take 30 mg by mouth daily.       Allergies  Allergen Reactions   Wilder Glade [Dapagliflozin] Anaphylaxis   Toviaz [Fesoterodine Fumarate Er] Other (See Comments)    Memory loss   Januvia [Sitagliptin] Rash    Follow-up Information     Tracie Harrier, MD. Schedule an appointment as soon as possible for a visit.   Specialty: Internal Medicine Contact information: [address] [place] [address] [phone]                  The results of significant diagnostics from this hospitalization (including imaging, microbiology, ancillary and laboratory) are listed below for reference.    Significant Diagnostic Studies: CT HEAD WO CONTRAST  Result Date: 11/10/2020 CLINICAL DATA:  Neuro deficit, stroke suspected EXAM: CT HEAD WITHOUT CONTRAST TECHNIQUE: Contiguous axial images were obtained from the base of the skull through the vertex without intravenous contrast. COMPARISON:  04/11/2018 CT orbits, 01/14/2013 CT head FINDINGS: Brain: No evidence of acute infarction, hemorrhage, hydrocephalus, extra-axial collection or mass lesion/mass effect. Bilateral basal ganglia calcifications. Vascular: No hyperdense vessel or unexpected calcification. Skull: Normal. Negative for fracture or focal lesion. Sinuses/Orbits: Mild mucosal thickening throughout the paranasal sinuses. No acute process in the orbits. Other: The mastoid air cells are well aerated. IMPRESSION: No acute intracranial process. Electronically Signed   By: Merilyn Baba M.D.   On: 11/10/2020 14:59   MR BRAIN WO CONTRAST  Result Date: 11/10/2020 CLINICAL DATA:  Numbness and tingling EXAM: MRI HEAD  WITHOUT CONTRAST MRI CERVICAL SPINE WITHOUT CONTRAST TECHNIQUE: Multiplanar, multiecho pulse sequences of the brain and surrounding structures, and cervical spine, to include the craniocervical junction and cervicothoracic junction, were obtained without intravenous contrast. COMPARISON:  None. FINDINGS: MRI HEAD FINDINGS Brain: No acute infarct, mass effect or extra-axial collection. No acute or chronic hemorrhage. There is multifocal hyperintense T2-weighted signal within the white matter. Parenchymal volume and CSF spaces are normal. The midline structures are normal. Vascular: Major flow voids are preserved. Skull and upper cervical spine: Normal calvarium and skull base. Visualized upper cervical spine and soft tissues are normal. Sinuses/Orbits:Mild mucosal thickening.  Normal orbits. MRI CERVICAL SPINE FINDINGS Alignment: Physiologic. Vertebrae: No fracture, evidence of discitis, or bone lesion. Cord: Normal signal and morphology. Posterior Fossa, vertebral arteries, paraspinal tissues: Negative. Disc levels: C1-2: Unremarkable. C2-3: Normal disc space and facet joints. There is no spinal canal stenosis. No neural foraminal stenosis. C3-4: Small disc bulge. There is no spinal canal stenosis. Mild bilateral neural foraminal stenosis. C4-5: Small disc bulge with uncovertebral  spurring. There is no spinal canal stenosis. Mild bilateral neural foraminal stenosis. C5-6: Small disc bulge with left subarticular focality. There is no spinal canal stenosis. Mild right and moderate left neural foraminal stenosis. C6-7: Small disc bulge. There is no spinal canal stenosis. No neural foraminal stenosis. C7-T1: Normal disc space and facet joints. There is no spinal canal stenosis. No neural foraminal stenosis. IMPRESSION: 1. No acute intracranial abnormality. 2. Multifocal hyperintense T2-weighted signal within the white matter, most consistent with chronic small vessel disease. 3. Mild cervical spinal canal stenosis and  moderate left neural foraminal stenosis at C5-6. 4. Mild bilateral neural foraminal stenosis at C3-4 and C4-5. Electronically Signed   By: Ulyses Jarred M.D.   On: 11/10/2020 20:53   MR Cervical Spine Wo Contrast  Result Date: 11/10/2020 CLINICAL DATA:  Numbness and tingling EXAM: MRI HEAD WITHOUT CONTRAST MRI CERVICAL SPINE WITHOUT CONTRAST TECHNIQUE: Multiplanar, multiecho pulse sequences of the brain and surrounding structures, and cervical spine, to include the craniocervical junction and cervicothoracic junction, were obtained without intravenous contrast. COMPARISON:  None. FINDINGS: MRI HEAD FINDINGS Brain: No acute infarct, mass effect or extra-axial collection. No acute or chronic hemorrhage. There is multifocal hyperintense T2-weighted signal within the white matter. Parenchymal volume and CSF spaces are normal. The midline structures are normal. Vascular: Major flow voids are preserved. Skull and upper cervical spine: Normal calvarium and skull base. Visualized upper cervical spine and soft tissues are normal. Sinuses/Orbits:Mild mucosal thickening.  Normal orbits. MRI CERVICAL SPINE FINDINGS Alignment: Physiologic. Vertebrae: No fracture, evidence of discitis, or bone lesion. Cord: Normal signal and morphology. Posterior Fossa, vertebral arteries, paraspinal tissues: Negative. Disc levels: C1-2: Unremarkable. C2-3: Normal disc space and facet joints. There is no spinal canal stenosis. No neural foraminal stenosis. C3-4: Small disc bulge. There is no spinal canal stenosis. Mild bilateral neural foraminal stenosis. C4-5: Small disc bulge with uncovertebral spurring. There is no spinal canal stenosis. Mild bilateral neural foraminal stenosis. C5-6: Small disc bulge with left subarticular focality. There is no spinal canal stenosis. Mild right and moderate left neural foraminal stenosis. C6-7: Small disc bulge. There is no spinal canal stenosis. No neural foraminal stenosis. C7-T1: Normal disc space and  facet joints. There is no spinal canal stenosis. No neural foraminal stenosis. IMPRESSION: 1. No acute intracranial abnormality. 2. Multifocal hyperintense T2-weighted signal within the white matter, most consistent with chronic small vessel disease. 3. Mild cervical spinal canal stenosis and moderate left neural foraminal stenosis at C5-6. 4. Mild bilateral neural foraminal stenosis at C3-4 and C4-5. Electronically Signed   By: Ulyses Jarred M.D.   On: 11/10/2020 20:53   US Carotid Bilateral  Result Date: 11/10/2020 CLINICAL DATA:  TIA EXAM: BILATERAL CAROTID DUPLEX ULTRASOUND TECHNIQUE: Pearline Cables scale imaging, color Doppler and duplex ultrasound were performed of bilateral carotid and vertebral arteries in the neck. COMPARISON:  None. FINDINGS: Criteria: Quantification of carotid stenosis is based on velocity parameters that correlate the residual internal carotid diameter with NASCET-based stenosis levels, using the diameter of the distal internal carotid lumen as the denominator for stenosis measurement. The following velocity measurements were obtained: RIGHT ICA: 58 cm/sec CCA: 83 cm/sec SYSTOLIC ICA/CCA RATIO:  0.7 ECA: 85 cm/sec LEFT ICA: 91 cm/sec CCA: 63 cm/sec SYSTOLIC ICA/CCA RATIO:  1.4 ECA: 81 cm/sec RIGHT CAROTID ARTERY: Patent with antegrade flow. No significant sclerotic plaque visible. RIGHT VERTEBRAL ARTERY:  Patent with antegrade flow. LEFT CAROTID ARTERY: Patent with antegrade flow. No significant sclerotic plaque visible. LEFT VERTEBRAL ARTERY:  Patent  with antegrade flow. IMPRESSION: 1. No hemodynamically significant stenosis in the extracranial carotid arteries. 2. Antegrade flow in the vertebral arteries. Electronically Signed   By: Ulyses Jarred M.D.   On: 11/10/2020 21:27    Microbiology: Recent Results (from the past 240 hour(s))  Resp Panel by RT-PCR (Flu A&B, Covid) Nasopharyngeal Swab     Status: None   Collection Time: 11/10/20  5:33 PM   Specimen: Nasopharyngeal Swab;  Nasopharyngeal(NP) swabs in vial transport medium  Result Value Ref Range Status   SARS Coronavirus 2 by RT PCR NEGATIVE NEGATIVE Final    Comment: (NOTE) SARS-CoV-2 target nucleic acids are NOT DETECTED.  The SARS-CoV-2 RNA is generally detectable in upper respiratory specimens during the acute phase of infection. The lowest concentration of SARS-CoV-2 viral copies this assay can detect is 138 copies/mL. A negative result does not preclude SARS-Cov-2 infection and should not be used as the sole basis for treatment or other patient management decisions. A negative result may occur with  improper specimen collection/handling, submission of specimen other than nasopharyngeal swab, presence of viral mutation(s) within the areas targeted by this assay, and inadequate number of viral copies(<138 copies/mL). A negative result must be combined with clinical observations, patient history, and epidemiological information. The expected result is Negative.  Fact Sheet for Patients:  EntrepreneurPulse.com.au  Fact Sheet for Healthcare Providers:  IncredibleEmployment.be  This test is no t yet approved or cleared by the [place] FDA and  has been authorized for detection and/or diagnosis of SARS-CoV-2 by FDA under an Emergency Use Authorization (EUA). This EUA will remain  in effect (meaning this test can be used) for the duration of the COVID-19 declaration under Section 564(b)(1) of the Act, 21 U.S.C.section 360bbb-3(b)(1), unless the authorization is terminated  or revoked sooner.       Influenza A by PCR NEGATIVE NEGATIVE Final   Influenza B by PCR NEGATIVE NEGATIVE Final    Comment: (NOTE) The Xpert Xpress SARS-CoV-2/FLU/RSV plus assay is intended as an aid in the diagnosis of influenza from Nasopharyngeal swab specimens and should not be used as a sole basis for treatment. Nasal washings and aspirates are unacceptable for Xpert Xpress  SARS-CoV-2/FLU/RSV testing.  Fact Sheet for Patients: EntrepreneurPulse.com.au  Fact Sheet for Healthcare Providers: IncredibleEmployment.be  This test is not yet approved or cleared by the [place] FDA and has been authorized for detection and/or diagnosis of SARS-CoV-2 by FDA under an Emergency Use Authorization (EUA). This EUA will remain in effect (meaning this test can be used) for the duration of the COVID-19 declaration under Section 564(b)(1) of the Act, 21 U.S.C. section 360bbb-3(b)(1), unless the authorization is terminated or revoked.  Performed at [facility], [address] [place], [place] [address]      Labs: Basic Metabolic Panel: Recent Labs  Lab 11/10/20 1329  NA 138  K 4.5  CL 104  CO2 24  GLUCOSE 175*  BUN 26*  CREATININE 1.28*  CALCIUM 10.4*   Liver Function Tests: Recent Labs  Lab 11/10/20 1329  AST 29  ALT 19  ALKPHOS 91  BILITOT 1.6*  PROT 8.1  ALBUMIN 4.9   No results for input(s): LIPASE, AMYLASE in the last 168 hours. No results for input(s): AMMONIA in the last 168 hours. CBC: Recent Labs  Lab 11/10/20 1329  WBC 5.9  NEUTROABS 2.5  HGB 13.4  HCT 40.5  MCV 94.0  PLT 327   Cardiac Enzymes: No results for input(s): CKTOTAL, CKMB, CKMBINDEX, TROPONINI in  the last 168 hours. BNP: BNP (last 3 results) No results for input(s): BNP in the last 8760 hours.  ProBNP (last 3 results) No results for input(s): PROBNP in the last 8760 hours.  CBG: Recent Labs  Lab 11/10/20 2220 11/11/20 0808  GLUCAP 177* 120*       Signed:  Desma Maxim MD.  [facility] 11/11/2020, 8:18 AM

## 2020-12-26 ENCOUNTER — Ambulatory Visit: Admission: RE | Admit: 2020-12-26 | Payer: Medicare HMO | Source: Home / Self Care

## 2020-12-26 ENCOUNTER — Encounter: Admission: RE | Payer: Self-pay | Source: Home / Self Care

## 2020-12-26 SURGERY — EGD (ESOPHAGOGASTRODUODENOSCOPY)
Anesthesia: General

## 2020-12-29 ENCOUNTER — Encounter: Payer: Self-pay | Admitting: *Deleted

## 2020-12-30 ENCOUNTER — Ambulatory Visit: Payer: Medicare HMO | Admitting: Anesthesiology

## 2020-12-30 ENCOUNTER — Encounter
Admission: RE | Disposition: A | Discharge status: Home / Self Care | Payer: Self-pay | Source: Home / Self Care | Attending: Gastroenterology

## 2020-12-30 ENCOUNTER — Ambulatory Visit
Admission: RE | Admit: 2020-12-30 | Discharge: 2020-12-30 | Disposition: A | Discharge status: Home / Self Care | Payer: Medicare HMO | Attending: Gastroenterology | Admitting: Gastroenterology

## 2020-12-30 ENCOUNTER — Other Ambulatory Visit: Payer: Self-pay

## 2020-12-30 ENCOUNTER — Encounter: Payer: Self-pay | Admitting: *Deleted

## 2020-12-30 DIAGNOSIS — I129 Hypertensive chronic kidney disease with stage 1 through stage 4 chronic kidney disease, or unspecified chronic kidney disease: Secondary | ICD-10-CM | POA: Insufficient documentation

## 2020-12-30 DIAGNOSIS — Z888 Allergy status to other drugs, medicaments and biological substances status: Secondary | ICD-10-CM | POA: Diagnosis not present

## 2020-12-30 DIAGNOSIS — N189 Chronic kidney disease, unspecified: Secondary | ICD-10-CM | POA: Diagnosis not present

## 2020-12-30 DIAGNOSIS — K219 Gastro-esophageal reflux disease without esophagitis: Secondary | ICD-10-CM | POA: Diagnosis not present

## 2020-12-30 DIAGNOSIS — R1310 Dysphagia, unspecified: Secondary | ICD-10-CM | POA: Diagnosis not present

## 2020-12-30 DIAGNOSIS — K3189 Other diseases of stomach and duodenum: Secondary | ICD-10-CM | POA: Insufficient documentation

## 2020-12-30 DIAGNOSIS — Z7951 Long term (current) use of inhaled steroids: Secondary | ICD-10-CM | POA: Diagnosis not present

## 2020-12-30 DIAGNOSIS — K449 Diaphragmatic hernia without obstruction or gangrene: Secondary | ICD-10-CM | POA: Insufficient documentation

## 2020-12-30 DIAGNOSIS — E785 Hyperlipidemia, unspecified: Secondary | ICD-10-CM | POA: Diagnosis not present

## 2020-12-30 DIAGNOSIS — Z7984 Long term (current) use of oral hypoglycemic drugs: Secondary | ICD-10-CM | POA: Insufficient documentation

## 2020-12-30 DIAGNOSIS — E1122 Type 2 diabetes mellitus with diabetic chronic kidney disease: Secondary | ICD-10-CM | POA: Insufficient documentation

## 2020-12-30 DIAGNOSIS — J45909 Unspecified asthma, uncomplicated: Secondary | ICD-10-CM | POA: Diagnosis not present

## 2020-12-30 DIAGNOSIS — K5790 Diverticulosis of intestine, part unspecified, without perforation or abscess without bleeding: Secondary | ICD-10-CM | POA: Insufficient documentation

## 2020-12-30 HISTORY — PX: ESOPHAGOGASTRODUODENOSCOPY: SHX5428

## 2020-12-30 HISTORY — DX: Chronic kidney disease, unspecified: N18.9

## 2020-12-30 HISTORY — DX: Gastro-esophageal reflux disease without esophagitis: K21.9

## 2020-12-30 LAB — GLUCOSE, CAPILLARY: Glucose-Capillary: 165 mg/dL — ABNORMAL HIGH (ref 70–99)

## 2020-12-30 SURGERY — EGD (ESOPHAGOGASTRODUODENOSCOPY)
Anesthesia: General

## 2020-12-30 MED ORDER — PHENYLEPHRINE HCL (PRESSORS) 10 MG/ML IV SOLN
INTRAVENOUS | Status: AC
  Filled 2020-12-30: qty 1

## 2020-12-30 MED ORDER — PROPOFOL 10 MG/ML IV BOLUS
INTRAVENOUS | Status: AC
  Filled 2020-12-30: qty 40

## 2020-12-30 MED ORDER — PROPOFOL 10 MG/ML IV BOLUS
INTRAVENOUS | Status: DC | PRN
  Administered 2020-12-30 (×4): 20 mg via INTRAVENOUS
  Administered 2020-12-30: 50 mg via INTRAVENOUS
  Administered 2020-12-30: 20 mg via INTRAVENOUS

## 2020-12-30 MED ORDER — SODIUM CHLORIDE 0.9 % IV SOLN: INTRAVENOUS | Status: DC

## 2020-12-30 NOTE — Anesthesia Preprocedure Evaluation (Addendum)
Anesthesia Evaluation  Patient identified by MRN, date of birth, ID band Patient awake    Reviewed: Allergy & Precautions, NPO status , Patient's Chart, lab work & pertinent test results  History of Anesthesia Complications Negative for: history of anesthetic complications  Airway Mallampati: IV   Neck ROM: Full    Dental   Upper bridge:   Pulmonary asthma ,    Pulmonary exam normal breath sounds clear to auscultation       Cardiovascular hypertension, Normal cardiovascular exam Rhythm:Regular Rate:Normal  ECG 11/10/20: NSR, no STEMI   Neuro/Psych negative neurological ROS     GI/Hepatic negative GI ROS,   Endo/Other  diabetes, Type 2  Renal/GU Renal disease (stage III CKD)     Musculoskeletal   Abdominal   Peds  Hematology negative hematology ROS (+)   Anesthesia Other Findings Cardiology note 11/25/20:  Assessment   80 y.o. female with  1. Essential hypertension  2. SOB (shortness of breath) on exertion  3. Pure hypercholesterolemia  4. Type 2 diabetes mellitus with stage 3b chronic kidney disease, without long-term current use of insulin (CMS-HCC)  5. Stage 3b chronic kidney disease (CMS-HCC)   80 year old female initially referred for exertional dyspnea, fatigue and intermittent left-sided chest discomfort. Stress echocardiogram revealed normal left ventricular function, without evidence for scar or ischemia. The patient was seen 10/28/2020 at which time she reported episodes of shortness of breath, which occur sporadically with and without exertion. She returns today, reports feeling much better after changing her diet, lost 6 pounds, started walking regularly. 7-day Holter monitor revealed predominant sinus bradycardia, with mean heart rate of 59 bpm, heart rate range 39 to 188 bpm, occasional PVCs and PACs, with rare atrial runs. The patient has essential hypertension, blood pressure well controlled on current BP  medications.  Plan   1. Continue current medications 2. Counseled patient about low-sodium diet 3. DASH diet printed instructions given to the patient 4. Counseled patient about low-cholesterol diet 5. Continue atorvastatin for hyperlipidemia adjuvant 6. Return to clinic for follow-up in 66-months  No orders of the defined types were placed in this encounter.  Return in about 4 months (around 03/27/2021).   Reproductive/Obstetrics                            Anesthesia Physical Anesthesia Plan  ASA: 3  Anesthesia Plan: General   Post-op Pain Management:    Induction: Intravenous  PONV Risk Score and Plan: 3 and Propofol infusion, TIVA and Treatment may vary due to age or medical condition  Airway Management Planned: Natural Airway  Additional Equipment:   Intra-op Plan:   Post-operative Plan:   Informed Consent: I have reviewed the patients History and Physical, chart, labs and discussed the procedure including the risks, benefits and alternatives for the proposed anesthesia with the patient or authorized representative who has indicated his/her understanding and acceptance.       Plan Discussed with: CRNA  Anesthesia Plan Comments:        Anesthesia Quick Evaluation

## 2020-12-30 NOTE — Interval H&P Note (Signed)
History and Physical Interval Note: Preprocedure H&P from 12/30/20  was reviewed and there was no interval change after seeing and examining the patient.  Written consent was obtained from the patient after discussion of risks, benefits, and alternatives. Patient has consented to proceed with Esophagogastroduodenoscopy with possible intervention   12/30/2020 7:43 AM  Felicia Roberts  has presented today for surgery, with the diagnosis of dysphagia.  The various methods of treatment have been discussed with the patient and family. After consideration of risks, benefits and other options for treatment, the patient has consented to  Procedure(s): ESOPHAGOGASTRODUODENOSCOPY (EGD) (N/A) as a surgical intervention.  The patient's history has been reviewed, patient examined, no change in status, stable for surgery.  I have reviewed the patient's chart and labs.  Questions were answered to the patient's satisfaction.     Annamaria Helling

## 2020-12-30 NOTE — H&P (Signed)
[facility] Pre-Procedure H&P   Patient ID: Felicia Roberts is a 80 y.o. female.  Gastroenterology Provider: Annamaria Helling, DO  Referring Provider: Laurine Blazer, PA PCP: Tracie Harrier, MD  Date: 12/30/2020  HPI [name] is a 80 y.o. female who presents today for Esophagogastroduodenoscopy for gerd, dysphagia, abnormal weight loss.  Increasing frequency of dysphagia- solids and pills, no liquids. Suprasternal sticking. Now blending food. No heartburn n/v. Denies abdominal pain and early satiety. Has lost 16 pounds unintentionally. Developing food aversion.   No other acute gi complaints.  Past Medical History:  Diagnosis Date   Asthma    Chicken pox    Chronic kidney disease    Colon polyps    Diabetes mellitus without complication ([place])    Diverticulosis    GERD (gastroesophageal reflux disease)    Hyperlipidemia    Hypertension     Past Surgical History:  Procedure Laterality Date   BLADDER SURGERY     COLONOSCOPY WITH PROPOFOL N/A 09/25/2017   Procedure: COLONOSCOPY WITH PROPOFOL;  Surgeon: Toledo, Benay Pike, MD;  Location: [facility] ENDOSCOPY;  Service: Gastroenterology;  Laterality: N/A;   ESOPHAGOGASTRODUODENOSCOPY (EGD) WITH PROPOFOL N/A 09/25/2017   Procedure: ESOPHAGOGASTRODUODENOSCOPY (EGD) WITH PROPOFOL;  Surgeon: Toledo, Benay Pike, MD;  Location: [facility] ENDOSCOPY;  Service: Gastroenterology;  Laterality: N/A;   EYE SURGERY     URETHRAL DIVERTICULUM REPAIR  1980    Family History No h/o GI disease or malignancy  Review of Systems  Constitutional:  Positive for unexpected weight change. Negative for activity change, appetite change, fatigue and fever.  HENT:  Positive for trouble swallowing. Negative for voice change.   Respiratory:  Negative for shortness of breath and wheezing.   Cardiovascular:  Negative for chest pain and palpitations.  Gastrointestinal:  Negative for abdominal distention, abdominal pain, anal bleeding,  blood in stool, constipation, diarrhea, nausea, rectal pain and vomiting.  Musculoskeletal:  Negative for arthralgias and myalgias.  Skin:  Negative for color change and pallor.  Neurological:  Negative for dizziness, syncope and weakness.  Psychiatric/Behavioral:  Negative for confusion.   All other systems reviewed and are negative.   Medications No current facility-administered medications on file prior to encounter.   Current Outpatient Medications on File Prior to Encounter  Medication Sig Dispense Refill   albuterol (PROVENTIL) (2.5 MG/3ML) 0.083% nebulizer solution Take 3 mLs (2.5 mg total) by nebulization every 6 (six) hours as needed for wheezing or shortness of breath. 150 mL 1   amLODipine (NORVASC) 5 MG tablet TAKE 1 TABLET BY MOUTH ONCE DAILY 90 tablet 0   atorvastatin (LIPITOR) 40 MG tablet Take 1 tablet (40 mg total) by mouth daily. 90 tablet 3   EUTHYROX 50 MCG tablet Take 50 mcg by mouth every morning.     gabapentin (NEURONTIN) 100 MG capsule Take 100 mg by mouth at bedtime.     glipiZIDE (GLUCOTROL XL) 5 MG 24 hr tablet Take 5 mg by mouth daily.     lisinopril (PRINIVIL,ZESTRIL) 20 MG tablet TAKE 1 TABLET BY MOUTH ONCE DAILY 90 tablet 0   metFORMIN (GLUCOPHAGE-XR) 500 MG 24 hr tablet Take 1,000 mg by mouth daily after supper.      pioglitazone (ACTOS) 30 MG tablet Take 30 mg by mouth daily.     ADVAIR HFA 115-21 MCG/ACT inhaler Inhale 2 puffs into the lungs every 12 (twelve) hours.     albuterol (PROVENTIL HFA;VENTOLIN HFA) 108 (90 Base) MCG/ACT inhaler INHALE ONE TO TWO PUFFS BY MOUTH EVERY 6  HOURS AS NEEDED FOR WHEEZING OR  SHORTNESS  OF  BREATH 18 each 6    Pertinent medications related to GI and procedure were reviewed by me with the patient prior to the procedure   Current Facility-Administered Medications:    0.9 %  sodium chloride infusion, , Intravenous, Continuous, Annamaria Helling, DO, Last Rate: 20 mL/hr at 12/30/20 0710, New Bag at 12/30/20 0710   sodium chloride 20 mL/hr at 12/30/20 0710       Allergies  Allergen Reactions   Wilder Glade [Dapagliflozin] Anaphylaxis   Toviaz [Fesoterodine Fumarate Er] Other (See Comments)    Memory loss   Januvia [Sitagliptin] Rash   Allergies were reviewed by me prior to the procedure  Objective    Vitals:   12/30/20 0658  BP: (!) 147/79  Pulse: 74  Resp: 18  Temp: (!) 97.2 F (36.2 C)  TempSrc: Temporal  SpO2: 100%  Weight: 53.1 kg  Height: 5' (1.524 m)     Physical Exam Vitals reviewed.  Constitutional:      General: She is not in acute distress.    Appearance: Normal appearance. She is not ill-appearing, toxic-appearing or diaphoretic.  HENT:     Head: Normocephalic and atraumatic.     Nose: Nose normal.     Mouth/Throat:     Mouth: Mucous membranes are moist.     Pharynx: Oropharynx is clear.  Eyes:     General: No scleral icterus.    Extraocular Movements: Extraocular movements intact.  Cardiovascular:     Rate and Rhythm: Normal rate and regular rhythm.     Heart sounds: Normal heart sounds. No murmur heard.   No friction rub. No gallop.  Pulmonary:     Effort: Pulmonary effort is normal. No respiratory distress.     Breath sounds: Normal breath sounds. No wheezing, rhonchi or rales.  Abdominal:     General: Abdomen is flat. Bowel sounds are normal. There is no distension.     Palpations: Abdomen is soft.     Tenderness: There is no abdominal tenderness. There is no guarding or rebound.  Musculoskeletal:     Cervical back: Neck supple.     Right lower leg: No edema.     Left lower leg: No edema.  Skin:    General: Skin is warm and dry.     Coloration: Skin is not jaundiced or pale.  Neurological:     General: No focal deficit present.     Mental Status: She is alert and oriented to person, place, and time. Mental status is at baseline.  Psychiatric:        Mood and Affect: Mood normal.        Behavior: Behavior normal.        Thought Content: Thought  content normal.        Judgment: Judgment normal.    Assessment:  [name] is a 80 y.o. female  who presents today for Esophagogastroduodenoscopy for gerd and dysphagia, weight loss.  Plan:  Esophagogastroduodenoscopy with possible intervention today  Esophagogastroduodenoscopy with possible biopsy, control of bleeding, polypectomy, and interventions as necessary has been discussed with the patient/patient representative. Informed consent was obtained from the patient/patient representative after explaining the indication, nature, and risks of the procedure including but not limited to death, bleeding, perforation, missed neoplasm/lesions, cardiorespiratory compromise, and reaction to medications. Opportunity for questions was given and appropriate answers were provided. Patient/patient representative has verbalized understanding is amenable to undergoing the procedure.   Remo Lipps  Bernita Raisin, DO  [facility]  Portions of the record may have been created with voice recognition software. Occasional wrong-word or 'sound-a-like' substitutions may have occurred due to the inherent limitations of voice recognition software.  Read the chart carefully and recognize, using context, where substitutions may have occurred.

## 2020-12-30 NOTE — Transfer of Care (Signed)
Immediate Anesthesia Transfer of Care Note  Patient: Maleeha Halls  Procedure(s) Performed: ESOPHAGOGASTRODUODENOSCOPY (EGD)  Patient Location: PACU  Anesthesia Type:General  Level of Consciousness: drowsy  Airway & Oxygen Therapy: Patient Spontanous Breathing  Post-op Assessment: Report given to RN and Post -op Vital signs reviewed and stable  Post vital signs: Reviewed and stable  Last Vitals:  Vitals Value Taken Time  BP    Temp 35.6 C 12/30/20 0808  Pulse 33 12/30/20 0808  Resp 20 12/30/20 0808  SpO2 94 % 12/30/20 0808    Last Pain:  Vitals:   12/30/20 0808  TempSrc: Temporal  PainSc:          Complications: No notable events documented.

## 2020-12-30 NOTE — Op Note (Signed)
[facility] Patient Name: Felicia Roberts Procedure Date: 12/30/2020 7:13 AM MRN: [id number] Account #: [id number] Date of Birth: December 05, 1940 Admit Type: Outpatient Age: 80 Room: [facility] ENDO ROOM 1 Gender: Female Note Status: Finalized Instrument Name: Upper Endoscope [id number] Procedure:             Upper GI endoscopy Indications:           Dysphagia Providers:             Annamaria Helling DO, DO Referring MD:          [facility], dr (Referring MD) Medicines:             Monitored Anesthesia Care Complications:         No immediate complications. Estimated blood loss:                         Minimal. Procedure:             Pre-Anesthesia Assessment:                        - Prior to the procedure, a History and Physical was                         performed, and patient medications and allergies were                         reviewed. The patient is competent. The risks and                         benefits of the procedure and the sedation options and                         risks were discussed with the patient. All questions                         were answered and informed consent was obtained.                         Patient identification and proposed procedure were                         verified by the physician, the nurse, the anesthetist                         and the technician in the endoscopy suite. Mental                         Status Examination: alert and oriented. Airway                         Examination: normal oropharyngeal airway and neck                         mobility. Respiratory Examination: clear to                         auscultation. CV Examination: RRR, no murmurs, no S3  or S4. Prophylactic Antibiotics: The patient does not                         require prophylactic antibiotics. Prior                         Anticoagulants: The patient has taken no previous                          anticoagulant or antiplatelet agents. ASA Grade                         Assessment: III - A patient with severe systemic                         disease. After reviewing the risks and benefits, the                         patient was deemed in satisfactory condition to                         undergo the procedure. The anesthesia plan was to use                         monitored anesthesia care (MAC). Immediately prior to                         administration of medications, the patient was                         re-assessed for adequacy to receive sedatives. The                         heart rate, respiratory rate, oxygen saturations,                         blood pressure, adequacy of pulmonary ventilation, and                         response to care were monitored throughout the                         procedure. The physical status of the patient was                         re-assessed after the procedure.                        After obtaining informed consent, the endoscope was                         passed under direct vision. Throughout the procedure,                         the patient's blood pressure, pulse, and oxygen                         saturations were monitored continuously. The Endoscope  was introduced through the mouth, and advanced to the                         second part of duodenum. The upper GI endoscopy was                         accomplished without difficulty. The patient tolerated                         the procedure well. Findings:      The duodenal bulb, first portion of the duodenum and second portion of       the duodenum were normal. Estimated blood loss: none.      A single localized 2 mm erosion with no bleeding and no stigmata of       recent bleeding was found on the lesser curvature of the stomach.       Biopsies were taken with a cold forceps for histology. Estimated blood       loss was minimal. Mucosa  was otherwise normal. previously biopsied for h       pylori and negative, repeat biopsies were not taken.      A small hiatal hernia was present. Estimated blood loss was minimal.      The Z-line was regular and was found 35 cm from the incisors. Estimated       blood loss: none.      Esophagogastric landmarks were identified: the Z-line was found at 35 cm       from the incisors.      Normal mucosa was found in the entire esophagus. Biopsies were taken       with a cold forceps for histology to rule out EOE. Estimated blood loss       was minimal.      Esophagus was widely patent. No gross lesions were noted in the entire       esophagus. The scope was withdrawn. Dilation was performed with a       Maloney dilator with no resistance at 52 Fr. The dilation site was       examined following endoscope reinsertion and showed no change. Estimated       blood loss: none. Impression:            - Normal duodenal bulb, first portion of the duodenum                         and second portion of the duodenum.                        - Erosive gastropathy with no bleeding and no stigmata                         of recent bleeding. Biopsied.                        - Small hiatal hernia.                        - Z-line regular, 35 cm from the incisors.                        - Esophagogastric landmarks identified.                        -  Normal mucosa was found in the entire esophagus.                         Biopsied.                        - No gross lesions in esophagus. Dilated. Recommendation:        - Discharge patient to home.                        - Resume previous diet.                        - Continue present medications.                        - Await pathology results.                        - Return to referring physician as previously                         scheduled. Procedure Code(s):     --- Professional ---                        [phone], Esophagogastroduodenoscopy, flexible,                          transoral; with biopsy, single or multiple                        [id number], Dilation of esophagus, by unguided sound or                         bougie, single or multiple passes Diagnosis Code(s):     --- Professional ---                        K31.89, Other diseases of stomach and duodenum                        K44.9, Diaphragmatic hernia without obstruction or                         gangrene                        R13.10, Dysphagia, unspecified CPT copyright 2019 [employer]. All rights reserved. The codes documented in this report are preliminary and upon coder review may  be revised to meet current compliance requirements. Attending Participation:      I personally performed the entire procedure. Volney American, DO Annamaria Helling DO, DO 12/30/2020 8:09:58 AM This report has been signed electronically. Number of Addenda: 0 Note Initiated On: 12/30/2020 7:13 AM Estimated Blood Loss:  Estimated blood loss was minimal.      [facility]

## 2020-12-30 NOTE — Anesthesia Postprocedure Evaluation (Signed)
Anesthesia Post Note  Patient: Felicia Roberts  Procedure(s) Performed: ESOPHAGOGASTRODUODENOSCOPY (EGD)  Patient location during evaluation: PACU Anesthesia Type: General Level of consciousness: awake and alert, oriented and patient cooperative Pain management: pain level controlled Vital Signs Assessment: post-procedure vital signs reviewed and stable Respiratory status: spontaneous breathing, nonlabored ventilation and respiratory function stable Cardiovascular status: blood pressure returned to baseline and stable Postop Assessment: adequate PO intake Anesthetic complications: no   No notable events documented.   Last Vitals:  Vitals:   12/30/20 0820 12/30/20 0833  BP: 125/64 133/61  Pulse: (!) 54 (!) 52  Resp: 18 10  Temp:    SpO2: 99% 99%    Last Pain:  Vitals:   12/30/20 0808  TempSrc: Temporal  PainSc:                  Darrin Nipper

## 2021-01-02 ENCOUNTER — Encounter: Payer: Self-pay | Admitting: Gastroenterology

## 2021-01-02 LAB — SURGICAL PATHOLOGY

## 2021-02-28 ENCOUNTER — Other Ambulatory Visit: Payer: Self-pay | Admitting: Internal Medicine

## 2021-02-28 DIAGNOSIS — Z1231 Encounter for screening mammogram for malignant neoplasm of breast: Secondary | ICD-10-CM

## 2021-03-07 ENCOUNTER — Ambulatory Visit
Admission: RE | Admit: 2021-03-07 | Discharge: 2021-03-07 | Disposition: A | Discharge status: Home / Self Care | Payer: Medicare HMO | Source: Ambulatory Visit | Attending: Internal Medicine | Admitting: Internal Medicine

## 2021-03-07 ENCOUNTER — Other Ambulatory Visit: Payer: Self-pay

## 2021-03-07 DIAGNOSIS — Z1231 Encounter for screening mammogram for malignant neoplasm of breast: Secondary | ICD-10-CM | POA: Diagnosis present

## 2021-03-13 ENCOUNTER — Ambulatory Visit: Payer: Self-pay | Admitting: General Surgery

## 2021-03-13 NOTE — H&P (Signed)
PATIENT PROFILE: Felicia Roberts is a 80 y.o. female who presents to the Clinic for consultation at the request of Dr. Ginette Pitman for evaluation of temporal artery biopsy.  PCP:  Azzie Glatter, MD  HISTORY OF PRESENT ILLNESS: Felicia Roberts reports she has been feeling left-sided headache since a year ago.  She endorses that the headache is just only on the left side on the temporal area.  Pain does not radiate to other part of the body.  Patient also endorses seeing double.  She denies loss of vision.  She denies any aura.  She denies any nausea or vomiting.  She was evaluated by her PCP.  She was found with elevated sed rate.  She endorses that she was previously evaluated by ophthalmologist since initially she described the pain as periocular pain.  She endorses that her ophthalmologist has not found any explanation for her left-sided temporal headaches.   PROBLEM LIST: Problem List  Date Reviewed: 11/23/2020          Noted   Aortic atherosclerosis (CMS-HCC) 01/20/2020   Osteopenia 01/20/2020   SOB (shortness of breath) on exertion 05/29/2019   Chest pain with high risk for cardiac etiology 05/29/2019   Type 2 diabetes mellitus with hyperglycemia, with long-term current use of insulin (CMS-HCC) 11/28/2018   Uncontrolled type 2 diabetes mellitus with hypoglycemia without coma (CMS-HCC) 06/13/2018   Type 2 diabetes mellitus with stage 3 chronic kidney disease, without long-term current use of insulin (CMS-HCC) 06/13/2018   CKD (chronic kidney disease) stage 3, GFR 30-59 ml/min (CMS-HCC) 04/24/2018   Essential hypertension 04/24/2018   Pure hypercholesterolemia 04/24/2018   Moderate persistent asthma without complication 5/62/1308   Perimenopausal atrophic vaginitis 04/24/2018   Atypical chest pain 10/18/2017   Overview    Last Assessment & Plan:  Patient with typical and atypical features.  Symptoms could be cardiac related and be an atypical presentation.  Could be reflux related.  Could  be muscular.  Unlikely VTE given stable vital signs and lack of risk factors.  She has been asymptomatic over the last 2 days.  We will refer to cardiology for evaluation given her risk factors and family history.  She is given return precautions.      Excessive cerumen in both ear canals 10/18/2017   Overview    Last Assessment & Plan:  None at this time.  Discussed Debrox for cerumen buildup.      Tinnitus of both ears 07/19/2017   Overview    Last Assessment & Plan:  Refer to ENT.  They can also evaluate her cerumen issues.      History of colon polyps 01/18/2017   Overview    Last Assessment & Plan:  Refer to GI to consider repeat colonoscopy.      Epigastric pain 04/06/2016   Overview    Last Assessment & Plan:  She saw GI.  It appears she was supposed to have a H. pylori stool test though has not completed this.  She will try to locate the order for this and if she cannot find it she will contact GI.      Uncontrolled type 2 diabetes mellitus with hyperglycemia, without long-term current use of insulin (CMS-HCC) 10/14/2015   Constipation 06/20/2015   Overview    Last Assessment & Plan:  Patient with chronic issues with constipation.  I suspect this is contributing to her discomfort as it improves following a bowel movement.  She has passed some odd material on a couple of occasions  and a picture is above.  I will send this note to her GI physician to get their input on this.  The patient was advised if she did not hear from [place] regarding what the GI physician says by next week she is to contact us.      Asthma 04/18/2015   Overview    Last Assessment & Plan:  Well-controlled on Qvar.  We will refill this and if need to do a prior authorization.      Hyperlipidemia 04/18/2015   Overview    Last Assessment & Plan:  Continue Lipitor.       GENERAL REVIEW OF SYSTEMS:   General ROS: negative for - chills, fatigue, fever, weight gain or weight loss Allergy and Immunology  ROS: negative for - hives  Hematological and Lymphatic ROS: negative for - bleeding problems or bruising, negative for palpable nodes Endocrine ROS: negative for - heat or cold intolerance, hair changes Respiratory ROS: negative for - cough, shortness of breath or wheezing Cardiovascular ROS: no chest pain or palpitations GI ROS: negative for nausea, vomiting, abdominal pain, diarrhea, constipation Musculoskeletal ROS: negative for - joint swelling or muscle pain Neurological ROS: negative for - confusion, syncope.  Positive for headaches Dermatological ROS: negative for pruritus and rash Psychiatric: negative for anxiety, depression, difficulty sleeping and memory loss  MEDICATIONS: Current Outpatient Medications  Medication Sig Dispense Refill   albuterol 90 mcg/actuation inhaler Inhale 2 inhalations into the lungs every 6 (six) hours as needed for Wheezing 18 g 5   amLODIPine (NORVASC) 5 MG tablet Take 1 tablet (5 mg total) by mouth once daily for 180 days 90 tablet 1   atorvastatin (LIPITOR) 20 MG tablet Take 1 tablet (20 mg total) by mouth once daily 90 tablet 1   blood glucose diagnostic (FREESTYLE LITE STRIPS) test strip by XX route 2 (two) times daily 200 each 11   cefUROXime (CEFTIN) 250 MG tablet Take 1 tablet (250 mg total) by mouth 2 (two) times daily for 10 days 20 tablet 0   clobetasoL (CLOBEX) 0.05 % shampoo Apply topically once daily Apply thin film to dry scalp; leave in place for 15 minutes, then add water and lather.  Rinse thoroughly. 118 mL 0   dapagliflozin (FARXIGA) 10 mg tablet Take 1 tablet (10 mg total) by mouth once daily 30 tablet 11   gabapentin (NEURONTIN) 100 MG capsule Take 1 capsule (100 mg total) by mouth nightly 90 capsule 1   glipiZIDE (GLUCOTROL XL) 5 MG XL tablet Take 1 tablet (5 mg total) by mouth once daily 90 tablet 3   levothyroxine (SYNTHROID) 25 MCG tablet Take 1 tablet (25 mcg total) by mouth once daily Take on an empty stomach with a glass of water  at least 30-60 minutes before breakfast. 90 tablet 1   lisinopriL (ZESTRIL) 20 MG tablet Take 1 tablet (20 mg total) by mouth once daily for 180 days 90 tablet 1   metFORMIN (GLUCOPHAGE-XR) 500 MG XR tablet Take 3 tablets (1,500 mg total) by mouth daily with dinner 270 tablet 3   pantoprazole (PROTONIX) 40 MG DR tablet Take 1 tablet (40 mg total) by mouth once daily 30 tablet 11   pioglitazone (ACTOS) 30 MG tablet Take 1 tablet (30 mg total) by mouth once daily 90 tablet 3   No current facility-administered medications for this visit.    ALLERGIES: Patient has no known allergies.  PAST MEDICAL HISTORY: Past Medical History:  Diagnosis Date   Asthma  CKD (chronic kidney disease) stage 3, GFR 30-59 ml/min (CMS-HCC)    Colon polyp    Diverticulitis    GERD (gastroesophageal reflux disease)    Hyperlipidemia    Hypertension    Type 2 diabetes mellitus (CMS-HCC)     PAST SURGICAL HISTORY: Past Surgical History:  Procedure Laterality Date   COLONOSCOPY  09/25/2017   Tubular adenoma/Hyperplastic colon polyp/No Repeat due to age/TKT   EGD  09/25/2017   Reflux esophagitis/No Repeat/TKT   EGD  12/30/2020   Normal EGD biopsies/No repeat/SMR   CATARACT EXTRACTION  in the nineties   start to has some problems   CATARACT EXTRACTION     COLONOSCOPY     uretral diverticulum       FAMILY HISTORY: Family History  Problem Relation Age of Onset   Diabetes Mother    Myocardial Infarction (Heart attack) Mother    Diabetes type II Mother    High blood pressure (Hypertension) Mother    Diabetes insipidus Sister    Glaucoma Sister    Hyperlipidemia (Elevated cholesterol) Sister    Myocardial Infarction (Heart attack) Brother    Stroke Brother        he died   Diabetes Brother    High blood pressure (Hypertension) Sister      SOCIAL HISTORY: Social History   Socioeconomic History   Marital status: Widowed  Tobacco Use   Smoking status: Never   Smokeless tobacco: Never    Tobacco comments:    Never Smoke in my life  Vaping Use   Vaping Use: Never used  Substance and Sexual Activity   Alcohol use: No   Drug use: Never   Sexual activity: Never    PHYSICAL EXAM: Vitals:   03/13/21 1555  BP: (!) 162/83  Pulse: 75   Body mass index is 23.83 kg/m. Weight: 55.3 kg (122 lb)   GENERAL: Alert, active, oriented x3  HEENT: Pupils equal reactive to light. Extraocular movements are intact. Sclera clear. Palpebral conjunctiva normal red color.Pharynx clear.  Palpable pulse on the left temporal area.  NECK: Supple with no palpable mass and no adenopathy.  LUNGS: Sound clear with no rales rhonchi or wheezes.  HEART: Regular rhythm S1 and S2 without murmur.  ABDOMEN: Soft and depressible, nontender with no palpable mass, no hepatomegaly.   EXTREMITIES: Well-developed well-nourished symmetrical with no dependent edema.  NEUROLOGICAL: Awake alert oriented, facial expression symmetrical, moving all extremities.  REVIEW OF DATA: I have reviewed the following data today: Office Visit on 03/08/2021  Component Date Value   Sedimentation Rate-Autom* 03/08/2021 42 (H)    Influenza A PCR 03/08/2021 Negative    Influenza B PCR 03/08/2021 Negative    RSV PCR 03/08/2021 Negative    SARS-CoV2 PCR 03/08/2021 Negative   Appointment on 03/01/2021  Component Date Value   WBC (White Blood Cell Co* 03/01/2021 5.1    RBC (Red Blood Cell Coun* 03/01/2021 4.55    Hemoglobin 03/01/2021 13.7    Hematocrit 03/01/2021 42.1    MCV (Mean Corpuscular Vo* 03/01/2021 92.5    MCH (Mean Corpuscular He* 03/01/2021 30.1    MCHC (Mean Corpuscular H* 03/01/2021 32.5    Platelet Count 03/01/2021 327    RDW-CV (Red Cell Distrib* 03/01/2021 13.7    MPV (Mean Platelet Volum* 03/01/2021 10.4    Neutrophils 03/01/2021 1.39 (L)    Lymphocytes 03/01/2021 2.49    Monocytes 03/01/2021 0.46    Eosinophils 03/01/2021 0.68 (H)    Basophils 03/01/2021 0.04    Neutrophil %  03/01/2021 27.5 (L)     Lymphocyte % 03/01/2021 49.2    Monocyte % 03/01/2021 9.1    Eosinophil % 03/01/2021 13.4 (H)    Basophil% 03/01/2021 0.8    Immature Granulocyte % 03/01/2021 0.0    Immature Granulocyte Cou* 03/01/2021 0.00    Glucose 03/01/2021 105    Sodium 03/01/2021 139    Potassium 03/01/2021 4.2    Chloride 03/01/2021 103    Carbon Dioxide (CO2) 03/01/2021 28.3    Urea Nitrogen (BUN) 03/01/2021 30 (H)    Creatinine 03/01/2021 1.3 (H)    Glomerular Filtration Ra* 03/01/2021 39 (L)    Calcium 03/01/2021 10.3    AST  03/01/2021 19    ALT  03/01/2021 9    Alk Phos (alkaline Phosp* 03/01/2021 77    Albumin 03/01/2021 4.5    Bilirubin, Total 03/01/2021 0.9    Protein, Total 03/01/2021 7.2    A/G Ratio 03/01/2021 1.7    Cholesterol, Total 03/01/2021 216 (H)    Triglyceride 03/01/2021 132    HDL (High Density Lipopr* 03/01/2021 87.3 (H)    LDL Calculated 03/01/2021 102    VLDL Cholesterol 03/01/2021 26    Cholesterol/HDL Ratio 03/01/2021 2.5    Creatinine, Random Urine 03/01/2021 87.2    Urine Albumin, Random 03/01/2021 131    Urine Albumin/Creatinine* 03/01/2021 150.2 (H)    Thyroid Stimulating Horm* 03/01/2021 5.736 (H)    Color 03/01/2021 Yellow    Clarity 03/01/2021 Clear    Specific Gravity 03/01/2021 1.015    pH, Urine 03/01/2021 6.0    Protein, Urinalysis 03/01/2021 Trace    Glucose, Urinalysis 03/01/2021 >=1000 (!)    Ketones, Urinalysis 03/01/2021 Negative    Blood, Urinalysis 03/01/2021 Negative    Nitrite, Urinalysis 03/01/2021 Negative    Leukocyte Esterase, Urin* 03/01/2021 Negative    White Blood Cells, Urina* 03/01/2021 None Seen    Red Blood Cells, Urinaly* 03/01/2021 None Seen    Bacteria, Urinalysis 03/01/2021 Rare (!)    Squamous Epithelial Cell* 03/01/2021 Rare      ASSESSMENT: Felicia Roberts is a 80 y.o. female presenting for consultation for temporal artery biopsy.  Patient with left temporal headache.  Dizziness related to change in her vision.  She endorses  double vision.  She denies loss of vision.  This is also associated with elevated sedimentation rate.  Left and prior to requested by PCP to rule out temporal arteritis.  Patient was oriented about the biopsy.  Patient was oriented about the goal of the biopsy studies to diagnose temporal arteritis.  Decision on the treatment for her headaches.  She was oriented about the risk of surgery that includes bleeding, infection, injury to adjacent structures such as nerves among others.  She endorses understood and agreed to proceed.  Temporal headache [R51.9]  PLAN: 1.  Left temporal artery biopsy (51071) 2.  CBC, CMP done 3.  Avoid taking aspirin before the procedure 4.  Contact us if you have any concern.  Patient verbalized understanding, all questions were answered, and were agreeable with the plan outlined above.     Herbert Pun, MD  Electronically signed by Herbert Pun, MD

## 2021-03-13 NOTE — H&P (View-Only) (Signed)
PATIENT PROFILE: Felicia Roberts is a 80 y.o. female who presents to the Clinic for consultation at the request of Dr. Ginette Pitman for evaluation of temporal artery biopsy.  PCP:  Azzie Glatter, MD  HISTORY OF PRESENT ILLNESS: Felicia Roberts reports she has been feeling left-sided headache since a year ago.  She endorses that the headache is just only on the left side on the temporal area.  Pain does not radiate to other part of the body.  Patient also endorses seeing double.  She denies loss of vision.  She denies any aura.  She denies any nausea or vomiting.  She was evaluated by her PCP.  She was found with elevated sed rate.  She endorses that she was previously evaluated by ophthalmologist since initially she described the pain as periocular pain.  She endorses that her ophthalmologist has not found any explanation for her left-sided temporal headaches.   PROBLEM LIST: Problem List  Date Reviewed: 11/23/2020          Noted   Aortic atherosclerosis (CMS-HCC) 01/20/2020   Osteopenia 01/20/2020   SOB (shortness of breath) on exertion 05/29/2019   Chest pain with high risk for cardiac etiology 05/29/2019   Type 2 diabetes mellitus with hyperglycemia, with long-term current use of insulin (CMS-HCC) 11/28/2018   Uncontrolled type 2 diabetes mellitus with hypoglycemia without coma (CMS-HCC) 06/13/2018   Type 2 diabetes mellitus with stage 3 chronic kidney disease, without long-term current use of insulin (CMS-HCC) 06/13/2018   CKD (chronic kidney disease) stage 3, GFR 30-59 ml/min (CMS-HCC) 04/24/2018   Essential hypertension 04/24/2018   Pure hypercholesterolemia 04/24/2018   Moderate persistent asthma without complication 09/12/5091   Perimenopausal atrophic vaginitis 04/24/2018   Atypical chest pain 10/18/2017   Overview    Last Assessment & Plan:  Patient with typical and atypical features.  Symptoms could be cardiac related and be an atypical presentation.  Could be reflux related.  Could  be muscular.  Unlikely VTE given stable vital signs and lack of risk factors.  She has been asymptomatic over the last 2 days.  We will refer to cardiology for evaluation given her risk factors and family history.  She is given return precautions.      Excessive cerumen in both ear canals 10/18/2017   Overview    Last Assessment & Plan:  None at this time.  Discussed Debrox for cerumen buildup.      Tinnitus of both ears 07/19/2017   Overview    Last Assessment & Plan:  Refer to ENT.  They can also evaluate her cerumen issues.      History of colon polyps 01/18/2017   Overview    Last Assessment & Plan:  Refer to GI to consider repeat colonoscopy.      Epigastric pain 04/06/2016   Overview    Last Assessment & Plan:  She saw GI.  It appears she was supposed to have a H. pylori stool test though has not completed this.  She will try to locate the order for this and if she cannot find it she will contact GI.      Uncontrolled type 2 diabetes mellitus with hyperglycemia, without long-term current use of insulin (CMS-HCC) 10/14/2015   Constipation 06/20/2015   Overview    Last Assessment & Plan:  Patient with chronic issues with constipation.  I suspect this is contributing to her discomfort as it improves following a bowel movement.  She has passed some odd material on a couple of occasions  and a picture is above.  I will send this note to her GI physician to get their input on this.  The patient was advised if she did not hear from [place] regarding what the GI physician says by next week she is to contact us.      Asthma 04/18/2015   Overview    Last Assessment & Plan:  Well-controlled on Qvar.  We will refill this and if need to do a prior authorization.      Hyperlipidemia 04/18/2015   Overview    Last Assessment & Plan:  Continue Lipitor.       GENERAL REVIEW OF SYSTEMS:   General ROS: negative for - chills, fatigue, fever, weight gain or weight loss Allergy and Immunology  ROS: negative for - hives  Hematological and Lymphatic ROS: negative for - bleeding problems or bruising, negative for palpable nodes Endocrine ROS: negative for - heat or cold intolerance, hair changes Respiratory ROS: negative for - cough, shortness of breath or wheezing Cardiovascular ROS: no chest pain or palpitations GI ROS: negative for nausea, vomiting, abdominal pain, diarrhea, constipation Musculoskeletal ROS: negative for - joint swelling or muscle pain Neurological ROS: negative for - confusion, syncope.  Positive for headaches Dermatological ROS: negative for pruritus and rash Psychiatric: negative for anxiety, depression, difficulty sleeping and memory loss  MEDICATIONS: Current Outpatient Medications  Medication Sig Dispense Refill   albuterol 90 mcg/actuation inhaler Inhale 2 inhalations into the lungs every 6 (six) hours as needed for Wheezing 18 g 5   amLODIPine (NORVASC) 5 MG tablet Take 1 tablet (5 mg total) by mouth once daily for 180 days 90 tablet 1   atorvastatin (LIPITOR) 20 MG tablet Take 1 tablet (20 mg total) by mouth once daily 90 tablet 1   blood glucose diagnostic (FREESTYLE LITE STRIPS) test strip by XX route 2 (two) times daily 200 each 11   cefUROXime (CEFTIN) 250 MG tablet Take 1 tablet (250 mg total) by mouth 2 (two) times daily for 10 days 20 tablet 0   clobetasoL (CLOBEX) 0.05 % shampoo Apply topically once daily Apply thin film to dry scalp; leave in place for 15 minutes, then add water and lather.  Rinse thoroughly. 118 mL 0   dapagliflozin (FARXIGA) 10 mg tablet Take 1 tablet (10 mg total) by mouth once daily 30 tablet 11   gabapentin (NEURONTIN) 100 MG capsule Take 1 capsule (100 mg total) by mouth nightly 90 capsule 1   glipiZIDE (GLUCOTROL XL) 5 MG XL tablet Take 1 tablet (5 mg total) by mouth once daily 90 tablet 3   levothyroxine (SYNTHROID) 25 MCG tablet Take 1 tablet (25 mcg total) by mouth once daily Take on an empty stomach with a glass of water  at least 30-60 minutes before breakfast. 90 tablet 1   lisinopriL (ZESTRIL) 20 MG tablet Take 1 tablet (20 mg total) by mouth once daily for 180 days 90 tablet 1   metFORMIN (GLUCOPHAGE-XR) 500 MG XR tablet Take 3 tablets (1,500 mg total) by mouth daily with dinner 270 tablet 3   pantoprazole (PROTONIX) 40 MG DR tablet Take 1 tablet (40 mg total) by mouth once daily 30 tablet 11   pioglitazone (ACTOS) 30 MG tablet Take 1 tablet (30 mg total) by mouth once daily 90 tablet 3   No current facility-administered medications for this visit.    ALLERGIES: Patient has no known allergies.  PAST MEDICAL HISTORY: Past Medical History:  Diagnosis Date   Asthma  CKD (chronic kidney disease) stage 3, GFR 30-59 ml/min (CMS-HCC)    Colon polyp    Diverticulitis    GERD (gastroesophageal reflux disease)    Hyperlipidemia    Hypertension    Type 2 diabetes mellitus (CMS-HCC)     PAST SURGICAL HISTORY: Past Surgical History:  Procedure Laterality Date   COLONOSCOPY  09/25/2017   Tubular adenoma/Hyperplastic colon polyp/No Repeat due to age/TKT   EGD  09/25/2017   Reflux esophagitis/No Repeat/TKT   EGD  12/30/2020   Normal EGD biopsies/No repeat/SMR   CATARACT EXTRACTION  in the nineties   start to has some problems   CATARACT EXTRACTION     COLONOSCOPY     uretral diverticulum       FAMILY HISTORY: Family History  Problem Relation Age of Onset   Diabetes Mother    Myocardial Infarction (Heart attack) Mother    Diabetes type II Mother    High blood pressure (Hypertension) Mother    Diabetes insipidus Sister    Glaucoma Sister    Hyperlipidemia (Elevated cholesterol) Sister    Myocardial Infarction (Heart attack) Brother    Stroke Brother        he died   Diabetes Brother    High blood pressure (Hypertension) Sister      SOCIAL HISTORY: Social History   Socioeconomic History   Marital status: Widowed  Tobacco Use   Smoking status: Never   Smokeless tobacco: Never    Tobacco comments:    Never Smoke in my life  Vaping Use   Vaping Use: Never used  Substance and Sexual Activity   Alcohol use: No   Drug use: Never   Sexual activity: Never    PHYSICAL EXAM: Vitals:   03/13/21 1555  BP: (!) 162/83  Pulse: 75   Body mass index is 23.83 kg/m. Weight: 55.3 kg (122 lb)   GENERAL: Alert, active, oriented x3  HEENT: Pupils equal reactive to light. Extraocular movements are intact. Sclera clear. Palpebral conjunctiva normal red color.Pharynx clear.  Palpable pulse on the left temporal area.  NECK: Supple with no palpable mass and no adenopathy.  LUNGS: Sound clear with no rales rhonchi or wheezes.  HEART: Regular rhythm S1 and S2 without murmur.  ABDOMEN: Soft and depressible, nontender with no palpable mass, no hepatomegaly.   EXTREMITIES: Well-developed well-nourished symmetrical with no dependent edema.  NEUROLOGICAL: Awake alert oriented, facial expression symmetrical, moving all extremities.  REVIEW OF DATA: I have reviewed the following data today: Office Visit on 03/08/2021  Component Date Value   Sedimentation Rate-Autom* 03/08/2021 42 (H)    Influenza A PCR 03/08/2021 Negative    Influenza B PCR 03/08/2021 Negative    RSV PCR 03/08/2021 Negative    SARS-CoV2 PCR 03/08/2021 Negative   Appointment on 03/01/2021  Component Date Value   WBC (White Blood Cell Co* 03/01/2021 5.1    RBC (Red Blood Cell Coun* 03/01/2021 4.55    Hemoglobin 03/01/2021 13.7    Hematocrit 03/01/2021 42.1    MCV (Mean Corpuscular Vo* 03/01/2021 92.5    MCH (Mean Corpuscular He* 03/01/2021 30.1    MCHC (Mean Corpuscular H* 03/01/2021 32.5    Platelet Count 03/01/2021 327    RDW-CV (Red Cell Distrib* 03/01/2021 13.7    MPV (Mean Platelet Volum* 03/01/2021 10.4    Neutrophils 03/01/2021 1.39 (L)    Lymphocytes 03/01/2021 2.49    Monocytes 03/01/2021 0.46    Eosinophils 03/01/2021 0.68 (H)    Basophils 03/01/2021 0.04    Neutrophil %  03/01/2021 27.5 (L)     Lymphocyte % 03/01/2021 49.2    Monocyte % 03/01/2021 9.1    Eosinophil % 03/01/2021 13.4 (H)    Basophil% 03/01/2021 0.8    Immature Granulocyte % 03/01/2021 0.0    Immature Granulocyte Cou* 03/01/2021 0.00    Glucose 03/01/2021 105    Sodium 03/01/2021 139    Potassium 03/01/2021 4.2    Chloride 03/01/2021 103    Carbon Dioxide (CO2) 03/01/2021 28.3    Urea Nitrogen (BUN) 03/01/2021 30 (H)    Creatinine 03/01/2021 1.3 (H)    Glomerular Filtration Ra* 03/01/2021 39 (L)    Calcium 03/01/2021 10.3    AST  03/01/2021 19    ALT  03/01/2021 9    Alk Phos (alkaline Phosp* 03/01/2021 77    Albumin 03/01/2021 4.5    Bilirubin, Total 03/01/2021 0.9    Protein, Total 03/01/2021 7.2    A/G Ratio 03/01/2021 1.7    Cholesterol, Total 03/01/2021 216 (H)    Triglyceride 03/01/2021 132    HDL (High Density Lipopr* 03/01/2021 87.3 (H)    LDL Calculated 03/01/2021 102    VLDL Cholesterol 03/01/2021 26    Cholesterol/HDL Ratio 03/01/2021 2.5    Creatinine, Random Urine 03/01/2021 87.2    Urine Albumin, Random 03/01/2021 131    Urine Albumin/Creatinine* 03/01/2021 150.2 (H)    Thyroid Stimulating Horm* 03/01/2021 5.736 (H)    Color 03/01/2021 Yellow    Clarity 03/01/2021 Clear    Specific Gravity 03/01/2021 1.015    pH, Urine 03/01/2021 6.0    Protein, Urinalysis 03/01/2021 Trace    Glucose, Urinalysis 03/01/2021 >=1000 (!)    Ketones, Urinalysis 03/01/2021 Negative    Blood, Urinalysis 03/01/2021 Negative    Nitrite, Urinalysis 03/01/2021 Negative    Leukocyte Esterase, Urin* 03/01/2021 Negative    White Blood Cells, Urina* 03/01/2021 None Seen    Red Blood Cells, Urinaly* 03/01/2021 None Seen    Bacteria, Urinalysis 03/01/2021 Rare (!)    Squamous Epithelial Cell* 03/01/2021 Rare      ASSESSMENT: Ms. Michaelson is a 80 y.o. female presenting for consultation for temporal artery biopsy.  Patient with left temporal headache.  Dizziness related to change in her vision.  She endorses  double vision.  She denies loss of vision.  This is also associated with elevated sedimentation rate.  Left and prior to requested by PCP to rule out temporal arteritis.  Patient was oriented about the biopsy.  Patient was oriented about the goal of the biopsy studies to diagnose temporal arteritis.  Decision on the treatment for her headaches.  She was oriented about the risk of surgery that includes bleeding, infection, injury to adjacent structures such as nerves among others.  She endorses understood and agreed to proceed.  Temporal headache [R51.9]  PLAN: 1.  Left temporal artery biopsy (51071) 2.  CBC, CMP done 3.  Avoid taking aspirin before the procedure 4.  Contact us if you have any concern.  Patient verbalized understanding, all questions were answered, and were agreeable with the plan outlined above.     Herbert Pun, MD  Electronically signed by Herbert Pun, MD

## 2021-03-15 ENCOUNTER — Encounter
Admission: RE | Disposition: A | Discharge status: Home / Self Care | Payer: Self-pay | Source: Home / Self Care | Attending: General Surgery

## 2021-03-15 ENCOUNTER — Encounter: Payer: Self-pay | Admitting: General Surgery

## 2021-03-15 ENCOUNTER — Ambulatory Visit: Payer: Medicare HMO | Admitting: Anesthesiology

## 2021-03-15 ENCOUNTER — Other Ambulatory Visit: Payer: Self-pay

## 2021-03-15 ENCOUNTER — Ambulatory Visit
Admission: RE | Admit: 2021-03-15 | Discharge: 2021-03-15 | Disposition: A | Discharge status: Home / Self Care | Payer: Medicare HMO | Attending: General Surgery | Admitting: General Surgery

## 2021-03-15 DIAGNOSIS — I10 Essential (primary) hypertension: Secondary | ICD-10-CM | POA: Diagnosis not present

## 2021-03-15 DIAGNOSIS — N1831 Chronic kidney disease, stage 3a: Secondary | ICD-10-CM

## 2021-03-15 DIAGNOSIS — I672 Cerebral atherosclerosis: Secondary | ICD-10-CM | POA: Insufficient documentation

## 2021-03-15 DIAGNOSIS — E119 Type 2 diabetes mellitus without complications: Secondary | ICD-10-CM | POA: Insufficient documentation

## 2021-03-15 DIAGNOSIS — Z7984 Long term (current) use of oral hypoglycemic drugs: Secondary | ICD-10-CM | POA: Insufficient documentation

## 2021-03-15 DIAGNOSIS — J45909 Unspecified asthma, uncomplicated: Secondary | ICD-10-CM | POA: Diagnosis not present

## 2021-03-15 HISTORY — PX: ARTERY BIOPSY: SHX891

## 2021-03-15 LAB — GLUCOSE, CAPILLARY
Glucose-Capillary: 204 mg/dL — ABNORMAL HIGH (ref 70–99)
Glucose-Capillary: 173 mg/dL — ABNORMAL HIGH (ref 70–99)

## 2021-03-15 SURGERY — BIOPSY TEMPORAL ARTERY
Anesthesia: General | Site: Head | Laterality: Left

## 2021-03-15 MED ORDER — SODIUM CHLORIDE 0.9 % IV SOLN: INTRAVENOUS | Status: DC

## 2021-03-15 MED ORDER — LIDOCAINE-EPINEPHRINE 1 %-1:100000 IJ SOLN
INTRAMUSCULAR | Status: DC | PRN
  Administered 2021-03-15: 4 mL

## 2021-03-15 MED ORDER — FENTANYL CITRATE (PF) 100 MCG/2ML IJ SOLN: 25.0000 ug | INTRAMUSCULAR | Status: DC | PRN

## 2021-03-15 MED ORDER — PROPOFOL 500 MG/50ML IV EMUL
INTRAVENOUS | Status: AC
  Filled 2021-03-15: qty 50

## 2021-03-15 MED ORDER — FENTANYL CITRATE (PF) 100 MCG/2ML IJ SOLN
INTRAMUSCULAR | Status: DC | PRN
  Administered 2021-03-15 (×3): 25 ug via INTRAVENOUS

## 2021-03-15 MED ORDER — HYDROCODONE-ACETAMINOPHEN 5-325 MG PO TABS: 1.0000 | ORAL_TABLET | ORAL | 0 refills | Status: AC | PRN

## 2021-03-15 MED ORDER — CHLORHEXIDINE GLUCONATE 0.12 % MT SOLN: 15.0000 mL | Freq: Once | OROMUCOSAL | Status: AC

## 2021-03-15 MED ORDER — ONDANSETRON HCL 4 MG/2ML IJ SOLN: 4.0000 mg | Freq: Once | INTRAMUSCULAR | Status: DC | PRN

## 2021-03-15 MED ORDER — EPHEDRINE SULFATE 50 MG/ML IJ SOLN
INTRAMUSCULAR | Status: DC | PRN
  Administered 2021-03-15 (×4): 5 mg via INTRAVENOUS

## 2021-03-15 MED ORDER — CHLORHEXIDINE GLUCONATE 0.12 % MT SOLN
OROMUCOSAL | Status: AC
  Administered 2021-03-15: 09:00:00 15 mL via OROMUCOSAL
  Filled 2021-03-15: qty 15

## 2021-03-15 MED ORDER — FENTANYL CITRATE (PF) 100 MCG/2ML IJ SOLN
INTRAMUSCULAR | Status: AC
  Filled 2021-03-15: qty 2

## 2021-03-15 MED ORDER — CEFAZOLIN SODIUM-DEXTROSE 2-4 GM/100ML-% IV SOLN
2.0000 g | INTRAVENOUS | Status: AC
  Administered 2021-03-15: 11:00:00 2 g via INTRAVENOUS

## 2021-03-15 MED ORDER — ORAL CARE MOUTH RINSE: 15.0000 mL | Freq: Once | OROMUCOSAL | Status: AC

## 2021-03-15 MED ORDER — PROPOFOL 500 MG/50ML IV EMUL
INTRAVENOUS | Status: DC | PRN
  Administered 2021-03-15: 100 ug/kg/min via INTRAVENOUS

## 2021-03-15 MED ORDER — LIDOCAINE-EPINEPHRINE 1 %-1:100000 IJ SOLN
INTRAMUSCULAR | Status: AC
  Filled 2021-03-15: qty 1

## 2021-03-15 MED ORDER — PROPOFOL 10 MG/ML IV BOLUS
INTRAVENOUS | Status: DC | PRN
  Administered 2021-03-15: 20 mg via INTRAVENOUS
  Administered 2021-03-15: 60 mg via INTRAVENOUS
  Administered 2021-03-15 (×2): 20 mg via INTRAVENOUS

## 2021-03-15 MED ORDER — LIDOCAINE HCL (PF) 2 % IJ SOLN
INTRAMUSCULAR | Status: AC
  Filled 2021-03-15: qty 5

## 2021-03-15 MED ORDER — ONDANSETRON HCL 4 MG/2ML IJ SOLN
INTRAMUSCULAR | Status: AC
  Filled 2021-03-15: qty 2

## 2021-03-15 MED ORDER — MIDAZOLAM HCL 2 MG/2ML IJ SOLN
INTRAMUSCULAR | Status: AC
  Filled 2021-03-15: qty 2

## 2021-03-15 MED ORDER — CEFAZOLIN SODIUM-DEXTROSE 2-4 GM/100ML-% IV SOLN
INTRAVENOUS | Status: AC
  Filled 2021-03-15: qty 100

## 2021-03-15 MED ORDER — PHENYLEPHRINE HCL (PRESSORS) 10 MG/ML IV SOLN
INTRAVENOUS | Status: DC | PRN
  Administered 2021-03-15 (×2): 80 ug via INTRAVENOUS

## 2021-03-15 MED ORDER — DEXMEDETOMIDINE (PRECEDEX) IN NS 20 MCG/5ML (4 MCG/ML) IV SYRINGE
PREFILLED_SYRINGE | INTRAVENOUS | Status: DC | PRN
  Administered 2021-03-15 (×2): 4 ug via INTRAVENOUS

## 2021-03-15 MED ORDER — 0.9 % SODIUM CHLORIDE (POUR BTL) OPTIME
TOPICAL | Status: DC | PRN
  Administered 2021-03-15: 11:00:00 15 mL

## 2021-03-15 MED ORDER — LIDOCAINE HCL (CARDIAC) PF 100 MG/5ML IV SOSY
PREFILLED_SYRINGE | INTRAVENOUS | Status: DC | PRN
  Administered 2021-03-15: 40 mg via INTRAVENOUS

## 2021-03-15 SURGICAL SUPPLY — 46 items
ADH SKN CLS APL DERMABOND .7 (GAUZE/BANDAGES/DRESSINGS) ×1
BLADE SURG 15 STRL LF DISP TIS (BLADE) ×1 IMPLANT
BLADE SURG 15 STRL SS (BLADE) ×3
BLADE SURG SZ11 CARB STEEL (BLADE) ×3 IMPLANT
CNTNR SPEC 2.5X3XGRAD LEK (MISCELLANEOUS)
CONT SPEC 4OZ STER OR WHT (MISCELLANEOUS)
CONT SPEC 4OZ STRL OR WHT (MISCELLANEOUS)
CONTAINER SPEC 2.5X3XGRAD LEK (MISCELLANEOUS) IMPLANT
COTTON BALL STRL MEDIUM (GAUZE/BANDAGES/DRESSINGS) ×3 IMPLANT
DERMABOND ADVANCED (GAUZE/BANDAGES/DRESSINGS) ×2
DERMABOND ADVANCED .7 DNX12 (GAUZE/BANDAGES/DRESSINGS) ×1 IMPLANT
DRAPE LAPAROTOMY 77X122 PED (DRAPES) ×3 IMPLANT
DRSG TELFA 4X3 1S NADH ST (GAUZE/BANDAGES/DRESSINGS) ×3 IMPLANT
ELECT CAUTERY BLADE 6.4 (BLADE) ×3 IMPLANT
ELECT REM PT RETURN 9FT ADLT (ELECTROSURGICAL) ×3
ELECTRODE REM PT RTRN 9FT ADLT (ELECTROSURGICAL) ×1 IMPLANT
GAUZE 4X4 16PLY ~~LOC~~+RFID DBL (SPONGE) ×3 IMPLANT
GLOVE SURG ENC MOIS LTX SZ6.5 (GLOVE) ×5 IMPLANT
GLOVE SURG UNDER POLY LF SZ6.5 (GLOVE) ×5 IMPLANT
GOWN STRL REUS W/ TWL LRG LVL3 (GOWN DISPOSABLE) ×1 IMPLANT
GOWN STRL REUS W/ TWL XL LVL3 (GOWN DISPOSABLE) ×2 IMPLANT
GOWN STRL REUS W/TWL LRG LVL3 (GOWN DISPOSABLE) ×3
GOWN STRL REUS W/TWL XL LVL3 (GOWN DISPOSABLE) ×3
KIT TURNOVER KIT A (KITS) ×3 IMPLANT
LABEL OR SOLS (LABEL) ×3 IMPLANT
MANIFOLD NEPTUNE II (INSTRUMENTS) ×3 IMPLANT
NDL HYPO 25X1 1.5 SAFETY (NEEDLE) IMPLANT
NEEDLE HYPO 25X1 1.5 SAFETY (NEEDLE) IMPLANT
NS IRRIG 500ML POUR BTL (IV SOLUTION) ×3 IMPLANT
PACK BASIN MINOR ARMC (MISCELLANEOUS) ×3 IMPLANT
SOL PREP PVP 2OZ (MISCELLANEOUS) ×3
SOLUTION PREP PVP 2OZ (MISCELLANEOUS) ×1 IMPLANT
SUCTION FRAZIER HANDLE 10FR (MISCELLANEOUS) ×2
SUCTION TUBE FRAZIER 10FR DISP (MISCELLANEOUS) ×1 IMPLANT
SUT MNCRL AB 4-0 PS2 18 (SUTURE) ×3 IMPLANT
SUT SILK 2 0 (SUTURE) ×3
SUT SILK 2-0 18XBRD TIE 12 (SUTURE) ×1 IMPLANT
SUT SILK 3 0 (SUTURE) ×3
SUT SILK 3-0 18XBRD TIE 12 (SUTURE) ×1 IMPLANT
SUT SILK 4 0 (SUTURE) ×3
SUT SILK 4-0 18XBRD TIE 12 (SUTURE) ×1 IMPLANT
SUT VIC AB 3-0 SH 27 (SUTURE) ×3
SUT VIC AB 3-0 SH 27X BRD (SUTURE) ×1 IMPLANT
SYR 10ML LL (SYRINGE) IMPLANT
SYR BULB IRRIG 60ML STRL (SYRINGE) ×3 IMPLANT
WATER STERILE IRR 500ML POUR (IV SOLUTION) ×3 IMPLANT

## 2021-03-15 NOTE — Transfer of Care (Signed)
Immediate Anesthesia Transfer of Care Note  Patient: Felicia Roberts  Procedure(s) Performed: BIOPSY TEMPORAL ARTERY (Left: Head)  Patient Location: PACU  Anesthesia Type:General  Level of Consciousness: awake, alert  and oriented  Airway & Oxygen Therapy: Patient Spontanous Breathing  Post-op Assessment: Report given to RN and Post -op Vital signs reviewed and stable  Post vital signs: Reviewed and stable  Last Vitals:  Vitals Value Taken Time  BP    Temp    Pulse 73 03/15/21 1127  Resp 17 03/15/21 1127  SpO2 95 % 03/15/21 1127  Vitals shown include unvalidated device data.  Last Pain:  Vitals:   03/15/21 0847  TempSrc: Oral  PainSc: 2          Complications: No notable events documented.

## 2021-03-15 NOTE — Anesthesia Preprocedure Evaluation (Signed)
Anesthesia Evaluation  Patient identified by MRN, date of birth, ID band Patient awake    Reviewed: Allergy & Precautions, NPO status , Patient's Chart, lab work & pertinent test results  Airway Mallampati: II  TM Distance: >3 FB Neck ROM: full    Dental  (+) Teeth Intact   Pulmonary neg pulmonary ROS, asthma ,    Pulmonary exam normal  + decreased breath sounds      Cardiovascular Exercise Tolerance: Good hypertension, Pt. on medications negative cardio ROS Normal cardiovascular exam Rhythm:Regular Rate:Normal     Neuro/Psych negative neurological ROS  negative psych ROS   GI/Hepatic negative GI ROS, Neg liver ROS, GERD  ,  Endo/Other  negative endocrine ROSdiabetes, Well Controlled, Type 2, Oral Hypoglycemic Agents  Renal/GU negative Renal ROS  negative genitourinary   Musculoskeletal negative musculoskeletal ROS (+)   Abdominal Normal abdominal exam  (+)   Peds negative pediatric ROS (+)  Hematology negative hematology ROS (+)   Anesthesia Other Findings Past Medical History: No date: Asthma No date: Chicken pox No date: Chronic kidney disease No date: Colon polyps No date: Diabetes mellitus without complication (HCC) No date: Diverticulosis No date: GERD (gastroesophageal reflux disease) No date: Hyperlipidemia No date: Hypertension  Past Surgical History: No date: BLADDER SURGERY 09/25/2017: COLONOSCOPY WITH PROPOFOL; N/A     Comment:  Procedure: COLONOSCOPY WITH PROPOFOL;  Surgeon: Toledo,               Benay Pike, MD;  Location: [facility] ENDOSCOPY;  Service:               Gastroenterology;  Laterality: N/A; 12/30/2020: ESOPHAGOGASTRODUODENOSCOPY; N/A     Comment:  Procedure: ESOPHAGOGASTRODUODENOSCOPY (EGD);  Surgeon:               Annamaria Helling, DO;  Location: [facility] ENDOSCOPY;                Service: Gastroenterology;  Laterality: N/A; 09/25/2017: ESOPHAGOGASTRODUODENOSCOPY (EGD) WITH  PROPOFOL; N/A     Comment:  Procedure: ESOPHAGOGASTRODUODENOSCOPY (EGD) WITH               PROPOFOL;  Surgeon: Toledo, Benay Pike, MD;  Location:               [facility] ENDOSCOPY;  Service: Gastroenterology;  Laterality:               N/A; No date: EYE SURGERY 1980: URETHRAL DIVERTICULUM REPAIR  BMI    Body Mass Index: 23.05 kg/m      Reproductive/Obstetrics negative OB ROS                             Anesthesia Physical Anesthesia Plan  ASA: 3  Anesthesia Plan: General   Post-op Pain Management:    Induction: Intravenous  PONV Risk Score and Plan: Propofol infusion and TIVA  Airway Management Planned: Natural Airway and LMA  Additional Equipment:   Intra-op Plan:   Post-operative Plan:   Informed Consent: I have reviewed the patients History and Physical, chart, labs and discussed the procedure including the risks, benefits and alternatives for the proposed anesthesia with the patient or authorized representative who has indicated his/her understanding and acceptance.     Dental Advisory Given  Plan Discussed with: CRNA and Surgeon  Anesthesia Plan Comments:         Anesthesia Quick Evaluation

## 2021-03-15 NOTE — Progress Notes (Addendum)
Notified Dr Boston Service of patients CBG of 204.  He acknowledged, no new orders given.

## 2021-03-15 NOTE — Interval H&P Note (Signed)
History and Physical Interval Note:  03/15/2021 10:03 AM  Felicia Roberts  has presented today for surgery, with the diagnosis of R51.9 Temporal headache.  The various methods of treatment have been discussed with the patient and family. After consideration of risks, benefits and other options for treatment, the patient has consented to  Procedure(s): BIOPSY TEMPORAL ARTERY (Left) as a surgical intervention.  The patient's history has been reviewed, patient examined, no change in status, stable for surgery.  I have reviewed the patient's chart and labs.  Questions were answered to the patient's satisfaction.     Herbert Pun

## 2021-03-15 NOTE — Op Note (Signed)
Operative Report  Pre operative diagnosis: Left temporal headache  Post operative diagnosis: Left temporal headache  Procedure: Left temporal artery biopsy  Surgeon: Dr. Windell Moment  Indications: This 80 year old female has left temporal headache and diplopia and elevated erythrocyte sedimentation rate with left temporal artery tenderness to palpation. The patient has been informed that a temporal artery biopsy is required to confirm the diagnosis of temporal arteritis before starting steroid therapy. The risks of the procedure were explained to the patient who elected to proceed with the biopsy.   Description of procedure: The patient was placed in the supine position with the head turned so the operative side is up. A time-out was completed verifying correct patient, procedure, site, positioning, and implant(s) and/or special equipment prior to beginning this procedure. Deep sedation was administered. The left temporal area was prepped and draped in the usual sterile fashion. Local anesthetic was injected. A 3.5 cm incision was made directly over the artery through the skin and subcutaneous tissue using a #15 blade scalpel. The temporal artery was identified in the superficial layers of the superficial temporal fascia. Blunt dissection with a hemostat was performed parallel to the vessel to avoid tearing it. The dissection proceeded beneath the vessel so that a hemostat was passed below it. After the vessel was isolated, 3-0 silk sutures were passed around the proximal and distal portions of the isolated artery and tied. Branches of the main artery were also ligated. The vessel was then transected. After ensuring hemostasis, the subcutaneous tissues were closed using interrupted 4-0 Vicryl sutures. The skin was closed with a running subcuticular 4-0 Monocryl sutures. Dermabond was applied over the wound and covered with sterile dressings. The patient tolerated well the procedure. The specimen was sent  to the pathologist.

## 2021-03-15 NOTE — Discharge Instructions (Addendum)
°  Diet: Resume home heart healthy regular diet.   Activity: Increase activity as tolerated. Light activity and walking are encouraged. Do not drive or drink alcohol if taking narcotic pain medications.  Wound care: May shower with soapy water and pat dry (do not rub incisions), but no baths or submerging incision underwater until follow-up. (no swimming)   Medications: Resume all home medications. For mild to moderate pain: acetaminophen (Tylenol) or ibuprofen (if no kidney disease). Combining Tylenol with alcohol can substantially increase your risk of causing liver disease. Narcotic pain medications, if prescribed, can be used for severe pain, though may cause nausea, constipation, and drowsiness. Do not combine Tylenol and Norco within a 6 hour period as Norco contains Tylenol. If you do not need the narcotic pain medication, you do not need to fill the prescription.  Call office [phone]) at any time if any questions, worsening pain, fevers/chills, bleeding, drainage from incision site, or other concerns. AMBULATORY SURGERY  DISCHARGE INSTRUCTIONS   The drugs that you were given will stay in your system until tomorrow so for the next 24 hours you should not:  Drive an automobile Make any legal decisions Drink any alcoholic beverage   You may resume regular meals tomorrow.  Today it is better to start with liquids and gradually work up to solid foods.  You may eat anything you prefer, but it is better to start with liquids, then soup and crackers, and gradually work up to solid foods.   Please notify your doctor immediately if you have any unusual bleeding, trouble breathing, redness and pain at the surgery site, drainage, fever, or pain not relieved by medication.      Please contact your physician with any problems or Same Day Surgery at [phone], Monday through Friday 6 am to 4 pm, or [place] at [facility] number at [phone].

## 2021-03-16 LAB — SURGICAL PATHOLOGY

## 2021-03-16 NOTE — Anesthesia Postprocedure Evaluation (Signed)
Anesthesia Post Note  Patient: Felicia Roberts  Procedure(s) Performed: BIOPSY TEMPORAL ARTERY (Left: Head)  Patient location during evaluation: PACU Anesthesia Type: General Level of consciousness: awake and oriented Pain management: satisfactory to patient Vital Signs Assessment: post-procedure vital signs reviewed and stable Respiratory status: spontaneous breathing and nonlabored ventilation Cardiovascular status: stable Anesthetic complications: no   No notable events documented.   Last Vitals:  Vitals:   03/15/21 1215 03/15/21 1235  BP: (!) 119/53 100/72  Pulse: 67 69  Resp: 14 18  Temp: (!) 36.1 C 36.4 C  SpO2: 96% 99%    Last Pain:  Vitals:   03/15/21 1235  TempSrc: Tympanic  PainSc: 0-No pain                 VAN STAVEREN,Irmgard Rampersaud

## 2021-08-14 ENCOUNTER — Other Ambulatory Visit: Payer: Self-pay | Admitting: Ophthalmology

## 2021-08-14 ENCOUNTER — Ambulatory Visit
Admission: RE | Admit: 2021-08-14 | Discharge: 2021-08-14 | Disposition: A | Discharge status: Home / Self Care | Payer: Medicare HMO | Source: Ambulatory Visit | Attending: Ophthalmology | Admitting: Ophthalmology

## 2021-08-14 DIAGNOSIS — H5712 Ocular pain, left eye: Secondary | ICD-10-CM | POA: Insufficient documentation

## 2021-08-14 DIAGNOSIS — G44201 Tension-type headache, unspecified, intractable: Secondary | ICD-10-CM | POA: Diagnosis present

## 2021-08-14 MED ORDER — IOHEXOL 300 MG/ML  SOLN
100.0000 mL | Freq: Once | INTRAMUSCULAR | Status: AC | PRN
  Administered 2021-08-14: 100 mL via INTRAVENOUS

## 2022-04-09 ENCOUNTER — Other Ambulatory Visit: Payer: Self-pay | Admitting: Gastroenterology

## 2022-04-09 DIAGNOSIS — K219 Gastro-esophageal reflux disease without esophagitis: Secondary | ICD-10-CM

## 2022-04-09 DIAGNOSIS — R1310 Dysphagia, unspecified: Secondary | ICD-10-CM

## 2022-04-16 ENCOUNTER — Other Ambulatory Visit: Payer: Self-pay | Admitting: Gastroenterology

## 2022-04-16 ENCOUNTER — Ambulatory Visit
Admission: RE | Admit: 2022-04-16 | Discharge: 2022-04-16 | Disposition: A | Discharge status: Home / Self Care | Payer: Medicare HMO | Source: Ambulatory Visit | Attending: Gastroenterology | Admitting: Gastroenterology

## 2022-04-16 DIAGNOSIS — R1310 Dysphagia, unspecified: Secondary | ICD-10-CM | POA: Insufficient documentation

## 2022-04-16 DIAGNOSIS — K219 Gastro-esophageal reflux disease without esophagitis: Secondary | ICD-10-CM | POA: Insufficient documentation

## 2022-04-20 ENCOUNTER — Other Ambulatory Visit: Payer: Self-pay

## 2022-04-20 ENCOUNTER — Emergency Department: Payer: Medicare HMO

## 2022-04-20 ENCOUNTER — Emergency Department
Admission: EM | Admit: 2022-04-20 | Discharge: 2022-04-20 | Disposition: A | Discharge status: Home / Self Care | Payer: Medicare HMO | Attending: Emergency Medicine | Admitting: Emergency Medicine

## 2022-04-20 DIAGNOSIS — M436 Torticollis: Secondary | ICD-10-CM | POA: Insufficient documentation

## 2022-04-20 DIAGNOSIS — I10 Essential (primary) hypertension: Secondary | ICD-10-CM | POA: Insufficient documentation

## 2022-04-20 DIAGNOSIS — M542 Cervicalgia: Secondary | ICD-10-CM | POA: Diagnosis present

## 2022-04-20 LAB — CBC
HCT: 38.6 % (ref 36.0–46.0)
Hemoglobin: 11.9 g/dL — ABNORMAL LOW (ref 12.0–15.0)
MCH: 29.8 pg (ref 26.0–34.0)
MCHC: 30.8 g/dL (ref 30.0–36.0)
MCV: 96.7 fL (ref 80.0–100.0)
Platelets: 269 10*3/uL (ref 150–400)
RBC: 3.99 MIL/uL (ref 3.87–5.11)
RDW: 14.3 % (ref 11.5–15.5)
WBC: 7.9 10*3/uL (ref 4.0–10.5)
nRBC: 0 % (ref 0.0–0.2)

## 2022-04-20 LAB — BASIC METABOLIC PANEL
Anion gap: 13 (ref 5–15)
BUN: 24 mg/dL — ABNORMAL HIGH (ref 8–23)
CO2: 21 mmol/L — ABNORMAL LOW (ref 22–32)
Calcium: 9.5 mg/dL (ref 8.9–10.3)
Chloride: 103 mmol/L (ref 98–111)
Creatinine, Ser: 1 mg/dL (ref 0.44–1.00)
GFR, Estimated: 57 mL/min — ABNORMAL LOW (ref >60)
Glucose, Bld: 120 mg/dL — ABNORMAL HIGH (ref 70–99)
Potassium: 3.2 mmol/L — ABNORMAL LOW (ref 3.5–5.1)
Sodium: 137 mmol/L (ref 135–145)

## 2022-04-20 LAB — TROPONIN I (HIGH SENSITIVITY): Troponin I (High Sensitivity): 12 ng/L (ref <18)

## 2022-04-20 MED ORDER — NAPROXEN 500 MG PO TABS: 500.0000 mg | ORAL_TABLET | Freq: Two times a day (BID) | ORAL | 2 refills | Status: DC

## 2022-04-20 MED ORDER — CYCLOBENZAPRINE HCL 5 MG PO TABS: 5.0000 mg | ORAL_TABLET | Freq: Three times a day (TID) | ORAL | 0 refills | Status: DC | PRN

## 2022-04-20 NOTE — ED Provider Notes (Addendum)
[facility] Provider Note    Event Date/Time   First MD Initiated Contact with Patient 04/20/22 1148     (approximate)   History   Chest Pain   HPI  Felicia Roberts is a 82 y.o. female who presents with complaints of right neck pain.  Patient reports that she has had neck pain for about 2 to 3 days.  She woke up with it and has been severe.  Is worse when she tries to turn her head.  She reports the pain has been significant, went to urgent care today and they referred her to the emergency department because her blood pressure was elevated.  She denies chest pain to me.  No shortness of breath     Physical Exam   Triage Vital Signs: ED Triage Vitals [04/20/22 1022]  Enc Vitals Group     BP (!) 190/86     Pulse Rate 95     Resp 18     Temp 98.4 F (36.9 C)     Temp Source Oral     SpO2 96 %     Weight      Height      Head Circumference      Peak Flow      Pain Score 10     Pain Loc      Pain Edu?      Excl. in [place]?     Most recent vital signs: Vitals:   04/20/22 1022 04/20/22 1234  BP: (!) 190/86 (!) 180/80  Pulse: 95 80  Resp: 18 18  Temp: 98.4 F (36.9 C)   SpO2: 96% 97%     General: Awake, no distress.  CV:  Good peripheral perfusion.  Resp:  Normal effort.  Abd:  No distention.  Other:  Tenderness to palpation along the posterior lateral right neck   ED Results / Procedures / Treatments   Labs (all labs ordered are listed, but only abnormal results are displayed) Labs Reviewed  BASIC METABOLIC PANEL - Abnormal; Notable for the following components:      Result Value   Potassium 3.2 (*)    CO2 21 (*)    Glucose, Bld 120 (*)    BUN 24 (*)    GFR, Estimated 57 (*)    All other components within normal limits  CBC - Abnormal; Notable for the following components:   Hemoglobin 11.9 (*)    All other components within normal limits  TROPONIN I (HIGH SENSITIVITY)     EKG  ED ECG REPORT I, Lavonia Drafts, the  attending physician, personally viewed and interpreted this ECG.  Date: 04/20/2022  Rhythm: normal sinus rhythm QRS Axis: normal Intervals: normal ST/T Wave abnormalities: normal Narrative Interpretation: no evidence of acute ischemia    RADIOLOGY Chest x-ray viewed interpret by me, no acute abnormality    PROCEDURES:  Critical Care performed:   Procedures   MEDICATIONS ORDERED IN ED: Medications - No data to display   IMPRESSION / MDM / [place] / ED COURSE  I reviewed the triage vital signs and the nursing notes. Patient's presentation is most consistent with acute presentation with potential threat to life or bodily function.  Patient presents with symptoms as above.  Overall she is primarily concerned about her neck pain which is consistent with acute torticollis/muscle spasm.  Lab work reviewed and is reassuring, high sensitive troponin, EKG unremarkable, chest x-ray without acute abnormality.  Will treat the patient's neck pain, I  anticipate that her high blood pressure is related to pain.  Appropriate for discharge at this time, no indication for admission, I have asked the patient to follow-up with her PCP within 1 week for adjustment of her blood pressure medications if needed        FINAL CLINICAL IMPRESSION(S) / ED DIAGNOSES   Final diagnoses:  Torticollis, acute  Uncontrolled hypertension     Rx / DC Orders   ED Discharge Orders          Ordered    naproxen (NAPROSYN) 500 MG tablet  2 times daily with meals        04/20/22 1217    cyclobenzaprine (FLEXERIL) 5 MG tablet  3 times daily PRN        04/20/22 1217             Note:  This document was prepared using Dragon voice recognition software and may include unintentional dictation errors.   Lavonia Drafts, MD 04/20/22 1911    Lavonia Drafts, MD 04/20/22 1911

## 2022-04-20 NOTE — ED Triage Notes (Signed)
Pt to ED via POV from [facility]. Pt sent over due to CP and HTN. Pt reports left sided CP, elevated BP, neck stiffness and loss of appetite x3-4 days. Pt reports did not take BP medications this morning.

## 2022-04-26 ENCOUNTER — Encounter: Payer: Self-pay | Admitting: Emergency Medicine

## 2022-04-26 ENCOUNTER — Emergency Department
Admission: EM | Admit: 2022-04-26 | Discharge: 2022-04-26 | Disposition: A | Discharge status: Home / Self Care | Payer: Medicare HMO | Attending: Emergency Medicine | Admitting: Emergency Medicine

## 2022-04-26 ENCOUNTER — Emergency Department: Payer: Medicare HMO

## 2022-04-26 ENCOUNTER — Other Ambulatory Visit: Payer: Self-pay

## 2022-04-26 DIAGNOSIS — E1122 Type 2 diabetes mellitus with diabetic chronic kidney disease: Secondary | ICD-10-CM | POA: Diagnosis not present

## 2022-04-26 DIAGNOSIS — M5441 Lumbago with sciatica, right side: Secondary | ICD-10-CM | POA: Diagnosis not present

## 2022-04-26 DIAGNOSIS — N189 Chronic kidney disease, unspecified: Secondary | ICD-10-CM | POA: Diagnosis not present

## 2022-04-26 DIAGNOSIS — I129 Hypertensive chronic kidney disease with stage 1 through stage 4 chronic kidney disease, or unspecified chronic kidney disease: Secondary | ICD-10-CM | POA: Insufficient documentation

## 2022-04-26 DIAGNOSIS — M7130 Other bursal cyst, unspecified site: Secondary | ICD-10-CM | POA: Insufficient documentation

## 2022-04-26 DIAGNOSIS — M5431 Sciatica, right side: Secondary | ICD-10-CM

## 2022-04-26 DIAGNOSIS — M545 Low back pain, unspecified: Secondary | ICD-10-CM | POA: Diagnosis present

## 2022-04-26 LAB — URINALYSIS, ROUTINE W REFLEX MICROSCOPIC
Bilirubin Urine: NEGATIVE
Glucose, UA: 50 mg/dL — AB
Hgb urine dipstick: NEGATIVE
Ketones, ur: 5 mg/dL — AB
Leukocytes,Ua: NEGATIVE
Nitrite: NEGATIVE
Protein, ur: >=300 mg/dL — AB
Specific Gravity, Urine: 1.007 (ref 1.005–1.030)
pH: 7 (ref 5.0–8.0)

## 2022-04-26 LAB — CBC
HCT: 38.2 % (ref 36.0–46.0)
Hemoglobin: 12 g/dL (ref 12.0–15.0)
MCH: 29.1 pg (ref 26.0–34.0)
MCHC: 31.4 g/dL (ref 30.0–36.0)
MCV: 92.7 fL (ref 80.0–100.0)
Platelets: 425 10*3/uL — ABNORMAL HIGH (ref 150–400)
RBC: 4.12 MIL/uL (ref 3.87–5.11)
RDW: 13.8 % (ref 11.5–15.5)
WBC: 6.6 10*3/uL (ref 4.0–10.5)
nRBC: 0 % (ref 0.0–0.2)

## 2022-04-26 LAB — BASIC METABOLIC PANEL
Anion gap: 13 (ref 5–15)
BUN: 31 mg/dL — ABNORMAL HIGH (ref 8–23)
CO2: 25 mmol/L (ref 22–32)
Calcium: 10.1 mg/dL (ref 8.9–10.3)
Chloride: 100 mmol/L (ref 98–111)
Creatinine, Ser: 1.1 mg/dL — ABNORMAL HIGH (ref 0.44–1.00)
GFR, Estimated: 50 mL/min — ABNORMAL LOW (ref >60)
Glucose, Bld: 194 mg/dL — ABNORMAL HIGH (ref 70–99)
Potassium: 3.5 mmol/L (ref 3.5–5.1)
Sodium: 138 mmol/L (ref 135–145)

## 2022-04-26 MED ORDER — LIDOCAINE 5 % EX PTCH: 1.0000 | MEDICATED_PATCH | Freq: Two times a day (BID) | CUTANEOUS | 0 refills | Status: AC

## 2022-04-26 MED ORDER — OXYCODONE HCL 5 MG PO TABS
2.5000 mg | ORAL_TABLET | Freq: Once | ORAL | Status: AC
  Administered 2022-04-26: 2.5 mg via ORAL
  Filled 2022-04-26: qty 1

## 2022-04-26 MED ORDER — ACETAMINOPHEN 500 MG PO TABS
1000.0000 mg | ORAL_TABLET | Freq: Once | ORAL | Status: AC
  Administered 2022-04-26: 1000 mg via ORAL
  Filled 2022-04-26: qty 2

## 2022-04-26 MED ORDER — OXYCODONE HCL 5 MG PO TABS: 2.5000 mg | ORAL_TABLET | Freq: Four times a day (QID) | ORAL | 0 refills | Status: AC | PRN

## 2022-04-26 MED ORDER — LIDOCAINE 5 % EX PTCH
1.0000 | MEDICATED_PATCH | CUTANEOUS | Status: DC
  Administered 2022-04-26: 1 via TRANSDERMAL
  Filled 2022-04-26: qty 1

## 2022-04-26 NOTE — Discharge Instructions (Addendum)
Take Tylenol 1 g every 8 hours, the steroids, lidocaine patches, call the neurosurgery number to get follow-up.  Return to the ER if unable to ambulate, new weakness in the leg, uncontrollable urination or defecation or any other concerns.  Do not take the oxycodone with the tramadol.  The brace is for comfort.  You can take it off if it is not helping or if you need to shower or at nighttime.  Take oxycodone as prescribed. Do not drink alcohol, drive or participate in any other potentially dangerous activities while taking this medication as it may make you sleepy. Do not take this medication with any other sedating medications, either prescription or over-the-counter. If you were prescribed Percocet or Vicodin, do not take these with acetaminophen (Tylenol) as it is already contained within these medications.  This medication is an opiate (or narcotic) pain medication and can be habit forming. Use it as little as possible to achieve adequate pain control. Do not use or use it with extreme caution if you have a history of opiate abuse or dependence. If you are on a pain contract with your primary care doctor or a pain specialist, be sure to let them know you were prescribed this medication today from the Emergency Department. This medication is intended for your use only - do not give any to anyone else and keep it in a secure place where nobody else, especially children, have access to it.    IMPRESSION: 1. Moderate right facet arthropathy at L4-L5 with a 6 mm synovial cyst in the right subarticular zone which likely impinges the traversing L5 nerve root. 2. Otherwise, overall mild degenerative changes throughout the remainder of the lumbar spine as above without other significant spinal canal or neural foraminal stenosis.

## 2022-04-26 NOTE — ED Triage Notes (Signed)
Pt here with right hip pain. Pt reached out to [facility] to request an MRI but was unable to get one scheduled and came to the ED. Pt is also having trouble standing.

## 2022-04-26 NOTE — ED Notes (Signed)
Called for brace waiting for them to deliver to patient

## 2022-04-26 NOTE — Progress Notes (Signed)
Orthopedic Tech Progress Note Patient Details:  [name] 03/08/1941 [id number]  Scott from [facility] made aware of the LSO quick draw order at 1151 and will deliver it today.  Patient ID: Felicia Roberts, female   DOB: 1940-11-09, 82 y.o.   MRN: [id number]  Carin Primrose 04/26/2022, 1:34 PM

## 2022-04-26 NOTE — ED Notes (Signed)
Pt verbalizes understanding of discharge instructions. Opportunity for questioning and answers were provided. Pt discharged from ED to home with daughter.    

## 2022-04-26 NOTE — ED Provider Notes (Signed)
[facility] Provider Note    Event Date/Time   First MD Initiated Contact with Patient 04/26/22 1102     (approximate)   History   Hip Pain   HPI  Felicia Roberts is a 82 y.o. female with diabetes, CKD, hypertension, GERD who comes in with concerns for back pain.  Patient reports over the past week having worsening back pain.  She reports the pain is worse with movement.  She denies any pain at rest when she tries to sit up and walk she reports the pain.  The pain is in her low back midline area and then shoots down her right leg.  She denies any numbness or tingling or any bowel or bladder incontinence.  She reports taking tramadol at home without significant relief.  She presents today because her doctor ordered an MRI but has not been cleared for outpatient and she states that she would like to have an MRI done today to get a further look at what is going on.  She is not to the point where it is very hard for her to ambulate at home secondary to the pain.  She denies any falls, chest pain, shortness of breath.   On review of records patient was seen on 1/31 diagnosed with a closed compression fracture of L4 patient was started on steroid taper.   Physical Exam   Triage Vital Signs: ED Triage Vitals  Enc Vitals Group     BP 04/26/22 1056 (!) 205/74     Pulse Rate 04/26/22 1055 73     Resp 04/26/22 1055 18     Temp 04/26/22 1055 98.4 F (36.9 C)     Temp Source 04/26/22 1055 Oral     SpO2 04/26/22 1055 96 %     Weight 04/26/22 1055 117 lb 15.1 oz (53.5 kg)     Height 04/26/22 1055 5' (1.524 m)     Head Circumference --      Peak Flow --      Pain Score 04/26/22 1055 10     Pain Loc --      Pain Edu? --      Excl. in [place]? --     Most recent vital signs: Vitals:   04/26/22 1055 04/26/22 1056  BP:  (!) 205/74  Pulse: 73   Resp: 18   Temp: 98.4 F (36.9 C)   SpO2: 96%      General: Awake, no distress.  CV:  Good peripheral perfusion.   Resp:  Normal effort.  Abd:  No distention.  Other:  Good strength lifting up both legs.  Sensation intact.  She has got some pain on the right SI joint.  Some low L-spine tenderness pain.  She has no redness or warmth of the joint.  Full range of motion of the joint.  Good distal pulse.  Slight swelling in the right leg compared to the left but very trace in amount.   ED Results / Procedures / Treatments   Labs (all labs ordered are listed, but only abnormal results are displayed) Labs Reviewed  CBC - Abnormal; Notable for the following components:      Result Value   Platelets 425 (*)    All other components within normal limits  BASIC METABOLIC PANEL - Abnormal; Notable for the following components:   Glucose, Bld 194 (*)    BUN 31 (*)    Creatinine, Ser 1.10 (*)    GFR, Estimated 50 (*)  All other components within normal limits  URINALYSIS, ROUTINE W REFLEX MICROSCOPIC     RADIOLOGY I have reviewed the ultrasound personally interpreted and patient has no evidence of DVT   PROCEDURES:  Critical Care performed: No  Procedures   MEDICATIONS ORDERED IN ED: Medications  lidocaine (LIDODERM) 5 % 1 patch (1 patch Transdermal Patch Applied 04/26/22 1137)  acetaminophen (TYLENOL) tablet 1,000 mg (1,000 mg Oral Given 04/26/22 1137)  oxyCODONE (Oxy IR/ROXICODONE) immediate release tablet 2.5 mg (2.5 mg Oral Given 04/26/22 1137)     IMPRESSION / MDM / ASSESSMENT AND PLAN / ED COURSE  I reviewed the triage vital signs and the nursing notes.   Patient's presentation is most consistent with acute presentation with potential threat to life or bodily function.   Patient comes in with what sounds like sciatica versus pain from a compression fracture.  She denies any falls or hitting her leg.  She has got good strength raise on her right leg.  Doubt hip fracture.  No red flag symptoms but given her progressively difficulty with ambulation she will need close follow-up with  neurosurgery so we will get MRI to help facilitate this for patient.  Patient treated with Tylenol, oxycodone, lidocaine patch.  Will get LSO brace.  She does have some trace swelling noted in her right leg.  Will get ultrasound to evaluate for a DVT.  Ultrasound negative labs are around baseline.  Blood pressure elevated here but she had not taken her blood pressure medication.  She will take these when she gets home.  IMPRESSION: 1. Moderate right facet arthropathy at L4-L5 with a 6 mm synovial cyst in the right subarticular zone which likely impinges the traversing L5 nerve root. 2. Otherwise, overall mild degenerative changes throughout the remainder of the lumbar spine as above without other significant spinal canal or neural foraminal stenosis.   Suspect that the above could be causing her pain on the L5 nerve root.  Discussed with Dr.Yarbrough and these are sometimes drained therefore will have her follow-up outpatient with him.  Patient feels better and is able to ambulate.  Will use a walker just to help steady her.  They feel comfortable with outpatient management at this time given still able to ambulate.  We discussed pain management with Tylenol, lidocaine patches.  She was prescribed steroids already but has not started taking them.  She reports some improvement with the oxycodone or the tramadol.  Encouraged her not to take them together but will prescribe a short course of oxycodone to help with pain.  We discussed the risk for falls and weakness and they understand these risks and would like to proceed with the oxycodone.       FINAL CLINICAL IMPRESSION(S) / ED DIAGNOSES   Final diagnoses:  Sciatica of right side  Synovial cyst     Rx / DC Orders   ED Discharge Orders          Ordered    lidocaine (LIDODERM) 5 %  Every 12 hours        04/26/22 1444    oxyCODONE (ROXICODONE) 5 MG immediate release tablet  Every 6 hours PRN        04/26/22 1444              Note:  This document was prepared using Dragon voice recognition software and may include unintentional dictation errors.   Vanessa [place], MD 04/26/22 [phone]

## 2022-04-26 NOTE — ED Notes (Signed)
Pt has not taken her bp medication today.

## 2022-05-01 ENCOUNTER — Other Ambulatory Visit: Payer: Self-pay

## 2022-05-01 ENCOUNTER — Ambulatory Visit
Admission: RE | Admit: 2022-05-01 | Discharge: 2022-05-01 | Disposition: A | Discharge status: Home / Self Care | Payer: Self-pay | Source: Ambulatory Visit | Attending: Orthopedic Surgery | Admitting: Orthopedic Surgery

## 2022-05-01 DIAGNOSIS — Z049 Encounter for examination and observation for unspecified reason: Secondary | ICD-10-CM

## 2022-05-02 NOTE — Progress Notes (Unsigned)
Referring Physician:  Vanessa [place], MD [place],  [place] [id number]  Primary Physician:  Tracie Harrier, MD  History of Present Illness: 05/03/2022 Ms. Zoie Sarin has a history of HTN, asthma, DM, CKD 3, hyperlipidemia.   Seen by PCP on 04/24/22 for back pain and xrays showed possible L4 compression fracture. She was given prednisone taper and MRI ordered.   Seen in ED on 04/26/22 and had MRI done. She was given LSO brace.   She has intermittent right sided LBP with radiation to her right buttocks. Pain has improved since last week. No numbness, tingling, or weakness in her legs. No pain with sitting. Pain is worse with walking- she only has discomfort now. She has some improvement with LSO brace.   She feels like steroids helped.   Given lidoderm patches and oxycodone 2.'5mg'$  from ED. Not using pain medication.   Bowel/Bladder Dysfunction: none  Conservative measures:  Physical therapy: none  Multimodal medical therapy including regular antiinflammatories: prednisone taper  Injections: No epidural steroid injections  Past Surgery: no spinal surgery.   Loura Pitt has no symptoms of cervical myelopathy.  The symptoms are causing a significant impact on the patient's life.   Review of Systems:  A 10 point review of systems is negative, except for the pertinent positives and negatives detailed in the HPI.  Past Medical History: Past Medical History:  Diagnosis Date   Asthma    Chicken pox    Chronic kidney disease    Colon polyps    Diabetes mellitus without complication ([place])    Diverticulosis    GERD (gastroesophageal reflux disease)    Hyperlipidemia    Hypertension     Past Surgical History: Past Surgical History:  Procedure Laterality Date   ARTERY BIOPSY Left 03/15/2021   Procedure: BIOPSY TEMPORAL ARTERY;  Surgeon: Herbert Pun, MD;  Location: [facility] ORS;  Service: General;  Laterality: Left;   BLADDER SURGERY      COLONOSCOPY WITH PROPOFOL N/A 09/25/2017   Procedure: COLONOSCOPY WITH PROPOFOL;  Surgeon: Toledo, Benay Pike, MD;  Location: [facility] ENDOSCOPY;  Service: Gastroenterology;  Laterality: N/A;   ESOPHAGOGASTRODUODENOSCOPY N/A 12/30/2020   Procedure: ESOPHAGOGASTRODUODENOSCOPY (EGD);  Surgeon: Annamaria Helling, DO;  Location[facility] ENDOSCOPY;  Service: Gastroenterology;  Laterality: N/A;   ESOPHAGOGASTRODUODENOSCOPY (EGD) WITH PROPOFOL N/A 09/25/2017   Procedure: ESOPHAGOGASTRODUODENOSCOPY (EGD) WITH PROPOFOL;  Surgeon: Toledo, Benay Pike, MD;  Location: [facility] ENDOSCOPY;  Service: Gastroenterology;  Laterality: N/A;   EYE SURGERY     URETHRAL DIVERTICULUM REPAIR  1980    Allergies: Allergies as of 05/03/2022 - Review Complete 05/03/2022  Allergen Reaction Noted   Farxiga [dapagliflozin] Anaphylaxis 11/10/2020   Toviaz [fesoterodine fumarate er] Other (See Comments) 04/25/2018   Januvia [sitagliptin] Rash 05/09/2015    Medications: Outpatient Encounter Medications as of 05/03/2022  Medication Sig   ADVAIR HFA 115-21 MCG/ACT inhaler Inhale 2 puffs into the lungs every 12 (twelve) hours.   albuterol (PROVENTIL HFA;VENTOLIN HFA) 108 (90 Base) MCG/ACT inhaler INHALE ONE TO TWO PUFFS BY MOUTH EVERY 6 HOURS AS NEEDED FOR WHEEZING OR  SHORTNESS  OF  BREATH   albuterol (PROVENTIL) (2.5 MG/3ML) 0.083% nebulizer solution Take 3 mLs (2.5 mg total) by nebulization every 6 (six) hours as needed for wheezing or shortness of breath.   amLODipine (NORVASC) 5 MG tablet TAKE 1 TABLET BY MOUTH ONCE DAILY   amoxicillin-clavulanate (AUGMENTIN) 875-125 MG tablet Take 1 tablet by mouth 2 (two) times daily.   aspirin EC 81  MG tablet Take by mouth.   atorvastatin (LIPITOR) 40 MG tablet Take 1 tablet (40 mg total) by mouth daily.   Azelastine HCl 137 MCG/SPRAY SOLN SMARTSIG:1-2 Spray(s) Both Nares Every 12 Hours PRN   blood glucose meter kit and supplies See admin instructions.   cefUROXime (CEFTIN) 250 MG tablet Take  250 mg by mouth 2 (two) times daily with a meal.   ciprofloxacin (CIPRO) 250 MG tablet Take 250 mg by mouth 2 (two) times daily.   clobetasol (OLUX) 0.05 % topical foam Apply topically daily.   cyclobenzaprine (FLEXERIL) 5 MG tablet Take 1 tablet (5 mg total) by mouth 3 (three) times daily as needed for muscle spasms.   Docusate Sodium (DSS) 100 MG CAPS Take 1 capsule by mouth 2 (two) times daily.   EUTHYROX 50 MCG tablet Take 50 mcg by mouth every morning.   fluconazole (DIFLUCAN) 150 MG tablet Take by mouth.   fluticasone (FLONASE) 50 MCG/ACT nasal spray Place 2 sprays into both nostrils daily.   FREESTYLE LITE test strip USE 1 STRIP TO CHECK GLUCOSE TWICE DAILY   gabapentin (NEURONTIN) 100 MG capsule Take 100 mg by mouth at bedtime.   glipiZIDE (GLUCOTROL XL) 5 MG 24 hr tablet Take 5 mg by mouth daily.   levothyroxine (SYNTHROID) 25 MCG tablet Take 25 mcg by mouth daily before breakfast.   lisinopril (PRINIVIL,ZESTRIL) 20 MG tablet TAKE 1 TABLET BY MOUTH ONCE DAILY   metFORMIN (GLUCOPHAGE-XR) 500 MG 24 hr tablet Take 1,000 mg by mouth daily after supper.    MIEBO 1.338 GM/ML SOLN Apply 1 drop to eye 4 (four) times daily.   mometasone (ELOCON) 0.1 % lotion SMARTSIG:2-4 Drop(s) In Ear(s) Daily PRN   naproxen (NAPROSYN) 500 MG tablet Take 1 tablet (500 mg total) by mouth 2 (two) times daily with a meal.   omeprazole (PRILOSEC) 20 MG capsule Take 20 mg by mouth daily as needed.   pantoprazole (PROTONIX) 40 MG tablet Take 40 mg by mouth daily.   pioglitazone (ACTOS) 30 MG tablet Take 30 mg by mouth daily.   predniSONE (DELTASONE) 10 MG tablet Take by mouth.   No facility-administered encounter medications on file as of 05/03/2022.    Social History: Social History   Tobacco Use   Smoking status: Never   Smokeless tobacco: Never  Vaping Use   Vaping Use: Never used  Substance Use Topics   Alcohol use: No   Drug use: Never    Family Medical History: Family History  Problem Relation  Age of Onset   Cancer Father        prostate   Diabetes Sister    Hypertension Sister    AAA (abdominal aortic aneurysm) Brother    Diabetes Sister    Hypertension Sister    Diabetes Sister    Hypertension Sister    Diabetes Sister    Hypertension Sister    Hypertension Mother    Heart disease Mother    Diabetes Mother    Breast cancer Neg Hx     Physical Examination: Vitals:   05/03/22 1048  BP: (!) 140/70    General: Patient is well developed, well nourished, calm, collected, and in no apparent distress. Attention to examination is appropriate.  Respiratory: Patient is breathing without any difficulty.   NEUROLOGICAL:     Awake, alert, oriented to person, place, and time.  Speech is clear and fluent. Fund of knowledge is appropriate.   Cranial Nerves: Pupils equal round and reactive to light.  Facial tone is symmetric.    no posterior lumbar tenderness. She has mild right SI joint tenderness.   No abnormal lesions on exposed skin.   Strength: Side Biceps Triceps Deltoid Interossei Grip Wrist Ext. Wrist Flex.  R '5 5 5 5 5 5 5  '$ L '5 5 5 5 5 5 5   '$ Side Iliopsoas Quads Hamstring PF DF EHL  R '5 5 5 5 5 5  '$ L '5 5 5 5 5 5   '$ Reflexes are 2+ and symmetric at the biceps, triceps, brachioradialis, patella and achilles.   Hoffman's is absent.  Clonus is not present.   Bilateral upper and lower extremity sensation is intact to light touch.     Gait is normal.     Medical Decision Making  Imaging: Lumbar xrays dated 04/24/22:  Lumbar spondylosis  No report available for above xrays.   Lumbar MRI dated 04/26/22:  FINDINGS: Segmentation: Standard; the lowest formed disc space is designated L5-S1.   Alignment: There is mild levocurvature centered at the thoracolumbar junction. There is no significant antero or retrolisthesis.   Vertebrae: Background marrow signal is normal. There is no suspicious marrow signal abnormality or marrow edema. Vertebral body heights  are preserved, without evidence of acute injury.   Conus medullaris and cauda equina: Conus extends to the L1 level. Conus and cauda equina appear normal.   Paraspinal and other soft tissues: Unremarkable.   Disc levels:   There is disc desiccation at L2-L3 through L4-L5 with mild loss of height at L2-L3. The other disc heights are overall preserved.   T12-L1: No significant spinal canal or neural foraminal stenosis   L1-L2: No significant spinal canal or neural foraminal stenosis.   L2-L3: There is a disc bulge eccentric to the left and mild left worse than right facet arthropathy without significant spinal canal or neural foraminal stenosis   L3-L4: There is a disc bulge and mild bilateral facet arthropathy without significant spinal canal or neural foraminal stenosis.   L4-L5: There is a disc bulge and moderate right and mild left facet arthropathy. There is a 6 mm synovial cyst in the right subarticular zone, as well as a small posteriorly projecting synovial cysts and a small facet joint effusion. There is no cortical destruction to suggest septic arthritis. Findings result in mild-to-moderate spinal canal stenosis with right subarticular zone effacement and likely impingement of the traversing L5 nerve root by the synovial cyst, and mild bilateral neural foraminal stenosis   L5-S1: No significant spinal canal or neural foraminal stenosis.   IMPRESSION: 1. Moderate right facet arthropathy at L4-L5 with a 6 mm synovial cyst in the right subarticular zone which likely impinges the traversing L5 nerve root. 2. Otherwise, overall mild degenerative changes throughout the remainder of the lumbar spine as above without other significant spinal canal or neural foraminal stenosis.     Electronically Signed   By: Valetta Mole M.D.   On: 04/26/2022 13:15  I have personally reviewed the images and agree with the above interpretation.  Assessment and Plan: Ms. Cercone is a  pleasant 82 y.o. female that has intermittent right sided LBP with radiation to her right buttocks. Pain has improved a lot since last week. She only has mild discomfort.   She has known diffuse lumbar spondylosis along with right facet arthropathy at L4-L5 with a 6 mm synovial cyst in the right subarticular zone which likely impinges the traversing L5 nerve root. No slip seen at L4-L5 on xrays.  LBP is likely facet mediated and she may have some SI joint pain as well. Would expect cyst to cause radicular pain in right leg- she does not have this.   Treatment options discussed with patient and following plan made:   - Order for physical therapy for lumbar spine sent to [facility] in [place]. Patient to call to schedule appointment.  - Referral to PMR at [facility] to discuss possible lumbar injections. She is improving, but would still like to see them to discuss options.  - If she gets worse, consider lumbar flex/ext xrays. No slip seen on above lumbar xrays.  - Follow up with me in 6-8 weeks for recheck.   I spent a total of 40 minutes in face-to-face and non-face-to-face activities related to this patient's care today including review of outside records, review of imaging, review of symptoms, physical exam, discussion of differential diagnosis, discussion of treatment options, and documentation.   Thank you for involving me in the care of this patient.   Geronimo Boot PA-C Dept. of Neurosurgery

## 2022-05-03 ENCOUNTER — Ambulatory Visit (INDEPENDENT_AMBULATORY_CARE_PROVIDER_SITE_OTHER): Payer: Medicare HMO | Admitting: Orthopedic Surgery

## 2022-05-03 VITALS — BP 140/70 | Ht 60.0 in | Wt 127.4 lb

## 2022-05-03 DIAGNOSIS — M545 Low back pain, unspecified: Secondary | ICD-10-CM | POA: Diagnosis not present

## 2022-05-03 DIAGNOSIS — M47816 Spondylosis without myelopathy or radiculopathy, lumbar region: Secondary | ICD-10-CM | POA: Diagnosis not present

## 2022-05-03 NOTE — Patient Instructions (Signed)
It was so nice to see you today. Thank you so much for coming in.    You have some wear and tear in your back (arthritis) and I think this is causing your pain. You do not have a broken bone in your back.   I sent physical therapy orders to [facility]. You need to call them to schedule your visit. [phone]  Wear the back brace only if you are having severe pain.   I want you to see physical medicine and rehab at the [facility] to discuss possible injections in your lower back. Dr. Sharlet Salina, Dr. Alba Destine, and their PA Loree Fee are great and will take good care of you. They should call you to schedule an appointment or you can call them at [phone].   I will see you back in 6-8 weeks. Please do not hesitate to call if you have any questions or concerns. You can also message me in [place].   Geronimo Boot PA-C [phone]

## 2022-06-18 ENCOUNTER — Ambulatory Visit: Payer: Self-pay | Admitting: Orthopedic Surgery

## 2022-09-07 ENCOUNTER — Encounter: Payer: Self-pay | Admitting: Gastroenterology

## 2022-09-09 ENCOUNTER — Other Ambulatory Visit: Payer: Self-pay

## 2022-09-09 ENCOUNTER — Emergency Department: Payer: Medicare HMO

## 2022-09-09 ENCOUNTER — Observation Stay
Admission: EM | Admit: 2022-09-09 | Discharge: 2022-09-10 | Disposition: A | Discharge status: Home / Self Care | Payer: Medicare HMO | Attending: Internal Medicine | Admitting: Internal Medicine

## 2022-09-09 ENCOUNTER — Encounter: Payer: Self-pay | Admitting: Emergency Medicine

## 2022-09-09 DIAGNOSIS — E785 Hyperlipidemia, unspecified: Secondary | ICD-10-CM | POA: Diagnosis present

## 2022-09-09 DIAGNOSIS — I129 Hypertensive chronic kidney disease with stage 1 through stage 4 chronic kidney disease, or unspecified chronic kidney disease: Secondary | ICD-10-CM | POA: Insufficient documentation

## 2022-09-09 DIAGNOSIS — E039 Hypothyroidism, unspecified: Secondary | ICD-10-CM | POA: Diagnosis present

## 2022-09-09 DIAGNOSIS — I160 Hypertensive urgency: Secondary | ICD-10-CM | POA: Diagnosis not present

## 2022-09-09 DIAGNOSIS — Z7984 Long term (current) use of oral hypoglycemic drugs: Secondary | ICD-10-CM | POA: Diagnosis not present

## 2022-09-09 DIAGNOSIS — I10 Essential (primary) hypertension: Secondary | ICD-10-CM | POA: Diagnosis not present

## 2022-09-09 DIAGNOSIS — N1831 Chronic kidney disease, stage 3a: Secondary | ICD-10-CM

## 2022-09-09 DIAGNOSIS — R519 Headache, unspecified: Secondary | ICD-10-CM | POA: Diagnosis present

## 2022-09-09 DIAGNOSIS — J45909 Unspecified asthma, uncomplicated: Secondary | ICD-10-CM | POA: Diagnosis present

## 2022-09-09 DIAGNOSIS — Z79899 Other long term (current) drug therapy: Secondary | ICD-10-CM | POA: Diagnosis not present

## 2022-09-09 DIAGNOSIS — Z7982 Long term (current) use of aspirin: Secondary | ICD-10-CM | POA: Diagnosis not present

## 2022-09-09 DIAGNOSIS — E1129 Type 2 diabetes mellitus with other diabetic kidney complication: Secondary | ICD-10-CM | POA: Diagnosis present

## 2022-09-09 DIAGNOSIS — E1122 Type 2 diabetes mellitus with diabetic chronic kidney disease: Secondary | ICD-10-CM | POA: Insufficient documentation

## 2022-09-09 DIAGNOSIS — H539 Unspecified visual disturbance: Secondary | ICD-10-CM

## 2022-09-09 LAB — BASIC METABOLIC PANEL
Anion gap: 11 (ref 5–15)
BUN: 39 mg/dL — ABNORMAL HIGH (ref 8–23)
CO2: 21 mmol/L — ABNORMAL LOW (ref 22–32)
Calcium: 9.5 mg/dL (ref 8.9–10.3)
Chloride: 107 mmol/L (ref 98–111)
Creatinine, Ser: 1.18 mg/dL — ABNORMAL HIGH (ref 0.44–1.00)
GFR, Estimated: 46 mL/min — ABNORMAL LOW (ref >60)
Glucose, Bld: 271 mg/dL — ABNORMAL HIGH (ref 70–99)
Potassium: 3.7 mmol/L (ref 3.5–5.1)
Sodium: 139 mmol/L (ref 135–145)

## 2022-09-09 LAB — CBG MONITORING, ED
Glucose-Capillary: 166 mg/dL — ABNORMAL HIGH (ref 70–99)
Glucose-Capillary: 55 mg/dL — ABNORMAL LOW (ref 70–99)
Glucose-Capillary: 63 mg/dL — ABNORMAL LOW (ref 70–99)
Glucose-Capillary: 89 mg/dL (ref 70–99)

## 2022-09-09 LAB — CBC
HCT: 38.2 % (ref 36.0–46.0)
Hemoglobin: 12.4 g/dL (ref 12.0–15.0)
MCH: 30.5 pg (ref 26.0–34.0)
MCHC: 32.5 g/dL (ref 30.0–36.0)
MCV: 93.9 fL (ref 80.0–100.0)
Platelets: 306 10*3/uL (ref 150–400)
RBC: 4.07 MIL/uL (ref 3.87–5.11)
RDW: 13.1 % (ref 11.5–15.5)
WBC: 9.1 10*3/uL (ref 4.0–10.5)
nRBC: 0 % (ref 0.0–0.2)

## 2022-09-09 LAB — TSH: TSH: 4.602 u[IU]/mL — ABNORMAL HIGH (ref 0.350–4.500)

## 2022-09-09 LAB — TROPONIN I (HIGH SENSITIVITY): Troponin I (High Sensitivity): 6 ng/L (ref <18)

## 2022-09-09 MED ORDER — AMLODIPINE BESYLATE 10 MG PO TABS
10.0000 mg | ORAL_TABLET | Freq: Every day | ORAL | Status: DC
  Administered 2022-09-10: 10 mg via ORAL
  Filled 2022-09-09: qty 1

## 2022-09-09 MED ORDER — INSULIN ASPART 100 UNIT/ML IJ SOLN
0.0000 [IU] | Freq: Three times a day (TID) | INTRAMUSCULAR | Status: DC
  Administered 2022-09-09 – 2022-09-10 (×2): 2 [IU] via SUBCUTANEOUS
  Filled 2022-09-09 (×2): qty 1

## 2022-09-09 MED ORDER — HYDRALAZINE HCL 20 MG/ML IJ SOLN
5.0000 mg | Freq: Once | INTRAMUSCULAR | Status: AC
  Administered 2022-09-09: 5 mg via INTRAVENOUS
  Filled 2022-09-09: qty 1

## 2022-09-09 MED ORDER — LEVOTHYROXINE SODIUM 50 MCG PO TABS
50.0000 ug | ORAL_TABLET | Freq: Every day | ORAL | Status: DC
  Administered 2022-09-10: 50 ug via ORAL
  Filled 2022-09-09: qty 1

## 2022-09-09 MED ORDER — ALBUTEROL SULFATE (2.5 MG/3ML) 0.083% IN NEBU: 3.0000 mL | INHALATION_SOLUTION | RESPIRATORY_TRACT | Status: DC | PRN

## 2022-09-09 MED ORDER — ONDANSETRON HCL 4 MG/2ML IJ SOLN: 4.0000 mg | Freq: Three times a day (TID) | INTRAMUSCULAR | Status: DC | PRN

## 2022-09-09 MED ORDER — PANTOPRAZOLE SODIUM 20 MG PO TBEC
20.0000 mg | DELAYED_RELEASE_TABLET | Freq: Every day | ORAL | Status: DC
  Administered 2022-09-09 – 2022-09-10 (×2): 20 mg via ORAL
  Filled 2022-09-09 (×2): qty 1

## 2022-09-09 MED ORDER — TRAZODONE HCL 50 MG PO TABS
25.0000 mg | ORAL_TABLET | Freq: Every evening | ORAL | Status: DC | PRN
  Filled 2022-09-09: qty 1

## 2022-09-09 MED ORDER — ATORVASTATIN CALCIUM 20 MG PO TABS
20.0000 mg | ORAL_TABLET | Freq: Every day | ORAL | Status: DC
  Administered 2022-09-09 – 2022-09-10 (×2): 20 mg via ORAL
  Filled 2022-09-09 (×2): qty 1

## 2022-09-09 MED ORDER — ACETAMINOPHEN 500 MG PO TABS
1000.0000 mg | ORAL_TABLET | ORAL | Status: AC
  Administered 2022-09-09: 1000 mg via ORAL
  Filled 2022-09-09: qty 2

## 2022-09-09 MED ORDER — HYDRALAZINE HCL 20 MG/ML IJ SOLN
10.0000 mg | INTRAMUSCULAR | Status: DC | PRN
  Administered 2022-09-09: 10 mg via INTRAVENOUS
  Filled 2022-09-09: qty 1

## 2022-09-09 MED ORDER — DM-GUAIFENESIN ER 30-600 MG PO TB12: 1.0000 | ORAL_TABLET | Freq: Two times a day (BID) | ORAL | Status: DC | PRN

## 2022-09-09 MED ORDER — INSULIN ASPART 100 UNIT/ML IJ SOLN: 0.0000 [IU] | Freq: Every day | INTRAMUSCULAR | Status: DC

## 2022-09-09 MED ORDER — ACETAMINOPHEN 325 MG PO TABS
650.0000 mg | ORAL_TABLET | Freq: Four times a day (QID) | ORAL | Status: DC | PRN
  Filled 2022-09-09: qty 2

## 2022-09-09 MED ORDER — LISINOPRIL 20 MG PO TABS
40.0000 mg | ORAL_TABLET | Freq: Every day | ORAL | Status: DC
  Administered 2022-09-10: 40 mg via ORAL
  Filled 2022-09-09: qty 2

## 2022-09-09 MED ORDER — POLYVINYL ALCOHOL 1.4 % OP SOLN: 1.0000 [drp] | Freq: Four times a day (QID) | OPHTHALMIC | Status: DC | PRN

## 2022-09-09 MED ORDER — ENOXAPARIN SODIUM 40 MG/0.4ML IJ SOSY
40.0000 mg | PREFILLED_SYRINGE | INTRAMUSCULAR | Status: DC
  Filled 2022-09-09: qty 0.4

## 2022-09-09 NOTE — ED Notes (Signed)
Pt to CT

## 2022-09-09 NOTE — ED Triage Notes (Signed)
Pt to ED via POV c/o headache x 1 week. Pt also c/o cramping in her arms and neck. Pt reports that she feels very weak. Pt denies recent illness. Pt is currently in NAD.

## 2022-09-09 NOTE — ED Provider Notes (Signed)
[facility] Provider Note    Event Date/Time   First MD Initiated Contact with Patient 09/09/22 1031     (approximate)   History   Headache and Weakness   HPI  Felicia Roberts is a 82 y.o. female history of diabetes hypertension and asthma   Reviewed primary care note from April 26 the patient had a increased dose of her amlodipine. Patient is also noted to be recently on lisinopril 40 mg  Patient has had about 1 week now of a fairly intense headache at the back of her skull.  She reports it to be around the back points towards her posterior scalp/occipital area.  It is throbbing in nature, has worsened over the last week.  No numbness weakness or tingling.  She does report her vision has slightly felt blurred for the last day  No chest pain no neck pain.  No shortness of breath.  She is taking her medication and took her lisinopril as well as her amlodipine this morning before she came in  Physical Exam   Triage Vital Signs: ED Triage Vitals  Enc Vitals Group     BP 09/09/22 1004 (!) 211/84     Pulse Rate 09/09/22 1004 72     Resp 09/09/22 1004 16     Temp 09/09/22 1004 97.8 F (36.6 C)     Temp Source 09/09/22 1004 Oral     SpO2 09/09/22 1004 94 %     Weight 09/09/22 1005 123 lb (55.8 kg)     Height 09/09/22 1005 5' (1.524 m)     Head Circumference --      Peak Flow --      Pain Score 09/09/22 1005 9     Pain Loc --      Pain Edu? --      Excl. in GC? --     Most recent vital signs: Vitals:   09/09/22 1038 09/09/22 1105  BP: (!) 215/93 (!) 172/73  Pulse: 76 64  Resp: 16 20  Temp:    SpO2: 100% 100%   Spanish video interpreter utilized throughout history taking examination and follow-up  General: Awake, no distress.  She is very pleasant CV:  Good peripheral perfusion.  Normal tones and rate Resp:  Normal effort.  Clear bilateral Abd:  No distention.  Other:  No lower extremity edema Normal cranial nerve exam.  Normal  extraocular movements.  Patient reports a slight fuzziness of vision in both eyes.  There is no obvious acute cranial nerve deficits at this time.  She smiles with equal smile.  She has no pronator drift and normal sensation in all extremities.  There is no ataxia.   ED Results / Procedures / Treatments   Labs (all labs ordered are listed, but only abnormal results are displayed) Labs Reviewed  BASIC METABOLIC PANEL - Abnormal; Notable for the following components:      Result Value   CO2 21 (*)    Glucose, Bld 271 (*)    BUN 39 (*)    Creatinine, Ser 1.18 (*)    GFR, Estimated 46 (*)    All other components within normal limits  TSH - Abnormal; Notable for the following components:   TSH 4.602 (*)    All other components within normal limits  CBC  TROPONIN I (HIGH SENSITIVITY)     EKG  Entered by me at 1015 heart rate 70 QRS 80 QTc 430 Normal sinus rhythm no evidence of acute  ischemia or ectopy.   RADIOLOGY   CT head is interpreted by me as negative for acute gross intracranial pathology.  CT Head Wo Contrast  Result Date: 09/09/2022 CLINICAL DATA:  Head trauma, intracranial venous injury suspected EXAM: CT HEAD WITHOUT CONTRAST TECHNIQUE: Contiguous axial images were obtained from the base of the skull through the vertex without intravenous contrast. RADIATION DOSE REDUCTION: This exam was performed according to the departmental dose-optimization program which includes automated exposure control, adjustment of the mA and/or kV according to patient size and/or use of iterative reconstruction technique. COMPARISON:  08/14/2021 FINDINGS: Brain: No evidence of acute infarction, hemorrhage, hydrocephalus, extra-axial collection or mass lesion/mass effect. Scattered low-density changes within the periventricular and subcortical white matter most compatible with chronic microvascular ischemic change. Mild diffuse cerebral volume loss. Vascular: Atherosclerotic calcifications involving  the large vessels of the skull base. No unexpected hyperdense vessel. Skull: Normal. Negative for fracture or focal lesion. Sinuses/Orbits: Paranasal sinus mucosal thickening with air-fluid level in the left maxillary sinus. Other: None. IMPRESSION: 1. No acute intracranial abnormality. 2. Mild chronic microvascular ischemic change and cerebral volume loss. 3. Paranasal sinus disease with air-fluid level in the left maxillary sinus. Correlate for acute sinusitis. Electronically Signed   By: Duanne Guess D.O.   On: 09/09/2022 11:18      PROCEDURES:  Critical Care performed: No  Procedures   MEDICATIONS ORDERED IN ED: Medications  hydrALAZINE (APRESOLINE) injection 10 mg (has no administration in time range)  albuterol (PROVENTIL) (2.5 MG/3ML) 0.083% nebulizer solution 3 mL (has no administration in time range)  dextromethorphan-guaiFENesin (MUCINEX DM) 30-600 MG per 12 hr tablet 1 tablet (has no administration in time range)  ondansetron (ZOFRAN) injection 4 mg (has no administration in time range)  acetaminophen (TYLENOL) tablet 650 mg (has no administration in time range)  insulin aspart (novoLOG) injection 0-5 Units (has no administration in time range)  insulin aspart (novoLOG) injection 0-9 Units (has no administration in time range)  enoxaparin (LOVENOX) injection 40 mg (has no administration in time range)  hydrALAZINE (APRESOLINE) injection 5 mg (5 mg Intravenous Given 09/09/22 1050)  acetaminophen (TYLENOL) tablet 1,000 mg (1,000 mg Oral Given 09/09/22 1153)     IMPRESSION / MDM / ASSESSMENT AND PLAN / ED COURSE  I reviewed the triage vital signs and the nursing notes.                              Differential diagnosis includes, but is not limited to, hypertensive urgency versus borderline emergency, posterior reversible encephalopathy risk, hypertensive emergency, intracranial hemorrhage, aneurysmal rupture etc.  Given the somewhat slowly progressive worsening headache  occipital nature and severity of her hypertension I feel it less likely that she has an acute rupture or intracranial hemorrhage but wish to exclude by evaluation by CT of the head.  Will treat blood pressure with hydralazine and she is already on high-dose of lisinopril and has taken her medications today as well as amlodipine.    Pending CT of the head at this time  Patient's presentation is most consistent with acute complicated illness / injury requiring diagnostic workup.   The patient is on the cardiac monitor to evaluate for evidence of arrhythmia and/or significant heart rate changes.   Clinical Course as of 09/09/22 1338  Sun Sep 09, 2022  1144 Patient reports improvement in headache.  Still having some occipital headache, but quite a bit improved and reports the blurriness of vision  has resolved.  Blood pressure has improved to 172/73.  Will continue to monitor and given the degree and severity of symptoms and severity of her elevated blood pressure requiring treatment with IV antihypertensive as well as the accompanying neurologic symptoms of visual disturbance I have recommended that she be admitted to the hospital for further management and evaluation including blood pressure management.  Patient and her daughter are both agreeable [MQ]    Clinical Course User Index [MQ] Sharyn Creamer, MD   Consulted with and patient accepted to hospitalist service by Dr. Clyde Lundborg  FINAL CLINICAL IMPRESSION(S) / ED DIAGNOSES   Final diagnoses:  Hypertensive urgency, malignant  Occipital headache  Visual disturbance     Rx / DC Orders   ED Discharge Orders     None        Note:  This document was prepared using Dragon voice recognition software and may include unintentional dictation errors.   Sharyn Creamer, MD 09/09/22 [phone]

## 2022-09-09 NOTE — ED Notes (Signed)
Patients BG is 89. Patient has a snack a bed side and is refusing any other interventions with BG  at this time.

## 2022-09-09 NOTE — ED Notes (Signed)
Pt to ED for occipital HA since 1 week 7/10 with slightly blurred vision to both eyes since 2 days and cramping to both shoulders. No unilateral weakness or numbness. Pt endorses some dizziness since this morning. EDP at bedside with zoom interpreter.

## 2022-09-09 NOTE — H&P (Signed)
History and Physical    Felicia Roberts ZOX:[id number] DOB: 11/21/1940 DOA: 09/09/2022  Referring MD/NP/PA:   PCP: Barbette Reichmann, MD   Patient coming from:  The patient is coming from home.     Chief Complaint: Headache  HPI: Felicia Roberts is a 82 y.o. female with medical history significant of hypertension, hyperlipidemia, diabetes mellitus, asthma, hypothyroidism, CKD-3, diverticulosis, presents with headache.  Patient states that she has headache for more than 1 week, which is located in occipital area, constant, aching, pressure-like, nonradiating, not aggravated or alleviated by any known factors.  She has some mild blurry vision bilaterally and dizziness, denies unilateral numbness or tingling in extremities.  No facial droop or slurred speech.  No vision loss.  Patient has mild shortness of breath, no cough, chest pain, fever or chills.  Patient has nausea, no vomiting, diarrhea or abdominal pain.  No symptoms of UTI.  Patient was found to have elevated blood pressure 215/93, which improved to 155/66, after giving 5 mg of hydralazine in ED.  Along with improved blood pressure, her headache has subsided.  Her blurry vision has resolved.  Patient states that she is not taking 2.5 mg of amlodipine daily and 40 mg of lisinopril daily at home.   Data reviewed independently and ED Course: pt was found to have WBC 9.1, troponin level 6, stable renal function, temperature normal, heart rate 76, RR 20, oxygen saturation 100% on room air.  CT of head is negative for acute intracranial abnormalities.  Patient is taking anticoagulation.           EKG: I have personally reviewed.  Sinus rhythm, QTc 421, low voltage.   Review of Systems:   General: no fevers, chills, no body weight gain, has HA HEENT: no blurry vision, hearing changes or sore throat Respiratory: has mild dyspnea, no coughing, wheezing CV: no chest pain, no palpitations GI: has nausea, no vomiting, abdominal pain,  diarrhea, constipation GU: no dysuria, burning on urination, increased urinary frequency, hematuria  Ext: no leg edema Neuro: no unilateral weakness, numbness, or tingling, no vision change or hearing loss.  Has bilateral blurry vision Skin: no rash, no skin tear. MSK: No muscle spasm, no deformity, no limitation of range of movement in spin Heme: No easy bruising.  Travel history: No recent long distant travel.   Allergy:  Allergies  Allergen Reactions   Farxiga [Dapagliflozin] Anaphylaxis   Toviaz [Fesoterodine Fumarate Er] Other (See Comments)    Memory loss   Januvia [Sitagliptin] Rash    Past Medical History:  Diagnosis Date   Asthma    Chicken pox    Chronic kidney disease    Colon polyps    Diabetes mellitus without complication (HCC)    Diverticulosis    GERD (gastroesophageal reflux disease)    Hyperlipidemia    Hypertension     Past Surgical History:  Procedure Laterality Date   ARTERY BIOPSY Left 03/15/2021   Procedure: BIOPSY TEMPORAL ARTERY;  Surgeon: Carolan Shiver, MD;  Location: [facility] ORS;  Service: General;  Laterality: Left;   BLADDER SURGERY     COLONOSCOPY WITH PROPOFOL N/A 09/25/2017   Procedure: COLONOSCOPY WITH PROPOFOL;  Surgeon: Toledo, Boykin Nearing, MD;  Location: [facility] ENDOSCOPY;  Service: Gastroenterology;  Laterality: N/A;   ESOPHAGOGASTRODUODENOSCOPY N/A 12/30/2020   Procedure: ESOPHAGOGASTRODUODENOSCOPY (EGD);  Surgeon: Jaynie Collins, DO;  Location: [facility] END[facility]COPY;  Service: Gastroenterology;  Laterality: N/A;   ESOPHAGOGASTRODUODENOSCOPY (EGD) WITH PROPOFOL N/A 09/25/2017   Procedure: ESOPHAGOGASTRODUODENOSCOPY (EGD) WITH PROPOFOL;  Surgeon:  Toledo, Boykin Nearing, MD;  Location: [facility] ENDOSCOPY;  Service: Gastroenterology;  Laterality: N/A;   EYE SURGERY     URETHRAL DIVERTICULUM REPAIR  1980    Social History:  reports that she has never smoked. She has never used smokeless tobacco. She reports that she does not drink alcohol and  does not use drugs.  Family History:  Family History  Problem Relation Age of Onset   Cancer Father        prostate   Diabetes Sister    Hypertension Sister    AAA (abdominal aortic aneurysm) Brother    Diabetes Sister    Hypertension Sister    Diabetes Sister    Hypertension Sister    Diabetes Sister    Hypertension Sister    Hypertension Mother    Heart disease Mother    Diabetes Mother    Breast cancer Neg Hx      Prior to Admission medications   Medication Sig Start Date End Date Taking? Authorizing Provider  ADVAIR HFA 115-21 MCG/ACT inhaler Inhale 2 puffs into the lungs every 12 (twelve) hours. 06/06/20   [provider]  albuterol (PROVENTIL HFA;VENTOLIN HFA) 108 (90 Base) MCG/ACT inhaler INHALE ONE TO TWO PUFFS BY MOUTH EVERY 6 HOURS AS NEEDED FOR WHEEZING OR  SHORTNESS  OF  BREATH 12/19/17   Glori Luis, MD  albuterol (PROVENTIL) (2.5 MG/3ML) 0.083% nebulizer solution Take 3 mLs (2.5 mg total) by nebulization every 6 (six) hours as needed for wheezing or shortness of breath. 01/04/16   Everlene Other G, DO  amLODipine (NORVASC) 5 MG tablet TAKE 1 TABLET BY MOUTH ONCE DAILY 04/21/18   Glori Luis, MD  amoxicillin-clavulanate (AUGMENTIN) 875-125 MG tablet Take 1 tablet by mouth 2 (two) times daily. 12/12/21   [provider]  aspirin EC 81 MG tablet Take by mouth.    [provider]  atorvastatin (LIPITOR) 40 MG tablet Take 1 tablet (40 mg total) by mouth daily. 09/04/17   Glori Luis, MD  Azelastine HCl 137 MCG/SPRAY SOLN SMARTSIG:1-2 Spray(s) Both Nares Every 12 Hours PRN 12/12/21   [provider]  blood glucose meter kit and supplies See admin instructions. 11/06/21   [provider]  cefUROXime (CEFTIN) 250 MG tablet Take 250 mg by mouth 2 (two) times daily with a meal.    [provider]  ciprofloxacin (CIPRO) 250 MG tablet Take 250 mg by mouth 2 (two) times daily. 11/10/21   [provider]   clobetasol (OLUX) 0.05 % topical foam Apply topically daily.    [provider]  cyclobenzaprine (FLEXERIL) 5 MG tablet Take 1 tablet (5 mg total) by mouth 3 (three) times daily as needed for muscle spasms. 04/20/22   Jene Every, MD  Docusate Sodium (DSS) 100 MG CAPS Take 1 capsule by mouth 2 (two) times daily. 04/09/22   [provider]  EUTHYROX 50 MCG tablet Take 50 mcg by mouth every morning. 10/21/20   [provider]  fluconazole (DIFLUCAN) 150 MG tablet Take by mouth. 03/15/22   [provider]  fluticasone (FLONASE) 50 MCG/ACT nasal spray Place 2 sprays into both nostrils daily. 12/12/21   [provider]  FREESTYLE LITE test strip USE 1 STRIP TO CHECK GLUCOSE TWICE DAILY    [provider]  gabapentin (NEURONTIN) 100 MG capsule Take 100 mg by mouth at bedtime. 08/17/20   [provider]  glipiZIDE (GLUCOTROL XL) 5 MG 24 hr tablet Take 5 mg by mouth  daily. 08/17/20   [provider]  levothyroxine (SYNTHROID) 25 MCG tablet Take 25 mcg by mouth daily before breakfast.    [provider]  lisinopril (PRINIVIL,ZESTRIL) 20 MG tablet TAKE 1 TABLET BY MOUTH ONCE DAILY 04/21/18   Glori Luis, MD  metFORMIN (GLUCOPHAGE-XR) 500 MG 24 hr tablet Take 1,000 mg by mouth daily after supper.     [provider]  MIEBO 1.338 GM/ML SOLN Apply 1 drop to eye 4 (four) times daily. 01/29/22   [provider]  mometasone (ELOCON) 0.1 % lotion SMARTSIG:2-4 Drop(s) In Ear(s) Daily PRN 12/12/21   [provider]  naproxen (NAPROSYN) 500 MG tablet Take 1 tablet (500 mg total) by mouth 2 (two) times daily with a meal. 04/20/22   Jene Every, MD  omeprazole (PRILOSEC) 20 MG capsule Take 20 mg by mouth daily as needed. 04/06/22   [provider]  pantoprazole (PROTONIX) 40 MG tablet Take 40 mg by mouth daily.    [provider]  pioglitazone (ACTOS) 30 MG tablet Take 30 mg by mouth daily.  10/24/20   [provider]  predniSONE (DELTASONE) 10 MG tablet Take by mouth. 04/25/22   [provider]    Physical Exam: Vitals:   09/09/22 1015 09/09/22 1038 09/09/22 1038 09/09/22 1105  BP: (!) 143/118  (!) 215/93 (!) 172/73  Pulse: 61 76 76 64  Resp:  16 16 20   Temp:      TempSrc:      SpO2: 99% 100% 100% 100%  Weight:      Height:       General: Not in acute distress HEENT:       Eyes: PERRL, EOMI, no jaundice       ENT: No discharge from the ears and nose, no pharynx injection, no tonsillar enlargement.        Neck: No JVD, no bruit, no mass felt. Heme: No neck lymph node enlargement. Cardiac: S1/S2, RRR, No murmurs, No gallops or rubs. Respiratory: No rales, wheezing, rhonchi or rubs. GI: Soft, nondistended, nontender, no rebound pain, no organomegaly, BS present. GU: No hematuria Ext: No pitting leg edema bilaterally. 1+DP/PT pulse bilaterally. Musculoskeletal: No joint deformities, No joint redness or warmth, no limitation of ROM in spin. Skin: No rashes.  Neuro: Alert, oriented X3, cranial nerves II-XII grossly intact, moves all extremities normally. Psych: Patient is not psychotic, no suicidal or hemocidal ideation.  Labs on Admission: I have personally reviewed following labs and imaging studies  CBC: Recent Labs  Lab 09/09/22 1027  WBC 9.1  HGB 12.4  HCT 38.2  MCV 93.9  PLT 306   Basic Metabolic Panel: Recent Labs  Lab 09/09/22 1027  NA 139  K 3.7  CL 107  CO2 21*  GLUCOSE 271*  BUN 39*  CREATININE 1.18*  CALCIUM 9.5   GFR: Estimated Creatinine Clearance: 29.3 mL/min (A) (by C-G formula based on SCr of 1.18 mg/dL (H)). Liver Function Tests: No results for input(s): "AST", "ALT", "ALKPHOS", "BILITOT", "PROT", "ALBUMIN" in the last 168 hours. No results for input(s): "LIPASE", "AMYLASE" in the last 168 hours. No results for input(s): "AMMONIA" in the last 168 hours. Coagulation Profile: No results for input(s): "INR",  "PROTIME" in the last 168 hours. Cardiac Enzymes: No results for input(s): "CKTOTAL", "CKMB", "CKMBINDEX", "TROPONINI" in the last 168 hours. BNP (last 3 results) No results for input(s): "PROBNP" in the last 8760 hours. HbA1C: No results for input(s): "HGBA1C" in the last 72 hours. CBG: No  results for input(s): "GLUCAP" in the last 168 hours. Lipid Profile: No results for input(s): "CHOL", "HDL", "LDLCALC", "TRIG", "CHOLHDL", "LDLDIRECT" in the last 72 hours. Thyroid Function Tests: Recent Labs    09/09/22 1027  TSH 4.602*   Anemia Panel: No results for input(s): "VITAMINB12", "FOLATE", "FERRITIN", "TIBC", "IRON", "RETICCTPCT" in the last 72 hours. Urine analysis:    Component Value Date/Time   COLORURINE STRAW (A) 04/26/2022 1058   APPEARANCEUR CLEAR (A) 04/26/2022 1058   APPEARANCEUR Clear 12/31/2017 0827   LABSPEC 1.007 04/26/2022 1058   LABSPEC 1.011 01/14/2013 1230   PHURINE 7.0 04/26/2022 1058   GLUCOSEU 50 (A) 04/26/2022 1058   GLUCOSEU Negative 01/14/2013 1230   HGBUR NEGATIVE 04/26/2022 1058   BILIRUBINUR NEGATIVE 04/26/2022 1058   BILIRUBINUR Negative 12/31/2017 0827   BILIRUBINUR Negative 01/14/2013 1230   KETONESUR 5 (A) 04/26/2022 1058   PROTEINUR >=300 (A) 04/26/2022 1058   NITRITE NEGATIVE 04/26/2022 1058   LEUKOCYTESUR NEGATIVE 04/26/2022 1058   LEUKOCYTESUR Negative 01/14/2013 1230   Sepsis Labs: @LABRCNTIP (procalcitonin:4,lacticidven:4) )No results found for this or any previous visit (from the past 240 hour(s)).   Radiological Exams on Admission: CT Head Wo Contrast  Result Date: 09/09/2022 CLINICAL DATA:  Head trauma, intracranial venous injury suspected EXAM: CT HEAD WITHOUT CONTRAST TECHNIQUE: Contiguous axial images were obtained from the base of the skull through the vertex without intravenous contrast. RADIATION DOSE REDUCTION: This exam was performed according to the departmental dose-optimization program which includes automated exposure  control, adjustment of the mA and/or kV according to patient size and/or use of iterative reconstruction technique. COMPARISON:  08/14/2021 FINDINGS: Brain: No evidence of acute infarction, hemorrhage, hydrocephalus, extra-axial collection or mass lesion/mass effect. Scattered low-density changes within the periventricular and subcortical white matter most compatible with chronic microvascular ischemic change. Mild diffuse cerebral volume loss. Vascular: Atherosclerotic calcifications involving the large vessels of the skull base. No unexpected hyperdense vessel. Skull: Normal. Negative for fracture or focal lesion. Sinuses/Orbits: Paranasal sinus mucosal thickening with air-fluid level in the left maxillary sinus. Other: None. IMPRESSION: 1. No acute intracranial abnormality. 2. Mild chronic microvascular ischemic change and cerebral volume loss. 3. Paranasal sinus disease with air-fluid level in the left maxillary sinus. Correlate for acute sinusitis. Electronically Signed   By: Duanne Guess D.O.   On: 09/09/2022 11:18      Assessment/Plan Principal Problem:   Hypertensive urgency Active Problems:   Essential hypertension   Headache   Hyperlipidemia   Asthma   Type II diabetes mellitus with renal manifestations (HCC)   Hypothyroidism   Chronic kidney disease, stage 3a (HCC)   Assessment and Plan:  Hypertensive urgency and hx of essential hypertension: Initial blood pressure 215/93, which improved to 172/73 --> 155/66 after giving 5 mg of hydralazine IV.  -Place in telemetry bed for observation -IV hydralazine as needed -Increase amlodipine dose from 2.5 mg to 10 mg daily -Continue lisinopril 40 mg daily  Headache: Headache has subsided when blood pressure is controlled, indicating the headache is likely secondary to hypertensive urgency.  Low suspicions for temporal arteritis given the location of occipital area and the improvement with blood pressure control. -As needed  Tylenol -Blood pressure control as above  Hyperlipidemia -lipitor  Asthma: stable -Bronchodilators  Type II diabetes mellitus with renal manifestations [facility]): Recent A1c 9.3, poorly controlled.  Patient taking glipizide and Actos at home -SSI  Hypothyroidism -Synthroid  Chronic kidney disease, stage 3a (HCC): Stable -Follow-up with BMP      DVT ppx:  SQ Lovenox  Code Status: Full code   Family Communication:  Yes, patient's daughter at bed side.   Disposition Plan:  Anticipate discharge back to previous environment  Consults called:  none  Admission status and Level of care: Telemetry Medical:   for obs    Dispo: The patient is from: Home              Anticipated d/c is to: Home              Anticipated d/c date is: 1 day              Patient currently is not medically stable to d/c.    Severity of Illness:  The appropriate patient status for this patient is OBSERVATION. Observation status is judged to be reasonable and necessary in order to provide the required intensity of service to ensure the patient's safety. The patient's presenting symptoms, physical exam findings, and initial radiographic and laboratory data in the context of their medical condition is felt to place them at decreased risk for further clinical deterioration. Furthermore, it is anticipated that the patient will be medically stable for discharge from the hospital within 2 midnights of admission.        Date of Service 09/09/2022    Lorretta Harp [facility]   If 7PM-7AM, please contact night-coverage [email or web address] 09/09/2022, 2:33 PM

## 2022-09-09 NOTE — ED Notes (Signed)
Provider notified : Pt stated her BG was low. I checked and it was 55. Patient requested OJ. I made ED provider aware of situation. I rechecked her BG and she is at 63. She is requesting graham crackers and more OJ. She states "this happens sometimes and I will be fine."

## 2022-09-10 ENCOUNTER — Encounter
Admission: EM | Disposition: A | Discharge status: Home / Self Care | Payer: Self-pay | Source: Home / Self Care | Attending: Emergency Medicine

## 2022-09-10 ENCOUNTER — Ambulatory Visit: Admission: RE | Admit: 2022-09-10 | Payer: Medicare HMO | Source: Home / Self Care | Admitting: Gastroenterology

## 2022-09-10 DIAGNOSIS — E1122 Type 2 diabetes mellitus with diabetic chronic kidney disease: Secondary | ICD-10-CM | POA: Diagnosis not present

## 2022-09-10 DIAGNOSIS — G4489 Other headache syndrome: Secondary | ICD-10-CM

## 2022-09-10 DIAGNOSIS — I160 Hypertensive urgency: Secondary | ICD-10-CM | POA: Diagnosis not present

## 2022-09-10 DIAGNOSIS — E039 Hypothyroidism, unspecified: Secondary | ICD-10-CM | POA: Diagnosis not present

## 2022-09-10 LAB — GLUCOSE, CAPILLARY: Glucose-Capillary: 162 mg/dL — ABNORMAL HIGH (ref 70–99)

## 2022-09-10 SURGERY — ESOPHAGOGASTRODUODENOSCOPY (EGD) WITH PROPOFOL
Anesthesia: General

## 2022-09-10 MED ORDER — AMLODIPINE BESYLATE 5 MG PO TABS
10.0000 mg | ORAL_TABLET | Freq: Every day | ORAL | 0 refills | Status: AC
Start: 2022-09-10 — End: ?

## 2022-09-10 MED ORDER — LEVOTHYROXINE SODIUM 50 MCG PO TABS: 50.0000 ug | ORAL_TABLET | Freq: Every day | ORAL | 0 refills | Status: AC

## 2022-09-10 MED ORDER — LISINOPRIL 20 MG PO TABS
20.0000 mg | ORAL_TABLET | Freq: Every day | ORAL | 0 refills | Status: DC
Start: 2022-09-10 — End: 2023-12-19

## 2022-09-10 NOTE — Plan of Care (Signed)
Problem: Education: Goal: Ability to describe self-care measures that may prevent or decrease complications (Diabetes Survival Skills Education) will improve 09/10/2022 1043 by Harjit Leider Bet, LPN Outcome: Progressing 09/10/2022 1042 by Alene Bergerson Bet, LPN Outcome: Progressing Goal: Individualized Educational Video(s) 09/10/2022 1043 by Khasir Woodrome Bet, LPN Outcome: Progressing 09/10/2022 1042 by Manasi Dishon Bet, LPN Outcome: Progressing   Problem: Coping: Goal: Ability to adjust to condition or change in health will improve 09/10/2022 1043 by Marlynn Hinckley Bet, LPN Outcome: Progressing 09/10/2022 1042 by Arilyn Brierley Bet, LPN Outcome: Progressing   Problem: Fluid Volume: Goal: Ability to maintain a balanced intake and output will improve 09/10/2022 1043 by Nivek Powley Bet, LPN Outcome: Progressing 09/10/2022 1042 by Deamonte Sayegh Bet, LPN Outcome: Progressing   Problem: Health Behavior/Discharge Planning: Goal: Ability to identify and utilize available resources and services will improve 09/10/2022 1043 by Wahneta Derocher Bet, LPN Outcome: Progressing 09/10/2022 1042 by Ambrosio Reuter Bet, LPN Outcome: Progressing Goal: Ability to manage health-related needs will improve 09/10/2022 1043 by Eugene Isadore Bet, LPN Outcome: Progressing 09/10/2022 1042 by Oryn Casanova Bet, LPN Outcome: Progressing   Problem: Metabolic: Goal: Ability to maintain appropriate glucose levels will improve 09/10/2022 1043 by Demarr Kluever Bet, LPN Outcome: Progressing 09/10/2022 1042 by Rosmarie Esquibel Bet, LPN Outcome: Progressing   Problem: Nutritional: Goal: Maintenance of adequate nutrition will improve 09/10/2022 1043 by Naythen Heikkila Bet, LPN Outcome: Progressing 09/10/2022 1042 by Hensley Aziz Bet, LPN Outcome: Progressing Goal: Progress toward achieving an optimal weight will improve 09/10/2022 1043 by Gavriel Holzhauer Bet, LPN Outcome: Progressing 09/10/2022 1042 by Sylvie Mifsud Bet, LPN Outcome: Progressing   Problem: Skin  Integrity: Goal: Risk for impaired skin integrity will decrease 09/10/2022 1043 by Giulianna Rocha Bet, LPN Outcome: Progressing 09/10/2022 1042 by Ciro Tashiro Bet, LPN Outcome: Progressing   Problem: Tissue Perfusion: Goal: Adequacy of tissue perfusion will improve 09/10/2022 1043 by Braydon Kullman Bet, LPN Outcome: Progressing 09/10/2022 1042 by Latrease Kunde Bet, LPN Outcome: Progressing   Problem: Education: Goal: Knowledge of General Education information will improve Description: Including pain rating scale, medication(s)/side effects and non-pharmacologic comfort measures 09/10/2022 1043 by Mahasin Riviere Bet, LPN Outcome: Progressing 09/10/2022 1042 by Cuthbert Turton Bet, LPN Outcome: Progressing   Problem: Health Behavior/Discharge Planning: Goal: Ability to manage health-related needs will improve 09/10/2022 1043 by Markanthony Gedney Bet, LPN Outcome: Progressing 09/10/2022 1042 by Autumm Hattery Bet, LPN Outcome: Progressing   Problem: Clinical Measurements: Goal: Ability to maintain clinical measurements within normal limits will improve 09/10/2022 1043 by Oleva Koo Bet, LPN Outcome: Progressing 09/10/2022 1042 by Leda Bellefeuille Bet, LPN Outcome: Progressing Goal: Will remain free from infection 09/10/2022 1043 by Alaria Oconnor Bet, LPN Outcome: Progressing 09/10/2022 1042 by Kelsy Polack Bet, LPN Outcome: Progressing Goal: Diagnostic test results will improve 09/10/2022 1043 by Zarahi Fuerst Bet, LPN Outcome: Progressing 09/10/2022 1042 by Mervyn Pflaum Bet, LPN Outcome: Progressing Goal: Respiratory complications will improve 09/10/2022 1043 by Rodgers Likes Bet, LPN Outcome: Progressing 09/10/2022 1042 by Bertha Lokken Bet, LPN Outcome: Progressing Goal: Cardiovascular complication will be avoided 09/10/2022 1043 by Shakur Lembo Bet, LPN Outcome: Progressing 09/10/2022 1042 by Deago Burruss Bet, LPN Outcome: Progressing   Problem: Activity: Goal: Risk for activity intolerance will decrease 09/10/2022 1043 by Rhonda Vangieson  Bet, LPN Outcome: Progressing 09/10/2022 1042 by Alisha Bacus Bet, LPN Outcome: Progressing   Problem: Nutrition: Goal: Adequate nutrition will be maintained 09/10/2022 1043 by Jamesa Tedrick Bet, LPN Outcome: Progressing 09/10/2022 1042 by Reichen Hutzler Bet, LPN Outcome: Progressing  Problem: Coping: Goal: Level of anxiety will decrease 09/10/2022 1043 by Emberlie Gotcher Bet, LPN Outcome: Progressing 09/10/2022 1042 by Shirlie Enck Bet, LPN Outcome: Progressing   Problem: Elimination: Goal: Will not experience complications related to bowel motility 09/10/2022 1043 by Camyah Pultz Bet, LPN Outcome: Progressing 09/10/2022 1042 by Yazlyn Wentzel Bet, LPN Outcome: Progressing Goal: Will not experience complications related to urinary retention 09/10/2022 1043 by Isak Sotomayor Bet, LPN Outcome: Progressing 09/10/2022 1042 by Orma Cheetham Bet, LPN Outcome: Progressing   Problem: Pain Managment: Goal: General experience of comfort will improve 09/10/2022 1043 by Amon Costilla Bet, LPN Outcome: Progressing 09/10/2022 1042 by Skylynne Schlechter Bet, LPN Outcome: Progressing   Problem: Safety: Goal: Ability to remain free from injury will improve 09/10/2022 1043 by Dicky Boer Bet, LPN Outcome: Progressing 09/10/2022 1042 by Jelan Batterton Bet, LPN Outcome: Progressing   Problem: Skin Integrity: Goal: Risk for impaired skin integrity will decrease 09/10/2022 1043 by Perel Hauschild Bet, LPN Outcome: Progressing 09/10/2022 1042 by Vetra Shinall Bet, LPN Outcome: Progressing

## 2022-09-10 NOTE — Progress Notes (Signed)
Patient discharged from inpatient room this morning.  Per Dr Timothy Lasso, the patient's scheduled Upper Endoscopy to be rescheduled to a later date due to recent hospitalization and since the patient has not been NPO.  Patient informed that the office will call her for a new date.

## 2022-09-10 NOTE — Progress Notes (Signed)
Reviewed d/c instructions with pt. Pt verbalized understanding. Pt d/ced with all personal belongings. Staff walked pt out. Pt transported to home via family car.

## 2022-09-10 NOTE — Discharge Summary (Signed)
Physician Discharge Summary   Patient: Felicia Roberts MRN: [id number] DOB: [birth year]  Admit date:     09/09/2022  Discharge date: 09/10/22  Discharge Physician: Marcelino Duster   PCP: Barbette Reichmann, MD   Recommendations at discharge:    PCP follow up in 1 week. Keep log of BP, blood sugars. Compliance with medication counseled.  Discharge Diagnoses: Principal Problem:   Hypertensive urgency Active Problems:   Essential hypertension   Headache   Hyperlipidemia   Asthma   Type II diabetes mellitus with renal manifestations (HCC)   Hypothyroidism   Chronic kidney disease, stage 3a (HCC)  Resolved Problems:   * No resolved hospital problems. *  Hospital Course: Felicia Roberts is a 82 y.o. female with medical history significant of hypertension, hyperlipidemia, diabetes mellitus, asthma, hypothyroidism, CKD-3, diverticulosis, presents with headache.   Patient states that she has headache for more than 1 week, which is located in occipital area, constant, aching, pressure-like, nonradiating, not aggravated or alleviated by any known factors.  She has some mild blurry vision bilaterally and dizziness, denies unilateral numbness or tingling in extremities.  No facial droop or slurred speech.  No vision loss.  Patient has mild shortness of breath, no cough, chest pain, fever or chills.  Patient has nausea, no vomiting, diarrhea or abdominal pain.  No symptoms of UTI.   Patient was found to have elevated blood pressure 215/93, which improved to 155/66, after giving 5 mg of hydralazine in ED.  Along with improved blood pressure, her headache has subsided.  Her blurry vision has resolved.  Patient states that she is not taking 2.5 mg of amlodipine daily and 40 mg of lisinopril daily at home.  Patient is admitted with hypertensive urgency, started on amlodipine, lisinopril, IV hydralazine therapy as needed.  Patient's symptoms much improved, no more headaches.  Patient would  like to be discharged.  Advised her to continue amlodipine 10 mg along with lisinopril and keep a log of blood pressures for PCP to address antihypertensive medications.  Advised to stop glipizide due to her low blood sugars.  Patient understands to follow-up with PCP upon discharge as instructed.  Patient and daughter understand and agree with the discharge plan.        Consultants: none Procedures performed: none  Disposition: Home Diet recommendation:  Discharge Diet Orders (From admission, onward)     Start     Ordered   09/10/22 0000  Diet - low sodium heart healthy        09/10/22 1033   09/10/22 0000  Diet Carb Modified        09/10/22 1033           Cardiac and Carb modified diet DISCHARGE MEDICATION: Allergies as of 09/10/2022       Reactions   Farxiga [dapagliflozin] Anaphylaxis   Toviaz [fesoterodine Fumarate Er] Other (See Comments)   Memory loss   Januvia [sitagliptin] Rash        Medication List     STOP taking these medications    cyclobenzaprine 5 MG tablet Commonly known as: FLEXERIL   glipiZIDE 5 MG 24 hr tablet Commonly known as: GLUCOTROL XL   naproxen 500 MG tablet Commonly known as: Naprosyn       TAKE these medications    albuterol 108 (90 Base) MCG/ACT inhaler Commonly known as: VENTOLIN HFA INHALE ONE TO TWO PUFFS BY MOUTH EVERY 6 HOURS AS NEEDED FOR WHEEZING OR  SHORTNESS  OF  BREATH   amLODipine 5 MG  tablet Commonly known as: NORVASC Take 2 tablets (10 mg total) by mouth daily. What changed: how much to take   atorvastatin 40 MG tablet Commonly known as: LIPITOR Take 1 tablet (40 mg total) by mouth daily. What changed: how much to take   hydroxypropyl methylcellulose / hypromellose 2.5 % ophthalmic solution Commonly known as: ISOPTO TEARS / GONIOVISC Place 1 drop into both eyes 4 (four) times daily as needed for dry eyes.   levothyroxine 50 MCG tablet Commonly known as: SYNTHROID Take 1 tablet (50 mcg total) by  mouth daily before breakfast.   lisinopril 20 MG tablet Commonly known as: ZESTRIL Take 1 tablet (20 mg total) by mouth daily. What changed: how much to take   pantoprazole 20 MG tablet Commonly known as: PROTONIX Take 20 mg by mouth daily.   pioglitazone 30 MG tablet Commonly known as: ACTOS Take 30 mg by mouth daily.        Discharge Exam: Filed Weights   09/09/22 1005  Weight: 55.8 kg   General -elderly Hispanic female, no apparent distress HEENT - PERRLA, EOMI, atraumatic head, non tender sinuses. Lung - Clear, rales, rhonchi, wheezes. Heart - S1, S2 heard, no murmurs, rubs, no pedal edema Neuro - Alert, awake and oriented x 3, non focal exam. Skin - Warm and dry.  Condition at discharge: stable  The results of significant diagnostics from this hospitalization (including imaging, microbiology, ancillary and laboratory) are listed below for reference.   Imaging Studies: CT Head Wo Contrast  Result Date: 09/09/2022 CLINICAL DATA:  Head trauma, intracranial venous injury suspected EXAM: CT HEAD WITHOUT CONTRAST TECHNIQUE: Contiguous axial images were obtained from the base of the skull through the vertex without intravenous contrast. RADIATION DOSE REDUCTION: This exam was performed according to the departmental dose-optimization program which includes automated exposure control, adjustment of the mA and/or kV according to patient size and/or use of iterative reconstruction technique. COMPARISON:  08/14/2021 FINDINGS: Brain: No evidence of acute infarction, hemorrhage, hydrocephalus, extra-axial collection or mass lesion/mass effect. Scattered low-density changes within the periventricular and subcortical white matter most compatible with chronic microvascular ischemic change. Mild diffuse cerebral volume loss. Vascular: Atherosclerotic calcifications involving the large vessels of the skull base. No unexpected hyperdense vessel. Skull: Normal. Negative for fracture or focal  lesion. Sinuses/Orbits: Paranasal sinus mucosal thickening with air-fluid level in the left maxillary sinus. Other: None. IMPRESSION: 1. No acute intracranial abnormality. 2. Mild chronic microvascular ischemic change and cerebral volume loss. 3. Paranasal sinus disease with air-fluid level in the left maxillary sinus. Correlate for acute sinusitis. Electronically Signed   By: Duanne Guess D.O.   On: 09/09/2022 11:18    Microbiology: Results for orders placed or performed during the hospital encounter of 11/10/20  Resp Panel by RT-PCR (Flu A&B, Covid) Nasopharyngeal Swab     Status: None   Collection Time: 11/10/20  5:33 PM   Specimen: Nasopharyngeal Swab; Nasopharyngeal(NP) swabs in vial transport medium  Result Value Ref Range Status   SARS Coronavirus 2 by RT PCR NEGATIVE NEGATIVE Final    Comment: (NOTE) SARS-CoV-2 target nucleic acids are NOT DETECTED.  The SARS-CoV-2 RNA is generally detectable in upper respiratory specimens during the acute phase of infection. The lowest concentration of SARS-CoV-2 viral copies this assay can detect is 138 copies/mL. A negative result does not preclude SARS-Cov-2 infection and should not be used as the sole basis for treatment or other patient management decisions. A negative result may occur with  improper specimen collection/handling, submission  of specimen other than nasopharyngeal swab, presence of viral mutation(s) within the areas targeted by this assay, and inadequate number of viral copies(<138 copies/mL). A negative result must be combined with clinical observations, patient history, and epidemiological information. The expected result is Negative.  Fact Sheet for Patients:  BloggerCourse.com  Fact Sheet for Healthcare Providers:  SeriousBroker.it  This test is no t yet approved or cleared by the [place] FDA and  has been authorized for detection and/or diagnosis of SARS-CoV-2  by FDA under an Emergency Use Authorization (EUA). This EUA will remain  in effect (meaning this test can be used) for the duration of the COVID-19 declaration under Section 564(b)(1) of the Act, 21 U.S.C.section 360bbb-3(b)(1), unless the authorization is terminated  or revoked sooner.       Influenza A by PCR NEGATIVE NEGATIVE Final   Influenza B by PCR NEGATIVE NEGATIVE Final    Comment: (NOTE) The Xpert Xpress SARS-CoV-2/FLU/RSV plus assay is intended as an aid in the diagnosis of influenza from Nasopharyngeal swab specimens and should not be used as a sole basis for treatment. Nasal washings and aspirates are unacceptable for Xpert Xpress SARS-CoV-2/FLU/RSV testing.  Fact Sheet for Patients: BloggerCourse.com  Fact Sheet for Healthcare Providers: SeriousBroker.it  This test is not yet approved or cleared by the [place] FDA and has been authorized for detection and/or diagnosis of SARS-CoV-2 by FDA under an Emergency Use Authorization (EUA). This EUA will remain in effect (meaning this test can be used) for the duration of the COVID-19 declaration under Section 564(b)(1) of the Act, 21 U.S.C. section 360bbb-3(b)(1), unless the authorization is terminated or revoked.  Performed at [facility], [address] [place], [place] [address]     Labs: CBC: Recent Labs  Lab 09/09/22 1027  WBC 9.1  HGB 12.4  HCT 38.2  MCV 93.9  PLT 306   Basic Metabolic Panel: Recent Labs  Lab 09/09/22 1027  NA 139  K 3.7  CL 107  CO2 21*  GLUCOSE 271*  BUN 39*  CREATININE 1.18*  CALCIUM 9.5   Liver Function Tests: No results for input(s): "AST", "ALT", "ALKPHOS", "BILITOT", "PROT", "ALBUMIN" in the last 168 hours. CBG: Recent Labs  Lab 09/09/22 1653 09/09/22 1949 09/09/22 2003 09/09/22 2020 09/10/22 0808  GLUCAP 166* 55* 63* 89 162*    Discharge time spent: greater than 30  minutes.  Signed: Marcelino Duster, MD [facility] 09/10/2022

## 2023-05-29 ENCOUNTER — Encounter: Payer: Self-pay | Admitting: Dietician

## 2023-05-29 ENCOUNTER — Encounter: Payer: Medicare HMO | Attending: Endocrinology | Admitting: Dietician

## 2023-05-29 VITALS — Ht 60.0 in | Wt 122.0 lb

## 2023-05-29 DIAGNOSIS — Z713 Dietary counseling and surveillance: Secondary | ICD-10-CM | POA: Diagnosis present

## 2023-05-29 DIAGNOSIS — E1165 Type 2 diabetes mellitus with hyperglycemia: Secondary | ICD-10-CM | POA: Insufficient documentation

## 2023-05-29 DIAGNOSIS — E1122 Type 2 diabetes mellitus with diabetic chronic kidney disease: Secondary | ICD-10-CM

## 2023-05-29 NOTE — Patient Instructions (Signed)
 Try [place] sausage Vilma Meckel) as a protein for breakfast along with oatmeal or bread. Limit to 1-2 pieces to keep sodium in control. Eat a protein food + a small amount of a starchy food + a vegetable or fruit with each meal, 3 times a day.  Eat something every 3-5 hours during the day.  If awake for more than 3 hours after dinner, eat a small snack with protein, like yogurt, or fruit and a few nuts, or a protein drink. Do some light exercise for 10 minutes or longer most days, or walk for a few minutes after each meal. Make sure to eat 1 hour before exercise so blood sugar does not drop too low. If it has been more than 2 hours after a meal, then eat a snack of fruit and a piece of cheese or a few nuts.

## 2023-05-29 NOTE — Progress Notes (Signed)
 Diabetes Self-Management Education  Visit Type: First/Initial  Appt. Start Time: 1000 Appt. End Time: 1115  05/29/2023  [name], identified by name and date of birth, is a 83 y.o. female with a diagnosis of Diabetes: Type 2.   ASSESSMENT  Height 5' (1.524 m), weight 122 lb (55.3 kg). Body mass index is 23.83 kg/m.   Diabetes Self-Management Education - 05/29/23 1028       Initial Visit   Diabetes Type Type 2    Date Diagnosed many years per patient    Are you currently following a meal plan? No    Are you taking your medications as prescribed? Yes      Psychosocial Assessment   Patient Belief/Attitude about Diabetes Motivated to manage diabetes    Self-care barriers None    Patient Concerns Nutrition/Meal planning;Glycemic Control    Special Needs None    Learning Readiness Change in progress    How often do you need to have someone help you when you read instructions, pamphlets, or other written materials from your doctor or pharmacy? 1 - Never    What is the last grade level you completed in school? 2 years college      Pre-Education Assessment   Patient understands the diabetes disease and treatment process. Needs Review    Patient understands incorporating nutritional management into lifestyle. Needs Review    Patient undertands incorporating physical activity into lifestyle. Comprehends key points    Patient understands using medications safely. Comprehends key points    Patient understands prevention, detection, and treatment of acute complications. Needs Review    Patient understands prevention, detection, and treatment of chronic complications. Needs Review    Patient understands how to develop strategies to address psychosocial issues. Needs Review    Patient understands how to develop strategies to promote health/change behavior. Comprehends key points      Complications   Last HgB A1C per patient/outside source 10.2 %   05/09/23   How often do you  check your blood sugar? 0 times/day (not testing)   became frustrated wtih high readings   Number of hypoglycemic episodes per month 0    Have you had a dilated eye exam in the past 12 months? Yes    Have you had a dental exam in the past 12 months? Yes    Are you checking your feet? No      Dietary Intake   Breakfast late morning, occasionally skips 1 slice bread, sometimes 1 boiled egg, coffee with almond milk    Snack (morning) none    Lunch often late, as late as 5pm sandwich or salad including chicken salad    Snack (afternoon) none    Dinner chicken in air fryer + rice and/or salad lettuce, tomato    Snack (evening) none    Beverage(s) water, coffee in am      Activity / Exercise   Activity / Exercise Type ADL's   more sedentary in cold weather due to asthma; has gym membership     Patient Education   Previous Diabetes Education Yes (please comment)   [facility] NDES 2017   Disease Pathophysiology Explored patient's options for treatment of their diabetes    Healthy Eating Role of diet in the treatment of diabetes and the relationship between the three main macronutrients and blood glucose level;Food label reading, portion sizes and measuring food.;Plate Method;Meal timing in regards to the patients' current diabetes medication.;Meal options for control of blood glucose level and chronic complications.  Being Active Role of exercise on diabetes management, blood pressure control and cardiac health.;Helped patient identify appropriate exercises in relation to his/her diabetes, diabetes complications and other health issue.    Medications Reviewed patients medication for diabetes, action, purpose, timing of dose and side effects.    Monitoring Interpreting lab values - A1C, lipid, urine microalbumina.;Identified appropriate SMBG and/or A1C goals.    Chronic complications Relationship between chronic complications and blood glucose control    Diabetes Stress and Support Identified and  addressed patients feelings and concerns about diabetes      Outcomes   Expected Outcomes Demonstrated interest in learning. Expect positive outcomes    Future DMSE 2 months             Individualized Plan for Diabetes Self-Management Training:   Learning Objective:  Patient will have a greater understanding of diabetes self-management. Patient education plan is to attend individual and/or group sessions per assessed needs and concerns.   Plan:   Patient Instructions  Try [place] sausage Vilma Meckel) as a protein for breakfast along with oatmeal or bread. Limit to 1-2 pieces to keep sodium in control. Eat a protein food + a small amount of a starchy food + a vegetable or fruit with each meal, 3 times a day.  Eat something every 3-5 hours during the day.  If awake for more than 3 hours after dinner, eat a small snack with protein, like yogurt, or fruit and a few nuts, or a protein drink. Do some light exercise for 10 minutes or longer most days, or walk for a few minutes after each meal. Make sure to eat 1 hour before exercise so blood sugar does not drop too low. If it has been more than 2 hours after a meal, then eat a snack of fruit and a piece of cheese or a few nuts.    Expected Outcomes:  Demonstrated interest in learning. Expect positive outcomes  Education material provided: Diabetes and You booklet ([employer]ovo); plate planner with food lists, sample menus, snacking handout  If problems or questions, patient to contact team via:  Phone  Future DSME appointment: 2 months

## 2023-06-26 ENCOUNTER — Other Ambulatory Visit: Payer: Self-pay

## 2023-06-26 ENCOUNTER — Ambulatory Visit
Admission: EM | Admit: 2023-06-26 | Discharge: 2023-06-26 | Disposition: A | Discharge status: Home / Self Care | Attending: Emergency Medicine | Admitting: Emergency Medicine

## 2023-06-26 ENCOUNTER — Encounter: Payer: Self-pay | Admitting: Emergency Medicine

## 2023-06-26 DIAGNOSIS — N898 Other specified noninflammatory disorders of vagina: Secondary | ICD-10-CM | POA: Insufficient documentation

## 2023-06-26 MED ORDER — MICONAZOLE NITRATE 2 % EX CREA: 1.0000 | TOPICAL_CREAM | Freq: Two times a day (BID) | CUTANEOUS | 0 refills | Status: AC

## 2023-06-26 NOTE — Discharge Instructions (Addendum)
 Today you are being treated prophylactically for yeast.   Take diflucan 150 mg once, if symptoms still present in 3 days then you may take second pill   May apply vaginal cream twice daily for additional comfort  Yeast infections which are caused by a naturally occurring fungus called candida. Vaginosis is an inflammation of the vagina that can result in discharge, itching and pain. The cause is usually a change in the normal balance of vaginal bacteria or an infection. Vaginosis can also result from reduced estrogen levels after menopause.  Labs pending 2-3 days, you will be contacted if positive  In addition:   Avoid baths, hot tubs and whirlpool spas.  Don't use scented or harsh soaps, such as those with deodorant or antibacterial action. Avoid irritants. These include scented tampons and pads. Wipe from front to back after using the toilet.  Don't douche. Your vagina doesn't require cleansing other than normal bathing.  Use a  condom. Wear cotton underwear, this fabric helps absorb moisture

## 2023-06-26 NOTE — ED Triage Notes (Signed)
 Patient presents to [facility] for evaluation of 3 days of vaginal itching.  Denies changes in urination, denies discharge.  Had some fluconazole at home from "years ago" and took one, no relief.

## 2023-06-26 NOTE — ED Provider Notes (Signed)
 Renaldo Fiddler    CSN: [id number] Arrival date & time: 06/26/23  1226      History   Chief Complaint Chief Complaint  Patient presents with   Vaginal Itching    HPI Felicia Roberts is a 83 y.o. female.   Patient presents for evaluation of vaginal itching present for 3 days.  Has not attempted treatment but does have a old prescription of fluconazole available.  Denies vaginal discharge, odor, abdominal or flank pain, fever or urinary symptoms.  Past Medical History:  Diagnosis Date   Asthma    Chicken pox    Chronic kidney disease    Colon polyps    Diabetes mellitus without complication (HCC)    Diverticulosis    GERD (gastroesophageal reflux disease)    Hyperlipidemia    Hypertension     Patient Active Problem List   Diagnosis Date Noted   Hypertensive urgency 09/09/2022   Type II diabetes mellitus with renal manifestations (HCC) 09/09/2022   Hypothyroidism 09/09/2022   Chronic kidney disease, stage 3a (HCC) 09/09/2022   Headache 09/09/2022   Numbness 11/11/2020   Osteopenia 01/20/2020   Perimenopausal atrophic vaginitis 04/24/2018   Pure hypercholesterolemia 04/24/2018   Atypical chest pain 10/18/2017   Excessive cerumen in both ear canals 10/18/2017   Tinnitus of both ears 07/19/2017   History of colon polyps 01/18/2017   Epigastric pain 04/06/2016   CKD (chronic kidney disease) stage 3, GFR 30-59 ml/min (HCC) 06/20/2015   Constipation 06/20/2015   Essential hypertension 06/19/2015   Hyperlipidemia 04/18/2015   DM (diabetes mellitus), type 2, uncontrolled, with renal complications 04/18/2015   Asthma 04/18/2015    Past Surgical History:  Procedure Laterality Date   ARTERY BIOPSY Left 03/15/2021   Procedure: BIOPSY TEMPORAL ARTERY;  Surgeon: Carolan Shiver, MD;  Location: [facility] ORS;  Service: General;  Laterality: Left;   BLADDER SURGERY     COLONOSCOPY WITH PROPOFOL N/A 09/25/2017   Procedure: COLONOSCOPY WITH PROPOFOL;  Surgeon:  Toledo, Boykin Nearing, MD;  Location: [facility] ENDOSCOPY;  Service: Gastroenterology;  Laterality: N/A;   ESOPHAGOGASTRODUODENOSCOPY N/A 12/30/2020   Procedure: ESOPHAGOGASTRODUODENOSCOPY (EGD);  Surgeon: Jaynie Collins, DO;  Location: [facility] ENDOSCOPY;  Service: Gastroenterology;  Laterality: N/A;   ESOPHAGOGASTRODUODENOSCOPY (EGD) WITH PROPOFOL N/A 09/25/2017   Procedure: ESOPHAGOGASTRODUODENOSCOPY (EGD) WITH PROPOFOL;  Surgeon: Toledo, Boykin Nearing, MD;  Location: [facility] ENDOSCOPY;  Service: Gastroenterology;  Laterality: N/A;   EYE SURGERY     URETHRAL DIVERTICULUM REPAIR  1980    OB History     Gravida  1   Para  1   Term      Preterm      AB      Living  1      SAB      IAB      Ectopic      Multiple      Live Births           Obstetric Comments  1st Menstrual Cycle: 13 1st Pregnancy:  20          Home Medications    Prior to Admission medications   Medication Sig Start Date End Date Taking? Authorizing Provider  miconazole (MICOTIN) 2 % cream Apply 1 Application topically 2 (two) times daily. 06/26/23  Yes Luwanna Brossman R, NP  albuterol (PROVENTIL HFA;VENTOLIN HFA) 108 (90 Base) MCG/ACT inhaler INHALE ONE TO TWO PUFFS BY MOUTH EVERY 6 HOURS AS NEEDED FOR WHEEZING OR  SHORTNESS  OF  BREATH 12/19/17   Birdie Sons,  Yehuda Mao, MD  amLODipine (NORVASC) 5 MG tablet Take 2 tablets (10 mg total) by mouth daily. 09/10/22   Marcelino Duster, MD  Ascorbic Acid (VITAMIN C GUMMIE PO) Take by mouth.    [provider]  atorvastatin (LIPITOR) 40 MG tablet Take 1 tablet (40 mg total) by mouth daily. Patient taking differently: Take 20 mg by mouth daily. 09/04/17   Glori Luis, MD  B Complex Vitamins (VITAMIN-B COMPLEX PO) Take by mouth daily.    [provider]  glipiZIDE (GLUCOTROL) 5 MG tablet Take 5 mg by mouth daily before breakfast. Glipizide XL    [provider]  hydroxypropyl methylcellulose / hypromellose (ISOPTO TEARS / GONIOVISC) 2.5  % ophthalmic solution Place 1 drop into both eyes 4 (four) times daily as needed for dry eyes.    [provider]  levothyroxine (SYNTHROID) 50 MCG tablet Take 1 tablet (50 mcg total) by mouth daily before breakfast. 09/10/22   Marcelino Duster, MD  lisinopril (ZESTRIL) 20 MG tablet Take 1 tablet (20 mg total) by mouth daily. 09/10/22   Marcelino Duster, MD  pantoprazole (PROTONIX) 20 MG tablet Take 20 mg by mouth daily.    [provider]  pioglitazone (ACTOS) 30 MG tablet Take 30 mg by mouth daily.    [provider]  VITAMIN D PO Take by mouth daily.    [provider]    Family History Family History  Problem Relation Age of Onset   Cancer Father        prostate   Diabetes Sister    Hypertension Sister    AAA (abdominal aortic aneurysm) Brother    Diabetes Sister    Hypertension Sister    Diabetes Sister    Hypertension Sister    Diabetes Sister    Hypertension Sister    Hypertension Mother    Heart disease Mother    Diabetes Mother    Breast cancer Neg Hx     Social History Social History   Tobacco Use   Smoking status: Never   Smokeless tobacco: Never  Vaping Use   Vaping status: Never Used  Substance Use Topics   Alcohol use: No   Drug use: Never     Allergies   Farxiga [dapagliflozin], Toviaz [fesoterodine fumarate er], and Januvia [sitagliptin]   Review of Systems Review of Systems   Physical Exam Triage Vital Signs ED Triage Vitals  Encounter Vitals Group     BP 06/26/23 1237 (!) 161/97     Systolic BP Percentile --      Diastolic BP Percentile --      Pulse Rate 06/26/23 1237 91     Resp 06/26/23 1237 18     Temp 06/26/23 1237 98.9 F (37.2 C)     Temp Source 06/26/23 1237 Temporal     SpO2 06/26/23 1237 95 %     Weight --      Height --      Head Circumference --      Peak Flow --      Pain Score 06/26/23 1238 0     Pain Loc --      Pain Education --      Exclude from Growth Chart --    No  data found.  Updated Vital Signs BP (!) 161/97 (BP Location: Left Arm)   Pulse 91   Temp 98.9 F (37.2 C) (Temporal)   Resp 18   SpO2 95%   Visual Acuity Right Eye Distance:  Left Eye Distance:   Bilateral Distance:    Right Eye Near:   Left Eye Near:    Bilateral Near:     Physical Exam Constitutional:      Appearance: Normal appearance.  Eyes:     Extraocular Movements: Extraocular movements intact.  Pulmonary:     Effort: Pulmonary effort is normal.  Genitourinary:    Comments: deferred Neurological:     Mental Status: She is alert and oriented to person, place, and time. Mental status is at baseline.      UC Treatments / Results  Labs (all labs ordered are listed, but only abnormal results are displayed) Labs Reviewed  CERVICOVAGINAL ANCILLARY ONLY    EKG   Radiology No results found.  Procedures Procedures (including critical care time)  Medications Ordered in UC Medications - No data to display  Initial Impression / Assessment and Plan / UC Course  I have reviewed the triage vital signs and the nursing notes.  Pertinent labs & imaging results that were available during my care of the patient were reviewed by me and considered in my medical decision making (see chart for details).  Vaginal itching  Prophylactically treating for yeast, recommended patient initiating Diflucan tablets, prescribed topical external vaginal cream, swab checking for yeast and BV pending, most likely related to uncontrolled diabetes per chart review, denies sexual activity therefore further STI testing deferred, recommended supportive care with follow-up as needed  Final diagnoses:  Vaginal itching     Discharge Instructions      Today you are being treated prophylactically for yeast.   Take diflucan 150 mg once, if symptoms still present in 3 days then you may take second pill   May apply vaginal cream twice daily for additional comfort  Yeast infections  which are caused by a naturally occurring fungus called candida. Vaginosis is an inflammation of the vagina that can result in discharge, itching and pain. The cause is usually a change in the normal balance of vaginal bacteria or an infection. Vaginosis can also result from reduced estrogen levels after menopause.  Labs pending 2-3 days, you will be contacted if positive  In addition:   Avoid baths, hot tubs and whirlpool spas.  Don't use scented or harsh soaps, such as those with deodorant or antibacterial action. Avoid irritants. These include scented tampons and pads. Wipe from front to back after using the toilet.  Don't douche. Your vagina doesn't require cleansing other than normal bathing.  Use a  condom. Wear cotton underwear, this fabric helps absorb moisture     ED Prescriptions     Medication Sig Dispense Auth. Provider   miconazole (MICOTIN) 2 % cream Apply 1 Application topically 2 (two) times daily. 28.35 g Valinda Hoar, NP      PDMP not reviewed this encounter.   Valinda Hoar, NP 06/26/23 1256

## 2023-06-27 LAB — CERVICOVAGINAL ANCILLARY ONLY
Bacterial Vaginitis (gardnerella): NEGATIVE
Candida Glabrata: NEGATIVE
Candida Vaginitis: POSITIVE — AB
Comment: NEGATIVE
Comment: NEGATIVE
Comment: NEGATIVE

## 2023-07-31 ENCOUNTER — Ambulatory Visit: Admitting: Dietician

## 2023-09-04 ENCOUNTER — Ambulatory Visit: Admitting: Dietician

## 2023-09-17 ENCOUNTER — Other Ambulatory Visit
Admission: RE | Admit: 2023-09-17 | Discharge: 2023-09-17 | Disposition: A | Discharge status: Home / Self Care | Source: Ambulatory Visit | Attending: Physician Assistant | Admitting: Physician Assistant

## 2023-09-17 DIAGNOSIS — R079 Chest pain, unspecified: Secondary | ICD-10-CM | POA: Insufficient documentation

## 2023-09-17 LAB — TROPONIN I (HIGH SENSITIVITY): Troponin I (High Sensitivity): 5 ng/L (ref <18)

## 2023-10-07 ENCOUNTER — Ambulatory Visit: Admitting: Dietician

## 2023-10-21 ENCOUNTER — Encounter: Payer: Self-pay | Admitting: Dietician

## 2023-10-21 ENCOUNTER — Encounter: Attending: Endocrinology | Admitting: Dietician

## 2023-10-21 VITALS — Wt 117.3 lb

## 2023-10-21 DIAGNOSIS — N1831 Chronic kidney disease, stage 3a: Secondary | ICD-10-CM

## 2023-10-21 DIAGNOSIS — E1165 Type 2 diabetes mellitus with hyperglycemia: Secondary | ICD-10-CM | POA: Diagnosis present

## 2023-10-21 DIAGNOSIS — Z713 Dietary counseling and surveillance: Secondary | ICD-10-CM | POA: Insufficient documentation

## 2023-10-21 NOTE — Progress Notes (Signed)
 Diabetes Self-Management Education  Visit Type:  Follow-up  Appt. Start Time: 1110  Appt. End Time: 1200  10/21/2023  [name], identified by name and date of birth, is a 83 y.o. female with a diagnosis of Diabetes:  .   ASSESSMENT  Weight 117 lb 4.8 oz (53.2 kg). Body mass index is 22.91 kg/m.    Diabetes Self-Management Education - 10/21/23 1123       Dietary Intake   Breakfast usually skips due to lack of hunger; occ 10:30-11am oatmeal; boiled egg    Lunch cod with onion with plaintain    Dinner often skips, not hungry      Patient Education   Healthy Eating Role of diet in the treatment of diabetes and the relationship between the three main macronutrients and blood glucose level;Food label reading, portion sizes and measuring food.;Plate Method;Meal timing in regards to the patients' current diabetes medication.;Meal options for control of blood glucose level and chronic complications.;Other (comment)   adequate nutrition with poor appetite/ lack of hunger   Being Active Role of exercise on diabetes management, blood pressure control and cardiac health.      Individualized Goals (developed by patient)   Nutrition Other (comment)   eat a meal or snack every 3-5 hours; include protein + one or more other food groups with each meal or snack   Physical Activity Exercise 3-5 times per week;15 minutes per day          Learning Objective:  Patient will have a greater understanding of diabetes self-management. Patient education plan is to attend individual and/or group sessions per assessed needs and concerns.   Plan:   Patient Instructions  Eat a small meal or snack every 3-4 hours during the day. For example:  10:30am oatmeal with a few nuts and blueberries or sliced peach; or yogurt and fruit; or egg and toast and fruit 2pm salad with meat and cheese and crackers 6pm chicken, 1/3 - 2/3 cup rice, broccoli 9pm fruit and yogurt or crackers and cheese  Talk to  pharmacist or doctor about whether miralax  is ok to take with kidney disease. Or if there is something better for constipation. Keep drinking plenty of water, but try stopping for 15 - 30 minutes before eating a meal or snack to make more room for food.   Expected Outcomes:  Demonstrated interest in learning. Expect positive outcomes  Education material provided: plate planner with food lists; General meal planning guidelines for Diabetes; sample menus; snacking handout  If problems or questions, patient to contact team via:  Phone  Future DSME appointment: - 11/27/23

## 2023-10-21 NOTE — Patient Instructions (Signed)
 Eat a small meal or snack every 3-4 hours during the day. For example:  10:30am oatmeal with a few nuts and blueberries or sliced peach; or yogurt and fruit; or egg and toast and fruit 2pm salad with meat and cheese and crackers 6pm chicken, 1/3 - 2/3 cup rice, broccoli 9pm fruit and yogurt or crackers and cheese  Talk to pharmacist or doctor about whether miralax  is ok to take with kidney disease. Or if there is something better for constipation. Keep drinking plenty of water, but try stopping for 15 - 30 minutes before eating a meal or snack to make more room for food.

## 2023-11-27 ENCOUNTER — Encounter: Attending: Endocrinology | Admitting: Dietician

## 2023-11-27 ENCOUNTER — Encounter: Payer: Self-pay | Admitting: Dietician

## 2023-11-27 VITALS — Wt 118.0 lb

## 2023-11-27 DIAGNOSIS — E1122 Type 2 diabetes mellitus with diabetic chronic kidney disease: Secondary | ICD-10-CM | POA: Insufficient documentation

## 2023-11-27 DIAGNOSIS — N1831 Chronic kidney disease, stage 3a: Secondary | ICD-10-CM | POA: Insufficient documentation

## 2023-11-27 NOTE — Progress Notes (Signed)
 Diabetes Self-Management Education  Visit Type:  Follow-up  Appt. Start Time: 1000 Appt. End Time: 1030  11/27/2023  Felicia Roberts, identified by name and date of birth, is a 83 y.o. female with a diagnosis of Diabetes:  .   ASSESSMENT  Weight 118 lb (53.5 kg). Body mass index is 23.05 kg/m.    Diabetes Self-Management Education - 11/27/23 1218       Psychosocial Assessment   Learning Readiness Change in progress      Complications   How often do you check your blood sugar? 0 times/day (not testing)    Number of hyperglycemic episodes ( >200mg /dL): Daily    Can you tell when your blood sugar is high? No      Dietary Intake   Breakfast skips or eats 1pc toast or oatmeal, drinks coffee    Snack (morning) none or fruit    Lunch sandwich with tuna; 1/2 boiled plantain with egg    Snack (afternoon) none or fruit: orange/ grapes/ plum    Dinner sometimes skips; sometimes air-fried chicken with salad and/or small portion rice    Snack (evening) none    Beverage(s) water, coffee in am      Activity / Exercise   Activity / Exercise Type ADL's   limited activity due to recent respiratory illness     Patient Education   Healthy Eating Plate Method;Meal timing in regards to the patients' current diabetes medication.;Meal options for control of blood glucose level and chronic complications.   wrote personalized sample menu   Being Active Other (comment)   advised limited exercise until next medical visit; due to high HbA1C   Medications Reviewed patients medication for diabetes, action, purpose, timing of dose and side effects.    Monitoring Taught/discussed recording of test results and interpretation of SMBG.      Post-Education Assessment   Patient understands the diabetes disease and treatment process. Comprehends key points    Patient understands incorporating nutritional management into lifestyle. Comprehends key points    Patient undertands incorporating physical  activity into lifestyle. Comprehends key points    Patient understands using medications safely. Demonstrates understanding / competency    Patient understands monitoring blood glucose, interpreting and using results Comprehends key points    Patient understands prevention, detection, and treatment of acute complications. Comprehends key points    Patient understands prevention, detection, and treatment of chronic complications. Needs Review    Patient understands how to develop strategies to address psychosocial issues. Comprehends key points    Patient understands how to develop strategies to promote health/change behavior. Needs Review      Outcomes   Program Status Completed      Subsequent Visit   Since your last visit have you continued or begun to take your medications as prescribed? Yes    Since your last visit have you experienced any weight changes? No change    Since your last visit, are you checking your blood glucose at least once a day? No          Learning Objective:  Patient will have a greater understanding of diabetes self-management. Patient education plan is to attend individual and/or group sessions per assessed needs and concerns.   Plan:   Patient Instructions  Make sure to eat something every 3-4 hours during the day Take Glipizide about 30 minutes before breakfast and before supper. Try Glucerna or other protein drink -- drink 1/2 for a snack 1 or 2 times a day. Start  checking blood sugar every day and bring results to doctor.     Expected Outcomes:  Demonstrated interest in learning. Expect positive outcomes  Education material provided: plate planner with food lists, personalized sample menu  If problems or questions, patient to contact team via:  Phone  Future DSME appointment: - PRN; RD will plan to contact patient by phone 12/2023

## 2023-11-27 NOTE — Patient Instructions (Signed)
 Make sure to eat something every 3-4 hours during the day Take Glipizide about 30 minutes before breakfast and before supper. Try Glucerna or other protein drink -- drink 1/2 for a snack 1 or 2 times a day. Start checking blood sugar every day and bring results to doctor.

## 2023-12-19 ENCOUNTER — Emergency Department

## 2023-12-19 ENCOUNTER — Emergency Department
Admission: EM | Admit: 2023-12-19 | Discharge: 2023-12-19 | Disposition: A | Discharge status: Home / Self Care | Attending: Emergency Medicine | Admitting: Emergency Medicine

## 2023-12-19 ENCOUNTER — Other Ambulatory Visit: Payer: Self-pay

## 2023-12-19 DIAGNOSIS — Z79899 Other long term (current) drug therapy: Secondary | ICD-10-CM | POA: Insufficient documentation

## 2023-12-19 DIAGNOSIS — R439 Unspecified disturbances of smell and taste: Secondary | ICD-10-CM | POA: Diagnosis not present

## 2023-12-19 DIAGNOSIS — R519 Headache, unspecified: Secondary | ICD-10-CM | POA: Diagnosis present

## 2023-12-19 DIAGNOSIS — R531 Weakness: Secondary | ICD-10-CM | POA: Diagnosis not present

## 2023-12-19 DIAGNOSIS — E039 Hypothyroidism, unspecified: Secondary | ICD-10-CM | POA: Insufficient documentation

## 2023-12-19 DIAGNOSIS — N189 Chronic kidney disease, unspecified: Secondary | ICD-10-CM | POA: Diagnosis not present

## 2023-12-19 DIAGNOSIS — R432 Parageusia: Secondary | ICD-10-CM

## 2023-12-19 DIAGNOSIS — I129 Hypertensive chronic kidney disease with stage 1 through stage 4 chronic kidney disease, or unspecified chronic kidney disease: Secondary | ICD-10-CM | POA: Diagnosis not present

## 2023-12-19 DIAGNOSIS — E1122 Type 2 diabetes mellitus with diabetic chronic kidney disease: Secondary | ICD-10-CM | POA: Diagnosis not present

## 2023-12-19 DIAGNOSIS — I10 Essential (primary) hypertension: Secondary | ICD-10-CM

## 2023-12-19 LAB — CBC
HCT: 39.8 % (ref 36.0–46.0)
Hemoglobin: 13.2 g/dL (ref 12.0–15.0)
MCH: 30.4 pg (ref 26.0–34.0)
MCHC: 33.2 g/dL (ref 30.0–36.0)
MCV: 91.7 fL (ref 80.0–100.0)
Platelets: 363 K/uL (ref 150–400)
RBC: 4.34 MIL/uL (ref 3.87–5.11)
RDW: 12.9 % (ref 11.5–15.5)
WBC: 6.3 K/uL (ref 4.0–10.5)
nRBC: 0 % (ref 0.0–0.2)

## 2023-12-19 LAB — BASIC METABOLIC PANEL WITH GFR
Anion gap: 16 — ABNORMAL HIGH (ref 5–15)
BUN: 35 mg/dL — ABNORMAL HIGH (ref 8–23)
CO2: 19 mmol/L — ABNORMAL LOW (ref 22–32)
Calcium: 10 mg/dL (ref 8.9–10.3)
Chloride: 104 mmol/L (ref 98–111)
Creatinine, Ser: 1.52 mg/dL — ABNORMAL HIGH (ref 0.44–1.00)
GFR, Estimated: 34 mL/min — ABNORMAL LOW (ref >60)
Glucose, Bld: 242 mg/dL — ABNORMAL HIGH (ref 70–99)
Potassium: 4.2 mmol/L (ref 3.5–5.1)
Sodium: 139 mmol/L (ref 135–145)

## 2023-12-19 LAB — RESP PANEL BY RT-PCR (RSV, FLU A&B, COVID)  RVPGX2
Influenza A by PCR: NEGATIVE
Influenza B by PCR: NEGATIVE
Resp Syncytial Virus by PCR: NEGATIVE
SARS Coronavirus 2 by RT PCR: NEGATIVE

## 2023-12-19 MED ORDER — LACTATED RINGERS IV BOLUS
1000.0000 mL | Freq: Once | INTRAVENOUS | Status: AC
  Administered 2023-12-19: 1000 mL via INTRAVENOUS

## 2023-12-19 MED ORDER — ACETAMINOPHEN 500 MG PO TABS
1000.0000 mg | ORAL_TABLET | Freq: Once | ORAL | Status: AC
  Administered 2023-12-19: 1000 mg via ORAL
  Filled 2023-12-19: qty 2

## 2023-12-19 MED ORDER — LOSARTAN POTASSIUM 50 MG PO TABS: 50.0000 mg | ORAL_TABLET | Freq: Two times a day (BID) | ORAL | 0 refills | Status: AC

## 2023-12-19 NOTE — ED Triage Notes (Signed)
 First Nurse Note: Patient to ED via [facility] for weakness, fatigue, headache, dysphagia. Ongoing x1 week. Recent changes in BP.

## 2023-12-19 NOTE — Progress Notes (Signed)
 FATIGUE, DIZZINESS AND DIFFICULTY SWALLOWING. HEADACHE  NO SORE THROAT. RECOMMEND ED EVAL POSS CVA

## 2023-12-19 NOTE — ED Triage Notes (Signed)
 Pt reports headache, fatigue, loss of taste x1 week. +cough

## 2023-12-19 NOTE — Discharge Instructions (Addendum)
 Please begin taking your losartan  100 as a 50 mg dose twice a day. The new dose of this medication will be losartan  50 mg twice a day

## 2023-12-19 NOTE — ED Provider Notes (Signed)
 [facility] Provider Note   Event Date/Time   First MD Initiated Contact with Patient 12/19/23 1458     (approximate) History  Headache  HPI Felicia Roberts is a 83 y.o. female with a past medical history of hypertension, hyperlipidemia, type 2 diabetes, CKD, and hypothyroidism who presents complaining of of throbbing occipital headache, generalized weakness, and loss of taste for the last 1 week with associated nonproductive cough.  Patient states she has also had increasing blood pressure over this time with systolics of 180 at home when she has been checking it.  Patient endorses increase of her losartan  to 100 mg daily recently and states that she has been compliant.  Patient denies any recent travel, sick contacts, or food out of the ordinary. ROS: Patient currently denies any fever, vision changes, tinnitus, difficulty speaking, facial droop, sore throat, chest pain, shortness of breath, abdominal pain, nausea/vomiting/diarrhea, dysuria, or numbness/paresthesias in any extremity   Physical Exam  Triage Vital Signs: ED Triage Vitals [12/19/23 1239]  Encounter Vitals Group     BP (!) 147/89     Girls Systolic BP Percentile      Girls Diastolic BP Percentile      Boys Systolic BP Percentile      Boys Diastolic BP Percentile      Pulse Rate 85     Resp 20     Temp 98.3 F (36.8 C)     Temp src      SpO2 93 %     Weight 117 lb (53.1 kg)     Height 5' 5 (1.651 m)     Head Circumference      Peak Flow      Pain Score 5     Pain Loc      Pain Education      Exclude from Growth Chart    Most recent vital signs: Vitals:   12/19/23 1239  BP: (!) 147/89  Pulse: 85  Resp: 20  Temp: 98.3 F (36.8 C)  SpO2: 93%   General: Awake, oriented x4. CV:  Good peripheral perfusion. Resp:  Normal effort. Abd:  No distention. Other:  Elderly well-developed, well-nourished Hispanic female resting comfortably in no acute distress.  Stable ambulation NIHSS  0 ED Results / Procedures / Treatments  Labs (all labs ordered are listed, but only abnormal results are displayed) Labs Reviewed  BASIC METABOLIC PANEL WITH GFR - Abnormal; Notable for the following components:      Result Value   CO2 19 (*)    Glucose, Bld 242 (*)    BUN 35 (*)    Creatinine, Ser 1.52 (*)    GFR, Estimated 34 (*)    Anion gap 16 (*)    All other components within normal limits  RESP PANEL BY RT-PCR (RSV, FLU A&B, COVID)  [id number]  CBC   RADIOLOGY ED MD interpretation: 2 view chest x-ray shows no evidence of acute abnormalities - All radiology independently interpreted and agree with radiology assessment Official radiology report(s): CT Head Wo Contrast Result Date: 12/19/2023 EXAM: CT HEAD WITHOUT CONTRAST 12/19/2023 03:48:31 PM TECHNIQUE: CT of the head was performed without the administration of intravenous contrast. Automated exposure control, iterative reconstruction, and/or weight based adjustment of the mA/kV was utilized to reduce the radiation dose to as low as reasonably achievable. COMPARISON: CT head without contrast 09/09/2022. CLINICAL HISTORY: Mental status change, unknown cause. Pt reports headache, fatigue, loss of taste x1 week. +cough FINDINGS: BRAIN AND VENTRICLES: No  acute hemorrhage. No evidence of acute infarct. No hydrocephalus. No extra-axial collection. No mass effect or midline shift. Mild atrophy and white matter changes are stable. Atherosclerotic calcifications are present in the cavernous carotid arteries bilaterally and at the dural margin of both vertebral arteries. No hyperdense vessel is present. ORBITS: No acute abnormality. SINUSES: Scattered ethmoid air cell opacification is present. Mild mucosal thickening is present in the sphenoid sinuses and visualized maxillary sinuses bilaterally. SOFT TISSUES AND SKULL: No acute soft tissue abnormality. No skull fracture. IMPRESSION: 1. No acute intracranial abnormality. 2. Stable mild atrophy and  white matter changes. 3. Atherosclerotic calcifications in the cavernous carotid arteries bilaterally and at the dural margin of both vertebral arteries. No hyperdense vessel. Electronically signed by: Lonni Necessary MD 12/19/2023 04:21 PM EDT RP Workstation: [id number]   DG Chest 2 View Result Date: 12/19/2023 EXAM: 2 VIEW(S) XRAY OF THE CHEST 12/19/2023 01:02:00 PM COMPARISON: None available. CLINICAL HISTORY: cough. Patient to ED via [facility] for weakness, fatigue, headache, dysphagia. Ongoing x1 week. Recent changes in BP. FINDINGS: LUNGS AND PLEURA: No focal pulmonary opacity. No pulmonary edema. No pleural effusion. No pneumothorax. HEART AND MEDIASTINUM: Aortic atherosclerosis. No acute abnormality of the cardiac and mediastinal silhouettes. BONES AND SOFT TISSUES: No acute osseous abnormality. IMPRESSION: 1. No acute findings. Electronically signed by: Norman Gatlin MD 12/19/2023 01:16 PM EDT RP Workstation: [id number]   PROCEDURES: Critical Care performed: No Procedures MEDICATIONS ORDERED IN ED: Medications  lactated ringers  bolus 1,000 mL (1,000 mLs Intravenous New Bag/Given 12/19/23 1421)  acetaminophen  (TYLENOL ) tablet 1,000 mg (1,000 mg Oral Given 12/19/23 1417)   IMPRESSION / MDM / ASSESSMENT AND PLAN / ED COURSE  I reviewed the triage vital signs and the nursing notes.                             The patient is on the cardiac monitor to evaluate for evidence of arrhythmia and/or significant heart rate changes. Patient's presentation is most consistent with acute presentation with potential threat to life or bodily function. Patient is an 83 year old female who presents complaining of occipital throbbing headache, generalized weakness, loss of taste, nonproductive cough over the last week. Based on patient's physical exam with no focal neurologic deficits, I have low suspicion for patient's symptoms being ICH, TIA, CVA, intracranial mass.  Patient has slightly worsened creatinine  from baseline from 1.1-1.5 to likely reflecting patient's decreased p.o. intake.  Patient has tested negative for COVID/flu/RSV.  CBC shows no evidence of acute abnormalities.  Patient may still have an element of viral infection however patient is afebrile here.  There is also an element of patient's blood pressure likely relating to patient's occipital throbbing headache with systolics in the 180s.  Patient states that she takes her blood pressure in the mornings and usually finds it to be high however later in the day it is back to normal.  Recommended patient take her losartan  50 mg twice daily instead of 100 daily.  Patient hemodynamically stable throughout emergency department course.  Patient is stable for discharge home and PCP follow-up.  Patient agrees with plan and all questions were answered.  Patient given strict return precautions prior to discharge  Dispo: Discharge home with PCP follow-up   FINAL CLINICAL IMPRESSION(S) / ED DIAGNOSES   Final diagnoses:  Occipital headache  Generalized weakness  Loss of taste  Hypertension, unspecified type   Rx / DC Orders   ED Discharge Orders  Ordered    losartan  (COZAAR ) 50 MG tablet  2 times daily        12/19/23 1636           Note:  This document was prepared using Dragon voice recognition software and may include unintentional dictation errors.   Inmer Nix K, MD 12/19/23 [phone]

## 2024-04-01 ENCOUNTER — Emergency Department: Admission: EM | Admit: 2024-04-01 | Discharge: 2024-04-01 | Disposition: A | Discharge status: Home / Self Care

## 2024-04-01 ENCOUNTER — Emergency Department

## 2024-04-01 ENCOUNTER — Other Ambulatory Visit: Payer: Self-pay

## 2024-04-01 ENCOUNTER — Encounter: Payer: Self-pay | Admitting: Emergency Medicine

## 2024-04-01 DIAGNOSIS — N189 Chronic kidney disease, unspecified: Secondary | ICD-10-CM | POA: Insufficient documentation

## 2024-04-01 DIAGNOSIS — E1165 Type 2 diabetes mellitus with hyperglycemia: Secondary | ICD-10-CM | POA: Insufficient documentation

## 2024-04-01 DIAGNOSIS — R0602 Shortness of breath: Secondary | ICD-10-CM | POA: Insufficient documentation

## 2024-04-01 DIAGNOSIS — R739 Hyperglycemia, unspecified: Secondary | ICD-10-CM

## 2024-04-01 DIAGNOSIS — I129 Hypertensive chronic kidney disease with stage 1 through stage 4 chronic kidney disease, or unspecified chronic kidney disease: Secondary | ICD-10-CM | POA: Diagnosis not present

## 2024-04-01 LAB — BASIC METABOLIC PANEL WITH GFR
Anion gap: 15 (ref 5–15)
BUN: 40 mg/dL — ABNORMAL HIGH (ref 8–23)
CO2: 22 mmol/L (ref 22–32)
Calcium: 9.8 mg/dL (ref 8.9–10.3)
Chloride: 95 mmol/L — ABNORMAL LOW (ref 98–111)
Creatinine, Ser: 1.61 mg/dL — ABNORMAL HIGH (ref 0.44–1.00)
GFR, Estimated: 31 mL/min — ABNORMAL LOW
Glucose, Bld: 500 mg/dL — ABNORMAL HIGH (ref 70–99)
Potassium: 4 mmol/L (ref 3.5–5.1)
Sodium: 131 mmol/L — ABNORMAL LOW (ref 135–145)

## 2024-04-01 LAB — CBC
HCT: 38.2 % (ref 36.0–46.0)
Hemoglobin: 13 g/dL (ref 12.0–15.0)
MCH: 30.2 pg (ref 26.0–34.0)
MCHC: 34 g/dL (ref 30.0–36.0)
MCV: 88.6 fL (ref 80.0–100.0)
Platelets: 357 K/uL (ref 150–400)
RBC: 4.31 MIL/uL (ref 3.87–5.11)
RDW: 12.2 % (ref 11.5–15.5)
WBC: 8 K/uL (ref 4.0–10.5)
nRBC: 0 % (ref 0.0–0.2)

## 2024-04-01 LAB — CBG MONITORING, ED
Glucose-Capillary: 459 mg/dL — ABNORMAL HIGH (ref 70–99)
Glucose-Capillary: 484 mg/dL — ABNORMAL HIGH (ref 70–99)
Glucose-Capillary: 450 mg/dL — ABNORMAL HIGH (ref 70–99)
Glucose-Capillary: 395 mg/dL — ABNORMAL HIGH (ref 70–99)
Glucose-Capillary: 309 mg/dL — ABNORMAL HIGH (ref 70–99)

## 2024-04-01 MED ORDER — LACTATED RINGERS IV BOLUS
1000.0000 mL | Freq: Once | INTRAVENOUS | Status: AC
  Administered 2024-04-01: 1000 mL via INTRAVENOUS

## 2024-04-01 MED ORDER — INSULIN ASPART 100 UNIT/ML IJ SOLN
10.0000 [IU] | Freq: Once | INTRAMUSCULAR | Status: AC
  Administered 2024-04-01: 10 [IU] via SUBCUTANEOUS
  Filled 2024-04-01: qty 10

## 2024-04-01 MED ORDER — SODIUM CHLORIDE 0.9 % IV BOLUS
500.0000 mL | Freq: Once | INTRAVENOUS | Status: AC
  Administered 2024-04-01: 500 mL via INTRAVENOUS

## 2024-04-01 MED ORDER — IPRATROPIUM-ALBUTEROL 0.5-2.5 (3) MG/3ML IN SOLN
3.0000 mL | Freq: Once | RESPIRATORY_TRACT | Status: AC
  Administered 2024-04-01: 3 mL via RESPIRATORY_TRACT
  Filled 2024-04-01: qty 3

## 2024-04-01 NOTE — ED Provider Notes (Signed)
 "  [facility] Provider Note    Event Date/Time   First MD Initiated Contact with Patient 04/01/24 1212     (approximate)   History   Hyperglycemia   HPI  Arbell Wycoff is a 84 y.o. female with a past medical history of diabetes, CKD, hypertension, presenting to the emergency department complaining of hyperglycemia and shortness of breath which has been ongoing for the last 2 days.  The patient reports that her home health nurse today checked her sugar and stated that it was over 500.  She also felt like the patient was having difficulty breathing and so advised her to come to the ER for evaluation.  Patient reports that she has been using her inhaler at home but that she feels as if her breathing is heavy and the inhaler is not helping.  She reports that she takes pills for her diabetes but does not take any insulin  shots.     Physical Exam   Triage Vital Signs: ED Triage Vitals  Encounter Vitals Group     BP 04/01/24 1116 (!) 147/76     Girls Systolic BP Percentile --      Girls Diastolic BP Percentile --      Boys Systolic BP Percentile --      Boys Diastolic BP Percentile --      Pulse Rate 04/01/24 1116 68     Resp 04/01/24 1116 18     Temp 04/01/24 1116 97.9 F (36.6 C)     Temp Source 04/01/24 1116 Oral     SpO2 04/01/24 1116 94 %     Weight --      Height --      Head Circumference --      Peak Flow --      Pain Score 04/01/24 1120 0     Pain Loc --      Pain Education --      Exclude from Growth Chart --     Most recent vital signs: Vitals:   04/01/24 1116  BP: (!) 147/76  Pulse: 68  Resp: 18  Temp: 97.9 F (36.6 C)  SpO2: 94%     General: Awake, no distress.  CV:  Good peripheral perfusion.  Resp:  Normal effort.  Occasional scattered wheezing Abd:  No distention.  Other:     ED Results / Procedures / Treatments   Labs (all labs ordered are listed, but only abnormal results are displayed) Labs Reviewed  BASIC  METABOLIC PANEL WITH GFR - Abnormal; Notable for the following components:      Result Value   Sodium 131 (*)    Chloride 95 (*)    Glucose, Bld 500 (*)    BUN 40 (*)    Creatinine, Ser 1.61 (*)    GFR, Estimated 31 (*)    All other components within normal limits  CBG MONITORING, ED - Abnormal; Notable for the following components:   Glucose-Capillary 459 (*)    All other components within normal limits  CBC     EKG  ED ECG REPORT I, Reche CHRISTELLA Leventhal, the attending physician, personally viewed and interpreted this ECG.  Date: 04/01/2024 Rate: 69 bpm Rhythm: normal sinus rhythm QRS Axis: normal Intervals: normal ST/T Wave abnormalities: normal Narrative Interpretation: no evidence of acute ischemia    RADIOLOGY I have independently reviewed and interpreted the patient's chest x-ray is no acute pathology    PROCEDURES:  Critical Care performed: No  Procedures  MEDICATIONS ORDERED IN ED: Medications - No data to display   IMPRESSION / MDM / ASSESSMENT AND PLAN / ED COURSE  I reviewed the triage vital signs and the nursing notes.                              Differential diagnosis includes, but is not limited to, DKA, asthma exacerbation, COPD exacerbation, pneumonia, electrolyte abnormalities, HHS  Patient's presentation is most consistent with exacerbation of chronic illness.  Patient is an 84 year old female with past medical history of diabetes, hypertension, CKD, presenting to the emergency department complaining of hyperglycemia and shortness of breath.  Chest x-ray, EKG, and lab work ordered for further evaluation.  IV fluids and DuoNeb ordered.  After low 1 L of LR the patient's blood sugar only decreased to 484.  10 units of subcutaneous insulin  ordered.  Patient's blood work is overall unremarkable aside from the elevated glucose.  Her creatinine appears to be near her baseline.  Her anion gap is 15.  Bicarb is 22.  Normal white blood cell  count.  On recheck the patient's blood sugar has only decreased to 450.  10 units of insulin  ordered again.  Patient reports that she would like to be discharged today and that she has an appointment with her PCP on Friday for reevaluation.  Discussed with the patient plan for recheck of her glucose after insulin .  If her glucose is improving, vital signs remained stable.  When she was feeling improved she may be discharged to keep her appointment on Friday with her PCP.        FINAL CLINICAL IMPRESSION(S) / ED DIAGNOSES   Final diagnoses:  Hyperglycemia     Rx / DC Orders   ED Discharge Orders     None        Note:  This document was prepared using Dragon voice recognition software and may include unintentional dictation errors.   Rexford Reche HERO, MD 04/01/24 1516  "

## 2024-04-01 NOTE — ED Triage Notes (Signed)
 Pt reports hyperglycemia and SHOB that started 2 days ago. Pt reports she has been using her inhaler at home with minimal relief.

## 2024-04-01 NOTE — ED Provider Notes (Signed)
----------------------------------------- °  5:04 PM on 04/01/2024 -----------------------------------------  I took over care of this patient from Dr. Rexford.  Repeat glucose after additional insulin  is 300.  The patient remains comfortable appearing and is eager to go home.  She is stable for discharge at this time.  Return precautions have been provided, and she expressed understanding.   Jacolyn Pae, MD 04/01/24 1704

## 2024-04-09 ENCOUNTER — Other Ambulatory Visit: Payer: Self-pay

## 2024-04-09 ENCOUNTER — Observation Stay
Admission: EM | Admit: 2024-04-09 | Discharge: 2024-04-11 | Disposition: A | Discharge status: Home / Self Care | Attending: Emergency Medicine | Admitting: Emergency Medicine

## 2024-04-09 DIAGNOSIS — E11649 Type 2 diabetes mellitus with hypoglycemia without coma: Principal | ICD-10-CM | POA: Insufficient documentation

## 2024-04-09 DIAGNOSIS — I129 Hypertensive chronic kidney disease with stage 1 through stage 4 chronic kidney disease, or unspecified chronic kidney disease: Secondary | ICD-10-CM | POA: Insufficient documentation

## 2024-04-09 DIAGNOSIS — I10 Essential (primary) hypertension: Secondary | ICD-10-CM

## 2024-04-09 DIAGNOSIS — J45909 Unspecified asthma, uncomplicated: Secondary | ICD-10-CM | POA: Diagnosis not present

## 2024-04-09 DIAGNOSIS — E160 Drug-induced hypoglycemia without coma: Principal | ICD-10-CM

## 2024-04-09 DIAGNOSIS — E1122 Type 2 diabetes mellitus with diabetic chronic kidney disease: Secondary | ICD-10-CM | POA: Diagnosis not present

## 2024-04-09 DIAGNOSIS — Z79899 Other long term (current) drug therapy: Secondary | ICD-10-CM | POA: Insufficient documentation

## 2024-04-09 DIAGNOSIS — E876 Hypokalemia: Secondary | ICD-10-CM | POA: Diagnosis not present

## 2024-04-09 DIAGNOSIS — IMO0002 Reserved for concepts with insufficient information to code with codable children: Secondary | ICD-10-CM

## 2024-04-09 DIAGNOSIS — N1831 Chronic kidney disease, stage 3a: Secondary | ICD-10-CM | POA: Diagnosis not present

## 2024-04-09 DIAGNOSIS — E162 Hypoglycemia, unspecified: Secondary | ICD-10-CM | POA: Diagnosis present

## 2024-04-09 LAB — GLUCOSE, CAPILLARY: Glucose-Capillary: 228 mg/dL — ABNORMAL HIGH (ref 70–99)

## 2024-04-09 LAB — CBC WITH DIFFERENTIAL/PLATELET
Abs Immature Granulocytes: 0.08 K/uL — ABNORMAL HIGH (ref 0.00–0.07)
Basophils Absolute: 0.1 K/uL (ref 0.0–0.1)
Basophils Relative: 1 %
Eosinophils Absolute: 0.3 K/uL (ref 0.0–0.5)
Eosinophils Relative: 2 %
HCT: 36.9 % (ref 36.0–46.0)
Hemoglobin: 12 g/dL (ref 12.0–15.0)
Immature Granulocytes: 1 %
Lymphocytes Relative: 13 %
Lymphs Abs: 1.5 K/uL (ref 0.7–4.0)
MCH: 30.1 pg (ref 26.0–34.0)
MCHC: 32.5 g/dL (ref 30.0–36.0)
MCV: 92.5 fL (ref 80.0–100.0)
Monocytes Absolute: 0.8 K/uL (ref 0.1–1.0)
Monocytes Relative: 7 %
Neutro Abs: 8.9 K/uL — ABNORMAL HIGH (ref 1.7–7.7)
Neutrophils Relative %: 76 %
Platelets: 333 K/uL (ref 150–400)
RBC: 3.99 MIL/uL (ref 3.87–5.11)
RDW: 12.6 % (ref 11.5–15.5)
WBC: 11.6 K/uL — ABNORMAL HIGH (ref 4.0–10.5)
nRBC: 0 % (ref 0.0–0.2)

## 2024-04-09 LAB — BASIC METABOLIC PANEL WITH GFR
Anion gap: 13 (ref 5–15)
BUN: 24 mg/dL — ABNORMAL HIGH (ref 8–23)
CO2: 23 mmol/L (ref 22–32)
Calcium: 9.5 mg/dL (ref 8.9–10.3)
Chloride: 102 mmol/L (ref 98–111)
Creatinine, Ser: 1.14 mg/dL — ABNORMAL HIGH (ref 0.44–1.00)
GFR, Estimated: 48 mL/min — ABNORMAL LOW
Glucose, Bld: 222 mg/dL — ABNORMAL HIGH (ref 70–99)
Potassium: 3.3 mmol/L — ABNORMAL LOW (ref 3.5–5.1)
Sodium: 138 mmol/L (ref 135–145)

## 2024-04-09 LAB — CBG MONITORING, ED
Glucose-Capillary: 182 mg/dL — ABNORMAL HIGH (ref 70–99)
Glucose-Capillary: 55 mg/dL — ABNORMAL LOW (ref 70–99)
Glucose-Capillary: 131 mg/dL — ABNORMAL HIGH (ref 70–99)

## 2024-04-09 MED ORDER — HYPROMELLOSE (GONIOSCOPIC) 2.5 % OP SOLN: 1.0000 [drp] | Freq: Four times a day (QID) | OPHTHALMIC | Status: DC | PRN

## 2024-04-09 MED ORDER — ALBUTEROL SULFATE (2.5 MG/3ML) 0.083% IN NEBU: 2.5000 mg | INHALATION_SOLUTION | RESPIRATORY_TRACT | Status: DC | PRN

## 2024-04-09 MED ORDER — PANTOPRAZOLE SODIUM 20 MG PO TBEC
20.0000 mg | DELAYED_RELEASE_TABLET | Freq: Every day | ORAL | Status: DC
  Administered 2024-04-09 – 2024-04-11 (×3): 20 mg via ORAL
  Filled 2024-04-09 (×4): qty 1

## 2024-04-09 MED ORDER — BENZONATATE 100 MG PO CAPS: 200.0000 mg | ORAL_CAPSULE | Freq: Three times a day (TID) | ORAL | Status: DC | PRN

## 2024-04-09 MED ORDER — ONDANSETRON HCL 4 MG/2ML IJ SOLN
4.0000 mg | Freq: Four times a day (QID) | INTRAMUSCULAR | Status: DC | PRN
  Administered 2024-04-10: 4 mg via INTRAVENOUS
  Filled 2024-04-09: qty 2

## 2024-04-09 MED ORDER — FLUTICASONE FUROATE-VILANTEROL 100-25 MCG/ACT IN AEPB
1.0000 | INHALATION_SPRAY | Freq: Every day | RESPIRATORY_TRACT | Status: DC
  Administered 2024-04-10 – 2024-04-11 (×2): 1 via RESPIRATORY_TRACT
  Filled 2024-04-09 (×2): qty 28

## 2024-04-09 MED ORDER — ACETAMINOPHEN 650 MG RE SUPP: 650.0000 mg | Freq: Four times a day (QID) | RECTAL | Status: DC | PRN

## 2024-04-09 MED ORDER — ENOXAPARIN SODIUM 40 MG/0.4ML IJ SOSY: 40.0000 mg | PREFILLED_SYRINGE | INTRAMUSCULAR | Status: DC

## 2024-04-09 MED ORDER — AMLODIPINE BESYLATE 10 MG PO TABS
10.0000 mg | ORAL_TABLET | Freq: Every day | ORAL | Status: DC
  Administered 2024-04-10 – 2024-04-11 (×2): 10 mg via ORAL
  Filled 2024-04-09 (×2): qty 1

## 2024-04-09 MED ORDER — ATORVASTATIN CALCIUM 20 MG PO TABS
20.0000 mg | ORAL_TABLET | Freq: Every day | ORAL | Status: DC
  Administered 2024-04-10 – 2024-04-11 (×2): 20 mg via ORAL
  Filled 2024-04-09 (×2): qty 1

## 2024-04-09 MED ORDER — SODIUM CHLORIDE 0.9% FLUSH
3.0000 mL | Freq: Two times a day (BID) | INTRAVENOUS | Status: DC
  Administered 2024-04-09 – 2024-04-11 (×4): 3 mL via INTRAVENOUS

## 2024-04-09 MED ORDER — POTASSIUM CHLORIDE CRYS ER 20 MEQ PO TBCR
20.0000 meq | EXTENDED_RELEASE_TABLET | ORAL | Status: AC
  Administered 2024-04-09 – 2024-04-10 (×3): 20 meq via ORAL
  Filled 2024-04-09 (×3): qty 1

## 2024-04-09 MED ORDER — DEXTROSE 5 % AND 0.9 % NACL IV BOLUS
1000.0000 mL | Freq: Once | INTRAVENOUS | Status: AC
  Administered 2024-04-09: 1000 mL via INTRAVENOUS
  Filled 2024-04-09: qty 1000

## 2024-04-09 MED ORDER — ACETAMINOPHEN 325 MG PO TABS: 650.0000 mg | ORAL_TABLET | Freq: Four times a day (QID) | ORAL | Status: DC | PRN

## 2024-04-09 MED ORDER — ENOXAPARIN SODIUM 30 MG/0.3ML IJ SOSY
30.0000 mg | PREFILLED_SYRINGE | INTRAMUSCULAR | Status: DC
  Administered 2024-04-09 – 2024-04-10 (×2): 30 mg via SUBCUTANEOUS
  Filled 2024-04-09 (×2): qty 0.3

## 2024-04-09 MED ORDER — LEVOTHYROXINE SODIUM 50 MCG PO TABS
50.0000 ug | ORAL_TABLET | Freq: Every day | ORAL | Status: DC
  Administered 2024-04-10 – 2024-04-11 (×2): 50 ug via ORAL
  Filled 2024-04-09 (×2): qty 1

## 2024-04-09 MED ORDER — ASPIRIN 81 MG PO TBEC
81.0000 mg | DELAYED_RELEASE_TABLET | Freq: Every day | ORAL | Status: DC
  Administered 2024-04-09 – 2024-04-11 (×3): 81 mg via ORAL
  Filled 2024-04-09 (×3): qty 1

## 2024-04-09 MED ORDER — SENNOSIDES-DOCUSATE SODIUM 8.6-50 MG PO TABS
1.0000 | ORAL_TABLET | Freq: Every evening | ORAL | Status: DC | PRN
  Administered 2024-04-10: 1 via ORAL
  Filled 2024-04-09 (×2): qty 1

## 2024-04-09 MED ORDER — POLYVINYL ALCOHOL 1.4 % OP SOLN: 1.0000 [drp] | Freq: Four times a day (QID) | OPHTHALMIC | Status: DC | PRN

## 2024-04-09 MED ORDER — INSULIN ASPART 100 UNIT/ML IJ SOLN
0.0000 [IU] | INTRAMUSCULAR | Status: DC
  Administered 2024-04-09: 4 [IU] via SUBCUTANEOUS
  Filled 2024-04-09: qty 4

## 2024-04-09 MED ORDER — ONDANSETRON HCL 4 MG PO TABS: 4.0000 mg | ORAL_TABLET | Freq: Four times a day (QID) | ORAL | Status: DC | PRN

## 2024-04-09 NOTE — ED Provider Notes (Signed)
 "  [facility] Provider Note    Event Date/Time   First MD Initiated Contact with Patient 04/09/24 2022     (approximate)   History   Hypoglycemia ( 83 y/o F, BIBA, from home for low blood sugar.  Per EMS pts FSBS was in the 20's upon their arrival at pts home.  Pt received 250mls of D10 from EMS en route to the ER.)   HPI  Felicia Roberts is a 84 y.o. female who who reports that she took her first dose of insulin  14 units this morning.  It was just prescribed to her.  She was going about her morning normally, she started cleaning felt a little off.  Her daughter called her this evening and noticed her speech seemed slurred.  When daughter went to the house that she noticed the patient was not able to open the door.  She seemed confused.  They called EMS and her blood sugar was low.  After receiving glucose with EMS she is now feels fine and normal.  The patient reports she does not really remember what happened  She is not had a recent illness except she was seen for high blood sugar about a week ago in the ER.  No fevers no chills no nausea vomiting no pain anywhere no headache.  She feels fine now  Reviewed external records from endocrinology Dr. Diamond  Past Medical History:  Diagnosis Date   Asthma    Chicken pox    Chronic kidney disease    Colon polyps    Diabetes mellitus without complication (HCC)    Diverticulosis    GERD (gastroesophageal reflux disease)    Hyperlipidemia    Hypertension    Type 2 diabetes.  Chronic kidney disease  Physical Exam   Triage Vital Signs: ED Triage Vitals  Encounter Vitals Group     BP 04/09/24 1923 (!) 150/65     Girls Systolic BP Percentile --      Girls Diastolic BP Percentile --      Boys Systolic BP Percentile --      Boys Diastolic BP Percentile --      Pulse Rate 04/09/24 1923 (!) 58     Resp 04/09/24 1923 18     Temp 04/09/24 1923 97.9 F (36.6 C)     Temp Source 04/09/24 1923 Oral     SpO2  04/09/24 1923 100 %     Weight 04/09/24 1928 119 lb 14.9 oz (54.4 kg)     Height 04/09/24 1928 5' 1 (1.549 m)     Head Circumference --      Peak Flow --      Pain Score 04/09/24 1930 0     Pain Loc --      Pain Education --      Exclude from Growth Chart --     Most recent vital signs: Vitals:   04/09/24 1923  BP: (!) 150/65  Pulse: (!) 58  Resp: 18  Temp: 97.9 F (36.6 C)  SpO2: 100%     General: Awake, no distress.  CV:   Good peripheral perfusion. Normal rate and heart tones. Resp:   Normal effort. Speaking without distress. Abd:   No distention. Neuro:   No focal neuro deficits noted. Moves extremities well without noted concern.  Moves all extremities.  Fully alert.  Pleasant. Other:     ED Results / Procedures / Treatments   Labs (all labs ordered are listed, but only abnormal  results are displayed) Labs Reviewed  CBC WITH DIFFERENTIAL/PLATELET - Abnormal; Notable for the following components:      Result Value   WBC 11.6 (*)    Neutro Abs 8.9 (*)    Abs Immature Granulocytes 0.08 (*)    All other components within normal limits  BASIC METABOLIC PANEL WITH GFR - Abnormal; Notable for the following components:   Potassium 3.3 (*)    Glucose, Bld 222 (*)    BUN 24 (*)    Creatinine, Ser 1.14 (*)    GFR, Estimated 48 (*)    All other components within normal limits  CBG MONITORING, ED - Abnormal; Notable for the following components:   Glucose-Capillary 182 (*)    All other components within normal limits  CBG MONITORING, ED - Abnormal; Notable for the following components:   Glucose-Capillary 55 (*)    All other components within normal limits  URINALYSIS, ROUTINE W REFLEX MICROSCOPIC  CBG MONITORING, ED  CBG MONITORING, ED  CBG MONITORING, ED  CBG MONITORING, ED  CBG MONITORING, ED  CBG MONITORING, ED  CBG MONITORING, ED   Labs notable for slight leukocytosis.  Glucose 222.  After a couple of hours recheck glucose  55.    PROCEDURES:  Critical Care performed: Yes, see critical care procedure note(s)  Procedures CRITICAL CARE Performed by: Oneil Budge   Total critical care time: 30 minutes  Critical care time was exclusive of separately billable procedures and treating other patients.  Critical care was necessary to treat or prevent imminent or life-threatening deterioration.  Critical care was time spent personally by me on the following activities: development of treatment plan with patient and/or surrogate as well as nursing, discussions with consultants, evaluation of patient's response to treatment, examination of patient, obtaining history from patient or surrogate, ordering and performing treatments and interventions, ordering and review of laboratory studies, ordering and review of radiographic studies, pulse oximetry and re-evaluation of patient's condition.  Recurrent episode of hypoglycemia requiring IV dextrose  infusion and admission to the hospital.  MEDICATIONS ORDERED IN ED: Medications  dextrose  5 % and 0.9% NaCl 5-0.9 % bolus 1,000 mL (has no administration in time range)     IMPRESSION / MDM / ASSESSMENT AND PLAN / ED COURSE  I reviewed the triage vital signs and the nursing notes.                              Based on presentation, the differential diagnosis includes, but is not limited to key considerations: altered mental DIFFERENTIAL DIAGNOSIS CONSIDERATIONS: High-acuity etiologies considered and prioritized for rule-out: - Hypoglycemia - Hypoxia / Hypercarbia - Sepsis / Meningitis / Encephalitis - Intracranial Hemorrhage / Stroke - Wernicke's Encephalopathy - Hypertensive Encephalopathy - Toxidrome / Withdrawal - Thyroid  Storm / Myxedema  In this particular setting the patient's altered mental status almost assuredly due to hypoglycemia likely with some element of iatrogenic cause.  It appears that she would likely give herself the correct dose reporting she  used pen and adjusted to 14 units this morning.  She only used 1 dose.  She is neurologically normal with no complaints at the time of arrival.  All symptoms corrected with improvement in glucose.  Workup focused on identifying reversible metabolic, infectious, or structural causes.    Patient's presentation is most consistent with acute complicated illness / injury requiring diagnostic workup.    The patient is on the cardiac monitor to evaluate for evidence  of arrhythmia and/or significant heart rate changes.  Clinical Course as of 04/09/24 2116  Thu Apr 09, 2024  2112 Patient glucose 55.  She is now drinking juice eating ice cream.  Fully awake and alert.  Will start on D5 NS.  Will continue to follow her glucose carefully continue to monitor.  Admitted excepted to hospitalist service by Dr. Davonna (spelling) [MQ]    Clinical Course User Index [MQ] Dicky Anes, MD     FINAL CLINICAL IMPRESSION(S) / ED DIAGNOSES   Final diagnoses:  Iatrogenic hypoglycemia     Rx / DC Orders   ED Discharge Orders     None        Note:  This document was prepared using Dragon voice recognition software and may include unintentional dictation errors.   Dicky Anes, MD 04/09/24 2116  "

## 2024-04-09 NOTE — H&P (Signed)
 ory and Physical    Patient: Felicia Roberts FMW:[id number] DOB: Mar 11, 1941 DOA: 04/09/2024 DOS: the patient was seen and examined on 04/09/2024 PCP: Sadie Manna, MD  Patient coming from: Home  Chief Complaint:  Chief Complaint  Patient presents with   Hypoglycemia     84 y/o F, BIBA, from home for low blood sugar.  Per EMS pts FSBS was in the 20's upon their arrival at pts home.  Pt received 250mls of D10 from EMS en route to the ER.   HPI: Felicia Roberts is a 84 y.o. female with medical history significant of type 2 diabetes mellitus who was initiated on insulin  glargine today.  She presents to the emergency room after family found her disoriented and confused.  When EMS was called to the home, blood glucose was noted to be in the 20s.  Patient apparently had been on oral hypoglycemics previously but had erratic blood glucose.  Last A1c was 13.6.   Endocrinologist recommended insulin  glargine after her last visit on 04/01/2024.  Her first dose apparently was earlier today.  She later was noted to be disoriented after family tried to reach her later in the afternoon.  When daughter got home, patient could barely follow commands to open the door.  She got her spare keys and was able to enter home.  Found patient to be impaired disoriented.  EMS was immediately called to the home.  Blood glucose checked at that time recorded to be in the 20s.  She was given a dose of 25 g of glucose.  Patient is back to baseline.  On presentation blood glucose was 222.  After few hours in the ED, blood glucose dropped again to 55.  Patient is now initiated on dextrose  fluids.  Will monitor blood glucose on current regimen.   Review of Systems: As mentioned in the history of present illness. All other systems reviewed and are negative. Past Medical History:  Diagnosis Date   Asthma    Chicken pox    Chronic kidney disease    Colon polyps    Diabetes mellitus without complication (HCC)    Diverticulosis     GERD (gastroesophageal reflux disease)    Hyperlipidemia    Hypertension    Past Surgical History:  Procedure Laterality Date   ARTERY BIOPSY Left 03/15/2021   Procedure: BIOPSY TEMPORAL ARTERY;  Surgeon: Rodolph Romano, MD;  Location: [facility] ORS;  Service: General;  Laterality: Left;   BLADDER SURGERY     COLONOSCOPY WITH PROPOFOL  N/A 09/25/2017   Procedure: COLONOSCOPY WITH PROPOFOL ;  Surgeon: Toledo, Ladell POUR, MD;  Location: [facility] ENDOSCOPY;  Service: Gastroenterology;  Laterality: N/A;   ESOPHAGOGASTRODUODENOSCOPY N/A 12/30/2020   Procedure: ESOPHAGOGASTRODUODENOSCOPY (EGD);  Surgeon: Onita Elspeth Sharper, DO;  Location: [facility] ENDOSCOPY;  Service: Gastroenterology;  Laterality: N/A;   ESOPHAGOGASTRODUODENOSCOPY (EGD) WITH PROPOFOL  N/A 09/25/2017   Procedure: ESOPHAGOGASTRODUODENOSCOPY (EGD) WITH PROPOFOL ;  Surgeon: Toledo, Ladell POUR, MD;  Location: [facility] ENDOSCOPY;  Service: Gastroenterology;  Laterality: N/A;   EYE SURGERY     URETHRAL DIVERTICULUM REPAIR  1980   Social History:  reports that she has never smoked. She has never used smokeless tobacco. She reports that she does not drink alcohol  and does not use drugs.  Allergies[1]  Family History  Problem Relation Age of Onset   Cancer Father        prostate   Diabetes Sister    Hypertension Sister    AAA (abdominal aortic aneurysm) Brother    Diabetes Sister  Hypertension Sister    Diabetes Sister    Hypertension Sister    Diabetes Sister    Hypertension Sister    Hypertension Mother    Heart disease Mother    Diabetes Mother    Breast cancer Neg Hx     Prior to Admission medications  Medication Sig Start Date End Date Taking? Authorizing Provider  glipiZIDE (GLUCOTROL) 5 MG tablet Take 5 mg by mouth daily before breakfast. Glipizide XL   Yes [provider]  albuterol  (PROVENTIL  HFA;VENTOLIN  HFA) 108 (90 Base) MCG/ACT inhaler INHALE ONE TO TWO PUFFS BY MOUTH EVERY 6 HOURS AS NEEDED FOR WHEEZING OR   SHORTNESS  OF  BREATH 12/19/17   Maribeth Camellia MATSU, MD  amLODipine  (NORVASC ) 5 MG tablet Take 2 tablets (10 mg total) by mouth daily. 09/10/22   Darci Pore, MD  Ascorbic Acid (VITAMIN C GUMMIE PO) Take by mouth.    [provider]  atorvastatin  (LIPITOR) 40 MG tablet Take 1 tablet (40 mg total) by mouth daily. Patient taking differently: Take 20 mg by mouth daily. 09/04/17   Maribeth Camellia MATSU, MD  B Complex Vitamins (VITAMIN-B COMPLEX PO) Take by mouth daily.    [provider]  benzonatate  (TESSALON ) 200 MG capsule Take 200 mg by mouth 3 (three) times daily as needed. 11/18/23   [provider]  Blood Glucose Monitoring Suppl (ACCU-CHEK GUIDE ME) w/Device KIT as directed. 09/30/23   [provider]  BREYNA 160-4.5 MCG/ACT inhaler Inhale into the lungs. 05/28/23   [provider]  fluconazole (DIFLUCAN) 150 MG tablet Take 150 mg by mouth once. 08/10/23   [provider]  hydroxypropyl methylcellulose / hypromellose (ISOPTO TEARS / GONIOVISC) 2.5 % ophthalmic solution Place 1 drop into both eyes 4 (four) times daily as needed for dry eyes.    [provider]  levothyroxine  (SYNTHROID ) 50 MCG tablet Take 1 tablet (50 mcg total) by mouth daily before breakfast. 09/10/22   Darci Pore, MD  losartan  (COZAAR ) 50 MG tablet Take 1 tablet (50 mg total) by mouth 2 (two) times daily for 10 days. 12/19/23 12/29/23  Bradler, Evan K, MD  methotrexate (RHEUMATREX) 2.5 MG tablet Take 15 mg by mouth once a week. 10/16/23   [provider]  miconazole  (MICOTIN) 2 % cream Apply 1 Application topically 2 (two) times daily. 06/26/23   White, Shelba SAUNDERS, NP  pantoprazole  (PROTONIX ) 20 MG tablet Take 20 mg by mouth daily.    [provider]  pioglitazone  (ACTOS ) 30 MG tablet Take 30 mg by mouth daily.    [provider]  promethazine-dextromethorphan (PROMETHAZINE-DM) 6.25-15 MG/5ML syrup Take 5 mLs by mouth every 8 (eight)  hours as needed. 11/18/23   [provider]  VITAMIN D PO Take by mouth daily.    [provider]    Physical Exam: Vitals:   04/09/24 1923 04/09/24 1928  BP: (!) 150/65   Pulse: (!) 58   Resp: 18   Temp: 97.9 F (36.6 C)   TempSrc: Oral   SpO2: 100%   Weight:  54.4 kg  Height:  5' 1 (1.549 m)   General: Patient is a frail elderly female who was seen laying comfortably in bed.  She is alert oriented x 3.  Not in any acute distress. HEENT: Head is atraumatic, neck supple. Neck: Supple with no JVD. Chest: Clinically clear with no added sounds. Cardiovascular: Regular S1-S2 with no murmur. Abdomen flat soft nontender.  No organomegaly appreciated.  Bowel sounds  present and normal Extremities without any pedal edema CNS without any focal deficits  Data Reviewed: Sodium is 138, potassium 3.3, chloride is 102, bicarb 23, glucose 222, BUN 24, creatinine 1.4, calcium  9.5, WBC 11.6, hemoglobin 12, hematocrit 37, platelet count 333, neutrophil count 76,    Assessment and Plan: 84 year old female with type 2 diabetes mellitus who was recently started on insulin , presents with profound hypoglycemia  Hypoglycemia: insulin  induced.  Will hold insulin  glargine for today.  Monitor fingerstick glucose per protocol.  Due to recurrence of hypoglycemia, patient was initiated on dextrose  fluids.  Will monitor fingerstick glucose on current regimen.  Will consider reinitiating insulin  glargine at a lower dose.  Patient will need extensive diabetic education  Chronic type 2 diabetes mellitus: Last A1c was 13.6.  Patient was initiated on insulin  regimen recently with first dose earlier today.  Chronic history of asthma: Patient is well-controlled diabetes melitis attributed to be recurrent use of corticosteroids.  Chronic kidney disease stage IIIa.Stable  Hypokalemia: Will replete potassium per protocol.   Advance Care Planning:   Code Status: Full Code   Consults:  Endocrinology consult  Family Communication: Discussed plan of care with patient's daughter  Severity of Illness: The appropriate patient status for this patient is OBSERVATION. Observation status is judged to be reasonable and necessary in order to provide the required intensity of service to ensure the patient's safety. The patient's presenting symptoms, physical exam findings, and initial radiographic and laboratory data in the context of their medical condition is felt to place them at decreased risk for further clinical deterioration. Furthermore, it is anticipated that the patient will be medically stable for discharge from the hospital within 2 midnights of admission.   Author: Maude MARLA Dart, MD 04/09/2024 9:42 PM  For on call review [email or web address].     [1]  Allergies Allergen Reactions   Farxiga  [Dapagliflozin ] Anaphylaxis   Toviaz  [Fesoterodine  Fumarate Er] Other (See Comments)    Memory loss   Januvia  [Sitagliptin ] Rash

## 2024-04-09 NOTE — ED Triage Notes (Signed)
"   84 y/o F, BIBA, from home for low blood sugar.  Per EMS pts FSBS was in the 20's upon their arrival at pts home.  Pt received 250mls of D10 from EMS en route to the ER. "

## 2024-04-10 ENCOUNTER — Telehealth (HOSPITAL_COMMUNITY): Payer: Self-pay

## 2024-04-10 ENCOUNTER — Other Ambulatory Visit (HOSPITAL_COMMUNITY): Payer: Self-pay

## 2024-04-10 DIAGNOSIS — E162 Hypoglycemia, unspecified: Secondary | ICD-10-CM | POA: Diagnosis not present

## 2024-04-10 LAB — GLUCOSE, CAPILLARY
Glucose-Capillary: 103 mg/dL — ABNORMAL HIGH (ref 70–99)
Glucose-Capillary: 191 mg/dL — ABNORMAL HIGH (ref 70–99)
Glucose-Capillary: 234 mg/dL — ABNORMAL HIGH (ref 70–99)
Glucose-Capillary: 43 mg/dL — CL (ref 70–99)
Glucose-Capillary: 51 mg/dL — ABNORMAL LOW (ref 70–99)
Glucose-Capillary: 66 mg/dL — ABNORMAL LOW (ref 70–99)
Glucose-Capillary: 68 mg/dL — ABNORMAL LOW (ref 70–99)
Glucose-Capillary: 69 mg/dL — ABNORMAL LOW (ref 70–99)
Glucose-Capillary: 84 mg/dL (ref 70–99)

## 2024-04-10 LAB — BASIC METABOLIC PANEL WITH GFR
Anion gap: 11 (ref 5–15)
BUN: 26 mg/dL — ABNORMAL HIGH (ref 8–23)
CO2: 23 mmol/L (ref 22–32)
Calcium: 10.2 mg/dL (ref 8.9–10.3)
Chloride: 107 mmol/L (ref 98–111)
Creatinine, Ser: 1.17 mg/dL — ABNORMAL HIGH (ref 0.44–1.00)
GFR, Estimated: 46 mL/min — ABNORMAL LOW
Glucose, Bld: 41 mg/dL — CL (ref 70–99)
Potassium: 3.2 mmol/L — ABNORMAL LOW (ref 3.5–5.1)
Sodium: 141 mmol/L (ref 135–145)

## 2024-04-10 LAB — CBC
HCT: 38.1 % (ref 36.0–46.0)
Hemoglobin: 12.9 g/dL (ref 12.0–15.0)
MCH: 30.4 pg (ref 26.0–34.0)
MCHC: 33.9 g/dL (ref 30.0–36.0)
MCV: 89.9 fL (ref 80.0–100.0)
Platelets: 395 K/uL (ref 150–400)
RBC: 4.24 MIL/uL (ref 3.87–5.11)
RDW: 12.6 % (ref 11.5–15.5)
WBC: 8.7 K/uL (ref 4.0–10.5)
nRBC: 0 % (ref 0.0–0.2)

## 2024-04-10 LAB — HEMOGLOBIN A1C
Hgb A1c MFr Bld: 14.1 % — ABNORMAL HIGH (ref 4.8–5.6)
Mean Plasma Glucose: 357.97 mg/dL

## 2024-04-10 MED ORDER — LIVING WELL WITH DIABETES BOOK - IN SPANISH
Freq: Once | Status: AC
  Filled 2024-04-10: qty 1

## 2024-04-10 MED ORDER — MELATONIN 5 MG PO TABS
5.0000 mg | ORAL_TABLET | Freq: Once | ORAL | Status: AC
  Administered 2024-04-10: 5 mg via ORAL
  Filled 2024-04-10: qty 1

## 2024-04-10 MED ORDER — INSULIN STARTER KIT- PEN NEEDLES (SPANISH)
1.0000 | Freq: Once | Status: AC
  Administered 2024-04-10: 1
  Filled 2024-04-10: qty 1

## 2024-04-10 MED ORDER — DEXTROSE 50 % IV SOLN
50.0000 mL | Freq: Once | INTRAVENOUS | Status: AC
  Administered 2024-04-10: 50 mL via INTRAVENOUS
  Filled 2024-04-10: qty 50

## 2024-04-10 MED ORDER — INSULIN ASPART 100 UNIT/ML IJ SOLN
0.0000 [IU] | Freq: Three times a day (TID) | INTRAMUSCULAR | Status: DC
  Administered 2024-04-10: 2 [IU] via SUBCUTANEOUS
  Filled 2024-04-10: qty 2

## 2024-04-10 MED ORDER — DEXTROSE 5 % IV SOLN: INTRAVENOUS | Status: DC

## 2024-04-10 MED ORDER — INSULIN ASPART 100 UNIT/ML IJ SOLN: 0.0000 [IU] | Freq: Every day | INTRAMUSCULAR | Status: DC

## 2024-04-10 NOTE — Progress Notes (Signed)
 " Progress Note   Patient: Felicia Roberts FMW:[id number] DOB: [birth year] DOA: 04/09/2024     0 DOS: the patient was seen and examined on 04/10/2024   Brief hospital course: From HPI Felicia Roberts is a 84 y.o. female with medical history significant of type 2 diabetes mellitus who was initiated on insulin  glargine today.  She presents to the emergency room after family found her disoriented and confused.  When EMS was called to the home, blood glucose was noted to be in the 20s.  Patient apparently had been on oral hypoglycemics previously but had erratic blood glucose.  Last A1c was 13.6.   Endocrinologist recommended insulin  glargine after her last visit on 04/01/2024.  Her first dose apparently was earlier today.  She later was noted to be disoriented after family tried to reach her later in the afternoon.  When daughter got home, patient could barely follow commands to open the door.  She got her spare keys and was able to enter home.  Found patient to be impaired disoriented.  EMS was immediately called to the home.  Blood glucose checked at that time recorded to be in the 20s.  She was given a dose of 25 g of glucose.  Patient is back to baseline.   On presentation blood glucose was 222.  After few hours in the ED, blood glucose dropped again to 55.  Patient is now initiated on dextrose  fluids.  Will monitor blood glucose on current regimen.      Assessment and Plan:   Hypoglycemia: insulin  induced as well as taking high dose of glipizide  and not eating.   Hypoglycemia protocol in place  Chronic type 2 diabetes mellitus: Last A1c was 13.6.   Currently hypoglycemic.   Patient was initiated on insulin  regimen We have decreased the dose of insulin  Continue to monitor glucose level closely and give dextrose  needed   Chronic history of asthma:  Continue as needed inhalers   Chronic kidney disease stage IIIa.Stable Continue monitoring renal function  Hypokalemia: Continue repletion  and monitoring    Advance Care Planning:   Code Status: Full Code      Family Communication: Discussed with patient, no family at bedside   Subjective:  Patient seen and examined at bedside this morning Has been experiencing intermittent hypoglycemia as low as 41 Denies nausea vomiting chest pain or cough Patient was initially planned for discharge later this afternoon however sugar checks remain low in the 50s to 60s intermittently  Physical Exam:  General: Elderly female laying in bed in no acute distress Neck: Supple with no JVD. Chest: Clinically clear with no added sounds. Cardiovascular: Regular S1-S2 with no murmur. Abdomen flat soft nontender.  No organomegaly appreciated.  Bowel sounds present and normal Extremities without any pedal edema CNS without any focal deficits   Data reviewed:  Vitals:   04/09/24 2315 04/10/24 0341 04/10/24 0752 04/10/24 1546  BP: (!) 167/77 (!) 174/83 129/65 (!) 159/75  Pulse: 84 89 (!) 58 95  Resp: 19 17 16 16   Temp:  98.3 F (36.8 C) (!) 97.4 F (36.3 C) 98.2 F (36.8 C)  TempSrc:  Oral    SpO2: 98% 97% 100% 96%  Weight:      Height:          Latest Ref Rng & Units 04/10/2024    5:45 AM 04/09/2024    7:28 PM 04/01/2024   11:23 AM  CBC  WBC 4.0 - 10.5 K/uL 8.7  11.6  8.0  Hemoglobin 12.0 - 15.0 g/dL 87.0  87.9  86.9   Hematocrit 36.0 - 46.0 % 38.1  36.9  38.2   Platelets 150 - 400 K/uL 395  333  357        Latest Ref Rng & Units 04/10/2024    5:45 AM 04/09/2024    7:28 PM 04/01/2024   11:23 AM  BMP  Glucose 70 - 99 mg/dL 41  777  499   BUN 8 - 23 mg/dL 26  24  40   Creatinine 0.44 - 1.00 mg/dL 8.82  8.85  8.38   Sodium 135 - 145 mmol/L 141  138  131   Potassium 3.5 - 5.1 mmol/L 3.2  3.3  4.0   Chloride 98 - 111 mmol/L 107  102  95   CO2 22 - 32 mmol/L 23  23  22    Calcium  8.9 - 10.3 mg/dL 89.7  9.5  9.8      Author: Drue ONEIDA Potter, MD 04/10/2024 3:54 PM  For on call review [email or web address].  "

## 2024-04-10 NOTE — Plan of Care (Signed)

## 2024-04-10 NOTE — Telephone Encounter (Signed)
 Pharmacy Patient Advocate Encounter  Insurance verification completed.    The patient is insured through [employer]. Patient has Medicare and is not eligible for a copay card, but may be able to apply for patient assistance or Medicare RX Payment Plan (Patient Must reach out to their plan, if eligible for payment plan), if available.    Ran test claim for FSL 3Plus Sensor and the current 30 day co-pay is $0.   This test claim was processed through [employer]- copay amounts may vary at other pharmacies due to [employer], or as the patient moves through the different stages of their insurance plan.

## 2024-04-10 NOTE — TOC CM/SW Note (Signed)
 Transition of Care [facility]) CM/SW Note    Transition of Care [facility]) - Inpatient Brief Assessment   Patient Details  Name: Felicia Roberts MRN: [id number] Date of Birth: 01-25-1941  Transition of Care [facility]) CM/SW Contact:    Alfonso Rummer, LCSW Phone Number: 04/10/2024, 2:00 PM   Clinical Narrative:  Completed toc chart review. No toc needs at this time please contact toc should needs arise.   Transition of Care Asessment: Insurance and Status: Insurance coverage has been reviewed Patient has primary care physician: Yes JACQULIN, VISHWANATH) Home environment has been reviewed: single family home   Prior/Current Home Services: No current home services Social Drivers of Health Review: SDOH reviewed no interventions necessary Readmission risk has been reviewed: No Transition of care needs: no transition of care needs at this time

## 2024-04-10 NOTE — Plan of Care (Signed)

## 2024-04-10 NOTE — Progress Notes (Signed)
 Notified attending of glucose of 66 after orange juice and now patient eating ice cream. Will recheck glucose. Patient asymptomatic.

## 2024-04-10 NOTE — Inpatient Diabetes Management (Addendum)
 Inpatient Diabetes Program Recommendations  AACE/ADA: New Consensus Statement on Inpatient Glycemic Control (2015)  Target Ranges:  Prepandial:   less than 140 mg/dL      Peak postprandial:   less than 180 mg/dL (1-2 hours)      Critically ill patients:  140 - 180 mg/dL   Lab Results  Component Value Date   GLUCAP 234 (H) 04/10/2024   HGBA1C 9.3 (H) 11/11/2020    Latest Reference Range & Units 04/09/24 19:24 04/09/24 21:09 04/09/24 22:16 04/09/24 23:15 04/10/24 03:33 04/10/24 07:03 04/10/24 07:54  Glucose-Capillary 70 - 99 mg/dL 817 (H) 55 (L) 868 (H) 228 (H) Novolog  4 units 84 43 (LL) 234 (H)  (LL): Data is critically low (H): Data is abnormally high (L): Data is abnormally low  Latest Reference Range & Units 04/10/24 05:45  Glucose 70 - 99 mg/dL 41 (LL)  (LL): Data is critically low  Diabetes history: DM2 Outpatient Diabetes medications:  Lantus  14 units daily (added @ 04-07-24 appt) Glucotrol  20 mg daily Actos  30 mg daily Current orders for Inpatient glycemic control: Novolog  0-24 units q 4 hrs.  Inpatient Diabetes Program Recommendations:   Patient had hypoglycemia post Novolog  correction -Decrease Novolog  correction to 0-9 units tid, 0-5 units hs Patient was seen by endocrinologist Dr. Damian on 04-07-24.  Spoke with patient and daughter @ bedside. Patient has been taking the glucotrol  and Actos  and eating very little.  Discussed hypoglycemia, and normal levels of glucose fasting and postprandial.  MD ordered application of Freestyle CGM at discharge for patient. Education done regarding application and changing CGM sensor (alternate every 15 days on back of arms), 1 hour warm-up, use of glucometer when alert displays, how to scan CGM for glucose reading and information for PCP. Patient has also been given educational packet regarding use CGM sensor including the 1-800 toll free number for any questions, problems or needs related to the [facility] sensors or reader.     Sensor applied by patient to (L) Arm at 1:00 pm .  Explained that glucose readings will not be available until 1 hour after application. Reviewed use of CGM including how to scan, changing Sensor, Vitamin C warning, arrows with glucose readings, and Freestyle app. Patient and daughter very appreciative. Daughter plans to connect CGM with librelink to her phone to assist with keeping check on glucose levels. Left message @ Dr. Brigid office regarding patient is currently in hospital with hypoglycemia. Dr. Damian and nurse are out today but receptionist is sending a message. May consider on discharge: -Decrease Glucotrol  to 5 mg daily if eating -Decrease Lantus  to 8 units daily (0.15 units/kg x 54.4 kg) -Libre 3+ sensor [phone]   Thank you, Dagoberto BRAVO. Icel Castles, RN, MSN, CNS, CDCES  Diabetes Coordinator Inpatient Glycemic Control Team Team Pager [phone] (8am-5pm) 04/10/2024 8:41 AM

## 2024-04-11 DIAGNOSIS — E162 Hypoglycemia, unspecified: Secondary | ICD-10-CM | POA: Diagnosis not present

## 2024-04-11 LAB — BASIC METABOLIC PANEL WITH GFR
Anion gap: 9 (ref 5–15)
BUN: 17 mg/dL (ref 8–23)
CO2: 23 mmol/L (ref 22–32)
Calcium: 9.5 mg/dL (ref 8.9–10.3)
Chloride: 103 mmol/L (ref 98–111)
Creatinine, Ser: 1.33 mg/dL — ABNORMAL HIGH (ref 0.44–1.00)
GFR, Estimated: 40 mL/min — ABNORMAL LOW
Glucose, Bld: 180 mg/dL — ABNORMAL HIGH (ref 70–99)
Potassium: 4.4 mmol/L (ref 3.5–5.1)
Sodium: 136 mmol/L (ref 135–145)

## 2024-04-11 LAB — GLUCOSE, CAPILLARY
Glucose-Capillary: 152 mg/dL — ABNORMAL HIGH (ref 70–99)
Glucose-Capillary: 177 mg/dL — ABNORMAL HIGH (ref 70–99)
Glucose-Capillary: 201 mg/dL — ABNORMAL HIGH (ref 70–99)
Glucose-Capillary: 51 mg/dL — ABNORMAL LOW (ref 70–99)
Glucose-Capillary: 66 mg/dL — ABNORMAL LOW (ref 70–99)
Glucose-Capillary: 82 mg/dL (ref 70–99)

## 2024-04-11 MED ORDER — DEXTROSE 50 % IV SOLN
12.5000 g | INTRAVENOUS | Status: AC
  Administered 2024-04-11: 12.5 g via INTRAVENOUS
  Filled 2024-04-11: qty 50

## 2024-04-11 MED ORDER — GLIPIZIDE 5 MG PO TABS: 2.5000 mg | ORAL_TABLET | Freq: Every day | ORAL | 1 refills | Status: AC

## 2024-04-11 MED ORDER — LANTUS SOLOSTAR 100 UNIT/ML ~~LOC~~ SOPN: 8.0000 [IU] | PEN_INJECTOR | Freq: Every morning | SUBCUTANEOUS | 11 refills | Status: AC

## 2024-04-11 MED ORDER — FREESTYLE LIBRE 3 PLUS SENSOR MISC: 3 refills | Status: AC

## 2024-04-11 MED ORDER — ASPIRIN 81 MG PO TBEC: 81.0000 mg | DELAYED_RELEASE_TABLET | Freq: Every day | ORAL | 12 refills | Status: AC

## 2024-04-11 NOTE — Care Management Obs Status (Addendum)
 MEDICARE OBSERVATION STATUS NOTIFICATION   Patient Details  Name: Felicia Vanderford MRN: [id number] Date of Birth: February 19, 1941   Medicare Observation Status Notification Given:  Yes  Patient did not want interpreter. CM provided copy in Spanish and English  Delphine KANDICE Bring, RN 04/11/2024, 10:37 AM

## 2024-04-11 NOTE — Plan of Care (Signed)
" °  Problem: Nutrition: Goal: Adequate nutrition will be maintained Outcome: Progressing   Problem: Safety: Goal: Ability to remain free from injury will improve Outcome: Progressing   Problem: Education: Goal: Ability to describe self-care measures that may prevent or decrease complications (Diabetes Survival Skills Education) will improve Outcome: Progressing   "

## 2024-04-11 NOTE — Discharge Summary (Signed)
 " Physician Discharge Summary   Patient: Felicia Roberts MRN: [id number] DOB: 08-19-1940  Admit date:     04/09/2024  Discharge date: 04/11/24  Discharge Physician: Drue ONEIDA Potter   PCP: Sadie Manna, MD   Recommendations at discharge:  Follow-up with PCP  Discharge Diagnoses: Principal Problem:   Hypoglycemia  Resolved Problems:   * No resolved hospital problems. [facility] Hospital Course:  Brief hospital course: From HPI Felicia Roberts is a 84 y.o. female with medical history significant of type 2 diabetes mellitus who was initiated on insulin  glargine today.  She presents to the emergency room after family found her disoriented and confused.  When EMS was called to the home, blood glucose was noted to be in the 20s.  Patient apparently had been on oral hypoglycemics previously but had erratic blood glucose.  Last A1c was 13.6.   Endocrinologist recommended insulin  glargine after her last visit on 04/01/2024.  Her first dose apparently was earlier today.  She later was noted to be disoriented after family tried to reach her later in the afternoon.  When daughter got home, patient could barely follow commands to open the door.  She got her spare keys and was able to enter home.  Found patient to be impaired disoriented.  EMS was immediately called to the home.  Blood glucose checked at that time recorded to be in the 20s.  She was given a dose of 25 g of glucose.  Patient is back to baseline.   On presentation blood glucose was 222.  After few hours in the ED, blood glucose dropped again to 55.  Patient is now initiated on dextrose  fluids.  Will monitor blood glucose on current regimen.       Assessment and Plan:    Hypoglycemia: insulin  induced as well as taking high dose of glipizide  and not eating.   Hypoglycemia improved Dose of glipizide  decreased Input diabetic coordinator appreciated   Chronic type 2 diabetes mellitus: Last A1c was 13.6.   Currently hypoglycemic.      Chronic history of asthma:  Continue as needed inhalers   Chronic kidney disease stage IIIa.Stable Continue monitoring renal function   Hypokalemia: Continue repletion and monitoring     Consultants: Diabetic coordinator Procedures performed: None Disposition: Home Diet recommendation:  Cardiac and Carb modified diet DISCHARGE MEDICATION: Allergies as of 04/11/2024       Reactions   Farxiga  [dapagliflozin ] Anaphylaxis   Toviaz  [fesoterodine  Fumarate Er] Other (See Comments)   Memory loss   Januvia  [sitagliptin ] Rash        Medication List     STOP taking these medications    fluconazole 150 MG tablet Commonly known as: DIFLUCAN       TAKE these medications    Accu-Chek Guide Me w/Device Kit as directed.   albuterol  108 (90 Base) MCG/ACT inhaler Commonly known as: VENTOLIN  HFA INHALE ONE TO TWO PUFFS BY MOUTH EVERY 6 HOURS AS NEEDED FOR WHEEZING OR  SHORTNESS  OF  BREATH   amLODipine  5 MG tablet Commonly known as: NORVASC  Take 2 tablets (10 mg total) by mouth daily.   aspirin  EC 81 MG tablet Take 1 tablet (81 mg total) by mouth daily. Swallow whole. Start taking on: April 12, 2024   atorvastatin  40 MG tablet Commonly known as: LIPITOR Take 1 tablet (40 mg total) by mouth daily. What changed: how much to take   benzonatate  200 MG capsule Commonly known as: TESSALON  Take 200 mg by mouth 3 (three)  times daily as needed.   Breyna 160-4.5 MCG/ACT inhaler Generic drug: budesonide-formoterol Inhale 2 puffs into the lungs daily at 6 (six) AM.   fluticasone -salmeterol 250-50 MCG/ACT Aepb Commonly known as: ADVAIR Inhale 1 puff into the lungs in the morning and at bedtime.   folic acid 1 MG tablet Commonly known as: FOLVITE Take 1 mg by mouth daily.   FreeStyle Libre 3 Plus Sensor Misc Change sensor every 15 days.   glipiZIDE  5 MG tablet Commonly known as: GLUCOTROL  Take 0.5 tablets (2.5 mg total) by mouth daily before breakfast. Glipizide   XL What changed: how much to take   hydroxypropyl methylcellulose / hypromellose 2.5 % ophthalmic solution Commonly known as: ISOPTO TEARS / GONIOVISC Place 1 drop into both eyes 4 (four) times daily as needed for dry eyes.   Lantus  SoloStar 100 UNIT/ML Solostar Pen Generic drug: insulin  glargine Inject 8 Units into the skin every morning. Started medicine today What changed: how much to take   levothyroxine  50 MCG tablet Commonly known as: SYNTHROID  Take 1 tablet (50 mcg total) by mouth daily before breakfast.   losartan  50 MG tablet Commonly known as: Cozaar  Take 1 tablet (50 mg total) by mouth 2 (two) times daily for 10 days. What changed: Another medication with the same name was removed. Continue taking this medication, and follow the directions you see here.   methotrexate 2.5 MG tablet Commonly known as: RHEUMATREX Take 15 mg by mouth once a week.   miconazole  2 % cream Commonly known as: MICOTIN Apply 1 Application topically 2 (two) times daily.   montelukast 10 MG tablet Commonly known as: SINGULAIR Take 10 mg by mouth at bedtime.   omeprazole 20 MG capsule Commonly known as: PRILOSEC Take 20 mg by mouth every morning.   pantoprazole  20 MG tablet Commonly known as: PROTONIX  Take 20 mg by mouth daily as needed for heartburn or indigestion.   pioglitazone  30 MG tablet Commonly known as: ACTOS  Take 30 mg by mouth daily.   promethazine-dextromethorphan 6.25-15 MG/5ML syrup Commonly known as: PROMETHAZINE-DM Take 5 mLs by mouth every 8 (eight) hours as needed.   VITAMIN C GUMMIE PO Take by mouth.   VITAMIN D PO Take by mouth daily.   VITAMIN-B COMPLEX PO Take by mouth daily.        Discharge Exam: Felicia Roberts   04/09/24 1928  Weight: 54.4 kg   General: Elderly female laying in bed in no acute distress Neck: Supple with no JVD. Chest: Clinically clear with no added sounds. Cardiovascular: Regular S1-S2 with no murmur. Abdomen flat soft  nontender.  No organomegaly appreciated.  Bowel sounds present and normal Extremities without any pedal edema CNS without any focal deficits  Condition at discharge: good  The results of significant diagnostics from this hospitalization (including imaging, microbiology, ancillary and laboratory) are listed below for reference.   Imaging Studies: DG Chest 2 View Result Date: 04/01/2024 CLINICAL DATA:  Shortness of breath. EXAM: CHEST - 2 VIEW COMPARISON:  December 19, 2023 FINDINGS: The heart size and mediastinal contours are within normal limits. Very mild, stable atelectatic changes are suspected within the left lung base. No acute infiltrate, pleural effusion or pneumothorax is identified. The visualized skeletal structures are unremarkable. IMPRESSION: No active cardiopulmonary disease. Electronically Signed   By: Suzen Dials M.D.   On: 04/01/2024 12:22    Microbiology: Results for orders placed or performed during the hospital encounter of 12/19/23  Resp panel by RT-PCR (RSV, Flu A&B, Covid) Anterior Nasal  Swab     Status: None   Collection Time: 12/19/23 12:41 PM   Specimen: Anterior Nasal Swab  Result Value Ref Range Status   SARS Coronavirus 2 by RT PCR NEGATIVE NEGATIVE Final    Comment: (NOTE) SARS-CoV-2 target nucleic acids are NOT DETECTED.  The SARS-CoV-2 RNA is generally detectable in upper respiratory specimens during the acute phase of infection. The lowest concentration of SARS-CoV-2 viral copies this assay can detect is 138 copies/mL. A negative result does not preclude SARS-Cov-2 infection and should not be used as the sole basis for treatment or other patient management decisions. A negative result may occur with  improper specimen collection/handling, submission of specimen other than nasopharyngeal swab, presence of viral mutation(s) within the areas targeted by this assay, and inadequate number of viral copies(<138 copies/mL). A negative result must be  combined with clinical observations, patient history, and epidemiological information. The expected result is Negative.  Fact Sheet for Patients:  bloggercourse.com  Fact Sheet for Healthcare Providers:  seriousbroker.it  This test is no t yet approved or cleared by the [place]  FDA and  has been authorized for detection and/or diagnosis of SARS-CoV-2 by FDA under an Emergency Use Authorization (EUA). This EUA will remain  in effect (meaning this test can be used) for the duration of the COVID-19 declaration under Section 564(b)(1) of the Act, 21 U.S.C.section 360bbb-3(b)(1), unless the authorization is terminated  or revoked sooner.       Influenza A by PCR NEGATIVE NEGATIVE Final   Influenza B by PCR NEGATIVE NEGATIVE Final    Comment: (NOTE) The Xpert Xpress SARS-CoV-2/FLU/RSV plus assay is intended as an aid in the diagnosis of influenza from Nasopharyngeal swab specimens and should not be used as a sole basis for treatment. Nasal washings and aspirates are unacceptable for Xpert Xpress SARS-CoV-2/FLU/RSV testing.  Fact Sheet for Patients: bloggercourse.com  Fact Sheet for Healthcare Providers: seriousbroker.it  This test is not yet approved or cleared by the [place]  FDA and has been authorized for detection and/or diagnosis of SARS-CoV-2 by FDA under an Emergency Use Authorization (EUA). This EUA will remain in effect (meaning this test can be used) for the duration of the COVID-19 declaration under Section 564(b)(1) of the Act, 21 U.S.C. section 360bbb-3(b)(1), unless the authorization is terminated or revoked.     Resp Syncytial Virus by PCR NEGATIVE NEGATIVE Final    Comment: (NOTE) Fact Sheet for Patients: bloggercourse.com  Fact Sheet for Healthcare Providers: seriousbroker.it  This test is not yet  approved or cleared by the [place]  FDA and has been authorized for detection and/or diagnosis of SARS-CoV-2 by FDA under an Emergency Use Authorization (EUA). This EUA will remain in effect (meaning this test can be used) for the duration of the COVID-19 declaration under Section 564(b)(1) of the Act, 21 U.S.C. section 360bbb-3(b)(1), unless the authorization is terminated or revoked.  Performed at [facility], [address] [place], [place] [address]     Labs: CBC: Recent Labs  Lab 04/09/24 1928 04/10/24 0545  WBC 11.6* 8.7  NEUTROABS 8.9*  --   HGB 12.0 12.9  HCT 36.9 38.1  MCV 92.5 89.9  PLT 333 395   Basic Metabolic Panel: Recent Labs  Lab 04/09/24 1928 04/10/24 0545 04/11/24 1131  NA 138 141 136  K 3.3* 3.2* 4.4  CL 102 107 103  CO2 23 23 23   GLUCOSE 222* 41* 180*  BUN 24* 26* 17  CREATININE 1.14* 1.17* 1.33*  CALCIUM  9.5  10.2 9.5   Liver Function Tests: No results for input(s): AST, ALT, ALKPHOS, BILITOT, PROT, ALBUMIN in the last 168 hours. CBG: Recent Labs  Lab 04/11/24 0056 04/11/24 0349 04/11/24 0453 04/11/24 0807 04/11/24 1113  GLUCAP 82 51* 152* 66* 177*    Discharge time spent:  .  Signed: Drue ONEIDA Potter, MD [facility] 04/11/2024 "

## 2024-04-11 NOTE — Progress Notes (Signed)
 BG 119 on Dexacom
